# Patient Record
Sex: Female | Born: 1945 | Race: White | Hispanic: No | Smoking: Never smoker
Health system: Southern US, Community
[De-identification: ages and names within clinical notes are randomized; demographics above are authoritative.]

## PROBLEM LIST (undated history)

## (undated) DIAGNOSIS — D693 Immune thrombocytopenic purpura: Secondary | ICD-10-CM

## (undated) DIAGNOSIS — M810 Age-related osteoporosis without current pathological fracture: Secondary | ICD-10-CM

## (undated) DIAGNOSIS — H532 Diplopia: Secondary | ICD-10-CM

## (undated) DIAGNOSIS — E7800 Pure hypercholesterolemia, unspecified: Secondary | ICD-10-CM

## (undated) DIAGNOSIS — G629 Polyneuropathy, unspecified: Secondary | ICD-10-CM

## (undated) DIAGNOSIS — F419 Anxiety disorder, unspecified: Secondary | ICD-10-CM

## (undated) DIAGNOSIS — Z170 Estrogen receptor positive status [ER+]: Secondary | ICD-10-CM

## (undated) DIAGNOSIS — R42 Dizziness and giddiness: Secondary | ICD-10-CM

## (undated) DIAGNOSIS — M1990 Unspecified osteoarthritis, unspecified site: Secondary | ICD-10-CM

## (undated) DIAGNOSIS — R7303 Prediabetes: Secondary | ICD-10-CM

## (undated) DIAGNOSIS — C50412 Malignant neoplasm of upper-outer quadrant of left female breast: Secondary | ICD-10-CM

## (undated) HISTORY — DX: Malignant neoplasm of upper-outer quadrant of left female breast: C50.412

## (undated) HISTORY — DX: Anxiety disorder, unspecified: F41.9

## (undated) HISTORY — PX: EYE SURGERY: SHX253

## (undated) HISTORY — PX: SPLENECTOMY, TOTAL: SHX788

## (undated) HISTORY — DX: Diplopia: H53.2

## (undated) HISTORY — DX: Age-related osteoporosis without current pathological fracture: M81.0

## (undated) HISTORY — DX: Estrogen receptor positive status (ER+): Z17.0

## (undated) HISTORY — PX: BREAST LUMPECTOMY: SHX2

---

## 2004-02-28 ENCOUNTER — Ambulatory Visit: Payer: Self-pay | Admitting: Internal Medicine

## 2004-06-04 ENCOUNTER — Ambulatory Visit: Payer: Self-pay | Admitting: Internal Medicine

## 2004-06-30 ENCOUNTER — Ambulatory Visit: Payer: Self-pay | Admitting: Internal Medicine

## 2004-10-13 ENCOUNTER — Ambulatory Visit: Payer: Self-pay | Admitting: Internal Medicine

## 2004-10-28 ENCOUNTER — Ambulatory Visit: Payer: Self-pay | Admitting: Internal Medicine

## 2004-11-25 ENCOUNTER — Ambulatory Visit: Payer: Self-pay | Admitting: Gastroenterology

## 2005-04-12 ENCOUNTER — Ambulatory Visit: Payer: Self-pay | Admitting: Internal Medicine

## 2005-12-02 ENCOUNTER — Ambulatory Visit: Payer: Self-pay | Admitting: Gastroenterology

## 2007-02-28 ENCOUNTER — Ambulatory Visit: Payer: Self-pay | Admitting: Internal Medicine

## 2007-03-26 ENCOUNTER — Ambulatory Visit: Payer: Self-pay | Admitting: Internal Medicine

## 2007-03-31 ENCOUNTER — Ambulatory Visit: Payer: Self-pay | Admitting: Internal Medicine

## 2007-07-09 ENCOUNTER — Ambulatory Visit: Payer: Self-pay | Admitting: Unknown Physician Specialty

## 2008-07-21 ENCOUNTER — Ambulatory Visit: Payer: Self-pay | Admitting: Unknown Physician Specialty

## 2009-07-24 ENCOUNTER — Ambulatory Visit: Payer: Self-pay | Admitting: Unknown Physician Specialty

## 2010-08-05 ENCOUNTER — Ambulatory Visit: Payer: Self-pay | Admitting: Unknown Physician Specialty

## 2012-10-10 ENCOUNTER — Ambulatory Visit: Payer: Self-pay | Admitting: Family Medicine

## 2012-10-17 ENCOUNTER — Ambulatory Visit: Payer: Self-pay | Admitting: Family Medicine

## 2013-10-09 LAB — HM PAP SMEAR: HM PAP: NORMAL

## 2013-10-22 ENCOUNTER — Ambulatory Visit: Payer: Self-pay | Admitting: Family Medicine

## 2013-10-22 LAB — HM MAMMOGRAPHY: HM Mammogram: NORMAL

## 2014-04-22 ENCOUNTER — Ambulatory Visit: Payer: Self-pay | Admitting: Gastroenterology

## 2014-04-22 LAB — HM COLONOSCOPY

## 2014-08-04 LAB — HEPATIC FUNCTION PANEL
ALT: 16 U/L (ref 7–35)
AST: 19 U/L (ref 13–35)

## 2014-08-04 LAB — BASIC METABOLIC PANEL
BUN: 12 mg/dL (ref 4–21)
Creatinine: 0.7 mg/dL (ref <=1.1)
GLUCOSE: 95 mg/dL

## 2014-08-04 LAB — LIPID PANEL
Cholesterol: 212 mg/dL — AB (ref 0–200)
HDL: 58 mg/dL (ref 35–70)
LDL Cholesterol: 127 mg/dL
TRIGLYCERIDES: 136 mg/dL (ref 40–160)

## 2014-08-04 LAB — CBC AND DIFFERENTIAL: PLATELETS: 256 10*3/uL (ref 150–399)

## 2014-10-10 DIAGNOSIS — E7849 Other hyperlipidemia: Secondary | ICD-10-CM | POA: Insufficient documentation

## 2014-10-10 DIAGNOSIS — Z0000 Encounter for general adult medical examination without abnormal findings: Secondary | ICD-10-CM | POA: Insufficient documentation

## 2014-10-10 DIAGNOSIS — N393 Stress incontinence (female) (male): Secondary | ICD-10-CM | POA: Insufficient documentation

## 2014-10-10 DIAGNOSIS — D6832 Hemorrhagic disorder due to extrinsic circulating anticoagulants: Secondary | ICD-10-CM | POA: Insufficient documentation

## 2014-10-10 DIAGNOSIS — F410 Panic disorder [episodic paroxysmal anxiety] without agoraphobia: Secondary | ICD-10-CM | POA: Insufficient documentation

## 2014-10-10 DIAGNOSIS — K635 Polyp of colon: Secondary | ICD-10-CM | POA: Insufficient documentation

## 2014-10-13 ENCOUNTER — Other Ambulatory Visit: Payer: Self-pay | Admitting: Family Medicine

## 2014-10-13 DIAGNOSIS — Z1231 Encounter for screening mammogram for malignant neoplasm of breast: Secondary | ICD-10-CM

## 2014-10-15 ENCOUNTER — Other Ambulatory Visit: Payer: Self-pay | Admitting: Family Medicine

## 2014-10-15 DIAGNOSIS — Z780 Asymptomatic menopausal state: Secondary | ICD-10-CM

## 2014-11-11 ENCOUNTER — Ambulatory Visit: Payer: Self-pay

## 2014-11-11 ENCOUNTER — Ambulatory Visit
Admission: RE | Admit: 2014-11-11 | Discharge: 2014-11-11 | Disposition: A | Discharge status: Home / Self Care | Payer: Medicare Other | Source: Ambulatory Visit | Attending: Family Medicine | Admitting: Family Medicine

## 2014-11-11 DIAGNOSIS — Z780 Asymptomatic menopausal state: Secondary | ICD-10-CM | POA: Insufficient documentation

## 2014-11-11 DIAGNOSIS — Z1231 Encounter for screening mammogram for malignant neoplasm of breast: Secondary | ICD-10-CM

## 2014-12-05 ENCOUNTER — Other Ambulatory Visit: Payer: Self-pay | Admitting: Family Medicine

## 2014-12-05 DIAGNOSIS — Z79890 Hormone replacement therapy (postmenopausal): Secondary | ICD-10-CM

## 2014-12-05 DIAGNOSIS — R32 Unspecified urinary incontinence: Secondary | ICD-10-CM

## 2014-12-05 DIAGNOSIS — E785 Hyperlipidemia, unspecified: Secondary | ICD-10-CM

## 2015-03-13 ENCOUNTER — Other Ambulatory Visit: Payer: Self-pay | Admitting: Family Medicine

## 2015-06-28 ENCOUNTER — Ambulatory Visit
Admission: EM | Admit: 2015-06-28 | Discharge: 2015-06-28 | Disposition: A | Discharge status: Home / Self Care | Payer: Medicare Other | Attending: Family Medicine | Admitting: Family Medicine

## 2015-06-28 ENCOUNTER — Encounter: Payer: Self-pay | Admitting: Gynecology

## 2015-06-28 DIAGNOSIS — J029 Acute pharyngitis, unspecified: Secondary | ICD-10-CM

## 2015-06-28 DIAGNOSIS — J301 Allergic rhinitis due to pollen: Secondary | ICD-10-CM

## 2015-06-28 DIAGNOSIS — H6593 Unspecified nonsuppurative otitis media, bilateral: Secondary | ICD-10-CM | POA: Diagnosis not present

## 2015-06-28 HISTORY — DX: Immune thrombocytopenic purpura: D69.3

## 2015-06-28 HISTORY — DX: Polyneuropathy, unspecified: G62.9

## 2015-06-28 HISTORY — DX: Pure hypercholesterolemia, unspecified: E78.00

## 2015-06-28 LAB — RAPID STREP SCREEN (MED CTR MEBANE ONLY): Streptococcus, Group A Screen (Direct): NEGATIVE

## 2015-06-28 MED ORDER — SALINE SPRAY 0.65 % NA SOLN: 2.0000 | NASAL | Status: DC

## 2015-06-28 MED ORDER — FLUTICASONE PROPIONATE 50 MCG/ACT NA SUSP: 1.0000 | Freq: Two times a day (BID) | NASAL | Status: DC

## 2015-06-28 MED ORDER — AMOXICILLIN-POT CLAVULANATE 875-125 MG PO TABS: 1.0000 | ORAL_TABLET | Freq: Two times a day (BID) | ORAL | Status: DC

## 2015-06-28 MED ORDER — CETIRIZINE HCL 1 MG/ML PO SYRP: 10.0000 mg | ORAL_SOLUTION | Freq: Every day | ORAL | Status: DC

## 2015-06-28 MED ORDER — ACETAMINOPHEN 500 MG PO TABS: 1000.0000 mg | ORAL_TABLET | Freq: Four times a day (QID) | ORAL | Status: AC | PRN

## 2015-06-28 NOTE — ED Notes (Signed)
Please c/o sore throat x 4 days.

## 2015-06-28 NOTE — ED Provider Notes (Addendum)
CSN: [id number]     Arrival date & time 06/28/15  1442 History   First MD Initiated Contact with Patient 06/28/15 1517     Chief Complaint  Patient presents with  . Sore Throat   (Consider location/radiation/quality/duration/timing/severity/associated sxs/prior Treatment) HPI Comments: Widowed caucasian female here for evaluation of sore throat (mild honey helped), cough green productive today minimal.  Traveling out of town in 2 days to care for elderly aunt driving to destination wanted to ensure she wasn't contagious.  Denied sick contacts works at [employer] as Psychologist, occupational.  Allergies sulfa and macrobid, PMHx ITP, hyperlipidemia, anxiety PSHx splenectomy FHx colon cancer, heart problems  Patient is a 70 y.o. female presenting with pharyngitis. The history is provided by the patient.  Sore Throat Associated symptoms include headaches. Pertinent negatives include no chest pain, no abdominal pain and no shortness of breath.    Past Medical History  Diagnosis Date  . ITP (idiopathic thrombocytopenic purpura)   . Hypercholesteremia   . Neuropathy (Circle)     bilateral feet   Past Surgical History  Procedure Laterality Date  . Splenectomy, total     History reviewed. No pertinent family history. Social History  Substance Use Topics  . Smoking status: Never Smoker   . Smokeless tobacco: None  . Alcohol Use: No   OB History    No data available     Review of Systems  Constitutional: Negative for fever, chills, diaphoresis, activity change, appetite change, fatigue and unexpected weight change.  HENT: Positive for postnasal drip and sore throat. Negative for congestion, dental problem, drooling, ear discharge, ear pain, facial swelling, hearing loss, mouth sores, nosebleeds, rhinorrhea, sinus pressure, sneezing, tinnitus, trouble swallowing and voice change.   Eyes: Negative for photophobia, pain, discharge, redness, itching and visual disturbance.  Respiratory:  Positive for cough. Negative for choking, chest tightness, shortness of breath, wheezing and stridor.   Cardiovascular: Negative for chest pain, palpitations and leg swelling.  Gastrointestinal: Negative for nausea, vomiting, abdominal pain, diarrhea, constipation, blood in stool and abdominal distention.  Endocrine: Negative for cold intolerance and heat intolerance.  Genitourinary: Negative for dysuria, hematuria and difficulty urinating.  Musculoskeletal: Negative for myalgias, back pain, joint swelling, arthralgias, gait problem, neck pain and neck stiffness.  Skin: Negative for color change, pallor, rash and wound.  Allergic/Immunologic: Positive for environmental allergies. Negative for food allergies.  Neurological: Positive for headaches. Negative for dizziness, tremors, seizures, syncope, facial asymmetry, speech difficulty, weakness, light-headedness and numbness.  Hematological: Negative for adenopathy. Does not bruise/bleed easily.  Psychiatric/Behavioral: Negative for behavioral problems, confusion, sleep disturbance and agitation.    Allergies  Macrobid and Sulfa antibiotics  Home Medications   Prior to Admission medications   Medication Sig Start Date End Date Taking? Authorizing Provider  estradiol-norethindrone (ACTIVELLA) 1-0.5 MG per tablet TAKE (1) TABLET BY MOUTH EVERY DAY 12/05/14  Yes Juline Patch, MD  gabapentin (NEURONTIN) 300 MG capsule Take 1 capsule by mouth 2 (two) times daily. Manuella Ghazi 08/21/14  Yes Historical Provider, MD  Magnesium 100 MG CAPS Take 1 capsule by mouth daily.   Yes Historical Provider, MD  oxybutynin (DITROPAN-XL) 5 MG 24 hr tablet TAKE (1) TABLET BY MOUTH TWICE DAILY 03/13/15  Yes Juline Patch, MD  simvastatin (ZOCOR) 20 MG tablet TAKE (1) TABLET BY MOUTH DAILY AT BEDTIME 03/13/15  Yes Juline Patch, MD  acetaminophen (TYLENOL) 500 MG tablet Take 2 tablets (1,000 mg total) by mouth every 6 (six) hours as needed for  mild pain, moderate pain or  fever. 06/28/15 07/03/15  Olen Cordial, NP  ALPRAZolam Duanne Moron) 0.25 MG tablet Take 1 tablet by mouth as needed. 08/06/14   Historical Provider, MD  amoxicillin-clavulanate (AUGMENTIN) 875-125 MG tablet Take 1 tablet by mouth every 12 (twelve) hours. 06/28/15   Olen Cordial, NP  cetirizine (ZYRTEC) 1 MG/ML syrup Take 10 mLs (10 mg total) by mouth daily. 06/28/15 07/04/15  Olen Cordial, NP  fluticasone (FLONASE) 50 MCG/ACT nasal spray Place 1 spray into both nostrils 2 (two) times daily. 06/28/15   Olen Cordial, NP  sodium chloride (OCEAN) 0.65 % SOLN nasal spray Place 2 sprays into both nostrils every 2 (two) hours while awake. 06/28/15   Olen Cordial, NP   Meds Ordered and Administered this Visit  Medications - No data to display  BP 125/97 mmHg  Pulse 80  Temp(Src) 98.3 F (36.8 C) (Oral)  Resp 15  Ht 5\' 6"  (1.676 m)  Wt 150 lb (68.04 kg)  BMI 24.22 kg/m2  SpO2 97% No data found.   Physical Exam  Constitutional: She is oriented to person, place, and time. She appears well-developed and well-nourished. She is active and cooperative.  Non-toxic appearance. She does not have a sickly appearance. She does not appear ill. No distress.  HENT:  Head: Normocephalic and atraumatic.  Right Ear: Hearing, external ear and ear canal normal. A middle ear effusion is present.  Left Ear: Hearing, external ear and ear canal normal. A middle ear effusion is present.  Nose: Mucosal edema and rhinorrhea present. No nose lacerations, sinus tenderness, nasal deformity, septal deviation or nasal septal hematoma. No epistaxis.  No foreign bodies. Right sinus exhibits no maxillary sinus tenderness and no frontal sinus tenderness. Left sinus exhibits no maxillary sinus tenderness and no frontal sinus tenderness.  Mouth/Throat: Uvula is midline and mucous membranes are normal. Mucous membranes are not pale, not dry and not cyanotic. She does not have dentures. No oral lesions. No trismus in the jaw.  Normal dentition. No dental abscesses, uvula swelling, lacerations or dental caries. Posterior oropharyngeal edema and posterior oropharyngeal erythema present. No oropharyngeal exudate or tonsillar abscesses.  Cobblestoning posterior pharynx; bilateral TMs with air fluid level clear; bilateral nasal turbinates with edema/erythema/yellow discharge  Eyes: Conjunctivae, EOM and lids are normal. Pupils are equal, round, and reactive to light. Right eye exhibits no chemosis, no discharge, no exudate and no hordeolum. No foreign body present in the right eye. Left eye exhibits no chemosis, no discharge, no exudate and no hordeolum. No foreign body present in the left eye. Right conjunctiva is not injected. Right conjunctiva has no hemorrhage. Left conjunctiva is not injected. Left conjunctiva has no hemorrhage. No scleral icterus. Right eye exhibits normal extraocular motion and no nystagmus. Left eye exhibits normal extraocular motion and no nystagmus. Right pupil is round and reactive. Left pupil is round and reactive. Pupils are equal.  Neck: Trachea normal and normal range of motion. Neck supple. No tracheal tenderness, no spinous process tenderness and no muscular tenderness present. No rigidity. No tracheal deviation, no edema, no erythema and normal range of motion present. No thyroid mass and no thyromegaly present.  Cardiovascular: Normal rate, regular rhythm, S1 normal, S2 normal, normal heart sounds and intact distal pulses.  PMI is not displaced.  Exam reveals no gallop and no friction rub.   No murmur heard. Pulmonary/Chest: Effort normal and breath sounds normal. No accessory muscle usage or stridor. No respiratory distress. She has  no decreased breath sounds. She has no wheezes. She has no rhonchi. She has no rales. She exhibits no tenderness.  Abdominal: Soft. She exhibits no distension.  Musculoskeletal: Normal range of motion. She exhibits no edema or tenderness.       Right shoulder: Normal.        Left shoulder: Normal.       Right hip: Normal.       Left hip: Normal.       Right knee: Normal.       Left knee: Normal.       Cervical back: Normal.       Right hand: Normal.       Left hand: Normal.  Lymphadenopathy:       Head (right side): No submental, no submandibular, no tonsillar, no preauricular, no posterior auricular and no occipital adenopathy present.       Head (left side): No submental, no submandibular, no tonsillar, no preauricular, no posterior auricular and no occipital adenopathy present.    She has no cervical adenopathy.       Right cervical: No superficial cervical, no deep cervical and no posterior cervical adenopathy present.      Left cervical: No superficial cervical, no deep cervical and no posterior cervical adenopathy present.  Neurological: She is alert and oriented to person, place, and time. She has normal strength. She is not disoriented. She displays no atrophy and no tremor. No cranial nerve deficit or sensory deficit. She exhibits normal muscle tone. She displays no seizure activity. Coordination and gait normal. GCS eye subscore is 4. GCS verbal subscore is 5. GCS motor subscore is 6.  Skin: Skin is warm, dry and intact. No abrasion, no bruising, no burn, no ecchymosis, no laceration, no lesion, no petechiae and no rash noted. She is not diaphoretic. No cyanosis or erythema. No pallor. Nails show no clubbing.  Psychiatric: She has a normal mood and affect. Her speech is normal and behavior is normal. Judgment and thought content normal. Cognition and memory are normal.  Nursing note and vitals reviewed.   ED Course  Procedures (including critical care time)  Labs Review Labs Reviewed  RAPID STREP SCREEN (NOT AT [facility])  CULTURE, GROUP A STREP [facility])    Imaging Review No results found.  1530 discussed rapid strep negative throat culture pending typically 48 hours will call with results once available.  Patient given paper rx for augmentin  875mg  po BID x 10 days in case throat culture positive as traveling Tuesday out of town.  Patient verbalized understanding of information/instructions, agreed with plan of care and had no further questions at this time.  MDM   1. Acute pharyngitis, unspecified pharyngitis type   2. Otitis media with effusion, bilateral   3. Allergic rhinitis due to pollen    Supportive treatment.   No evidence of invasive bacterial infection, non toxic and well hydrated.  This is most likely self limiting viral infection.  I do not see where any further testing or imaging is necessary at this time.   I will suggest supportive care, rest, good hygiene and encourage the patient to take adequate fluids.  The patient is to return to clinic or EMERGENCY ROOM if symptoms worsen or change significantly e.g. ear pain, fever, purulent discharge from ears or bleeding.  Exitcare handout on otitis media with effusion given to patient.  Patient verbalized agreement and understanding of treatment plan.    Patient notified rapid strep negative.  Suspect Viral illness:  no evidence of invasive bacterial infection, non toxic and well hydrated.  This is most likely self limiting viral infection.  I do not see where any further testing or imaging is necessary at this time.   I will suggest supportive care, rest, good hygiene and encourage the patient to take adequate fluids.  Does not require work excuse.  Notified patient staff will call with culture results once available next 48+ hours. flonase 1 spray each nostril BID prn, nasal saline 1-2 sprays each nostril prn q2h, tylenol 1000mg  po QID prn avoid motrin/naproxen as can raise blood pressure  Zyrtec 10mg  po daily discussed can buy children's liquid and gargle swallow to help with sore throat.  Discussed honey with lemon and salt water gargles for comfort also.  The patient is to return to clinic or EMERGENCY ROOM if symptoms worsen or change significantly e.g. fever, lethargy, SOB,  wheezing.  Exitcare handout on viral illness given to patient.  Patient verbalized agreement and understanding of treatment plan.     Usually no specific medical treatment is needed if a virus is causing the sore throat.  The throat most often gets better on its own within 5 to 7 days.  Antibiotic medicine does not cure viral pharyngitis.   For acute pharyngitis caused by bacteria, your healthcare provider will prescribe an antibiotic.  Marland Kitchen Do not smoke.  Marland Kitchen Avoid secondhand smoke and other air pollutants.  . Use a cool mist humidifier to add moisture to the air.  . Get plenty of rest.  . You may want to rest your throat by talking less and eating a diet that is mostly liquid or soft for a day or two.   Marland Kitchen Nonprescription throat lozenges and mouthwashes should help relieve the soreness.   . Gargling with warm saltwater and drinking warm liquids may help.  (You can make a saltwater solution by adding 1/4 teaspoon of salt to 8 ounces, or 240 mL, of warm water.)  . A nonprescription pain reliever such as aspirin, acetaminophen, or ibuprofen may ease general aches and pains.   FOLLOW UP with clinic provider if no improvements in the next 7-10 days.  Exitcare handout on pharyngitis given to patient.  Patient verbalized understanding of instructions and agreed with plan of care. P2:  Hand washing and diet.  Plants budding in local area due to mild winter.  Patient with seasonal allergies past couple of years.  Typically doesn't take antihistamine.  Patient may use normal saline nasal spray as needed.  Consider antihistamine or nasal steroid use.  Avoid triggers if possible.  Shower prior to bedtime if exposed to triggers.  If allergic dust/dust mites recommend mattress/pillow covers/encasements; washing linens, vacuuming, sweeping, dusting weekly.  Call or return to clinic as needed if these symptoms worsen or fail to improve as anticipated.   Exitcare handout on allergic rhinitis given to patient.  Patient  verbalized understanding of instructions, agreed with plan of care and had no further questions at this time.  P2:  Avoidance and hand washing.   Olen Cordial, NP 06/28/15 1714  02 Jul 2015 1800 patient returned call stated was feeling better but hoarse voice again, nasal congestion and sore throat resolved but now nonproductive cough worse with lying down.  [place] and just returned home tonight.  Discussed if no improvement with flonase 1 spray each nostril BID, honey with lemon. claritin 10mg  po daily x 48 hours fill augmentin 875mg   po BID and start for sinusitis as postnasal drip continues despite antihistamine, nasal steroid, saline.  Patient to use humidifier also, OTC cough suppressant.  Patient verbalized understanding of information/instructions, agreed with plan of care and had no further questions at this time.  Olen Cordial, NP 07/02/15 [phone]

## 2015-06-28 NOTE — Discharge Instructions (Signed)
Allergic Rhinitis Allergic rhinitis is when the mucous membranes in the nose respond to allergens. Allergens are particles in the air that cause your body to have an allergic reaction. This causes you to release allergic antibodies. Through a chain of events, these eventually cause you to release histamine into the blood stream. Although meant to protect the body, it is this release of histamine that causes your discomfort, such as frequent sneezing, congestion, and an itchy, runny nose.  CAUSES Seasonal allergic rhinitis (hay fever) is caused by pollen allergens that may come from grasses, trees, and weeds. Year-round allergic rhinitis (perennial allergic rhinitis) is caused by allergens such as house dust mites, pet dander, and mold spores. SYMPTOMS  Nasal stuffiness (congestion).  Itchy, runny nose with sneezing and tearing of the eyes. DIAGNOSIS Your health care provider can help you determine the allergen or allergens that trigger your symptoms. If you and your health care provider are unable to determine the allergen, skin or blood testing may be used. Your health care provider will diagnose your condition after taking your health history and performing a physical exam. Your health care provider may assess you for other related conditions, such as asthma, pink eye, or an ear infection. TREATMENT Allergic rhinitis does not have a cure, but it can be controlled by:  Medicines that block allergy symptoms. These may include allergy shots, nasal sprays, and oral antihistamines.  Avoiding the allergen. Hay fever may often be treated with antihistamines in pill or nasal spray forms. Antihistamines block the effects of histamine. There are over-the-counter medicines that may help with nasal congestion and swelling around the eyes. Check with your health care provider before taking or giving this medicine. If avoiding the allergen or the medicine prescribed do not work, there are many new medicines  your health care provider can prescribe. Stronger medicine may be used if initial measures are ineffective. Desensitizing injections can be used if medicine and avoidance does not work. Desensitization is when a patient is given ongoing shots until the body becomes less sensitive to the allergen. Make sure you follow up with your health care provider if problems continue. HOME CARE INSTRUCTIONS It is not possible to completely avoid allergens, but you can reduce your symptoms by taking steps to limit your exposure to them. It helps to know exactly what you are allergic to so that you can avoid your specific triggers. SEEK MEDICAL CARE IF:  You have a fever.  You develop a cough that does not stop easily (persistent).  You have shortness of breath.  You start wheezing.  Symptoms interfere with normal daily activities.   This information is not intended to replace advice given to you by your health care provider. Make sure you discuss any questions you have with your health care provider.   Document Released: 02/08/2001 Document Revised: 06/06/2014 Document Reviewed: 01/21/2013 Elsevier Interactive Patient Education 2016 [place]. Otitis Media With Effusion Otitis media with effusion is the presence of fluid in the middle ear. This is a common problem in children, which often follows ear infections. It may be present for weeks or longer after the infection. Unlike an acute ear infection, otitis media with effusion refers only to fluid behind the ear drum and not infection. Children with repeated ear and sinus infections and allergy problems are the most likely to get otitis media with effusion. CAUSES  The most frequent cause of the fluid buildup is dysfunction of the eustachian tubes. These are the tubes that drain fluid  in the ears to the back of the nose (nasopharynx). SYMPTOMS   The main symptom of this condition is hearing loss. As a result, you or your child may:  Listen to the TV  at a loud volume.  Not respond to questions.  Ask "what" often when spoken to.  Mistake or confuse one sound or word for another.  There may be a sensation of fullness or pressure but usually not pain. DIAGNOSIS   Your health care provider will diagnose this condition by examining you or your child's ears.  Your health care provider may test the pressure in you or your child's ear with a tympanometer.  A hearing test may be conducted if the problem persists. TREATMENT   Treatment depends on the duration and the effects of the effusion.  Antibiotics, decongestants, nose drops, and cortisone-type drugs (tablets or nasal spray) may not be helpful.  Children with persistent ear effusions may have delayed language or behavioral problems. Children at risk for developmental delays in hearing, learning, and speech may require referral to a specialist earlier than children not at risk.  You or your child's health care provider may suggest a referral to an ear, nose, and throat surgeon for treatment. The following may help restore normal hearing:  Drainage of fluid.  Placement of ear tubes (tympanostomy tubes).  Removal of adenoids (adenoidectomy). HOME CARE INSTRUCTIONS   Avoid secondhand smoke.  Infants who are breastfed are less likely to have this condition.  Avoid feeding infants while they are lying flat.  Avoid known environmental allergens.  Avoid people who are sick. SEEK MEDICAL CARE IF:   Hearing is not better in 3 months.  Hearing is worse.  Ear pain.  Drainage from the ear.  Dizziness. MAKE SURE YOU:   Understand these instructions.  Will watch your condition.  Will get help right away if you are not doing well or get worse.   This information is not intended to replace advice given to you by your health care provider. Make sure you discuss any questions you have with your health care provider.   Document Released: 06/23/2004 Document Revised:  06/06/2014 Document Reviewed: 12/11/2012 Elsevier Interactive Patient Education 2016 [employer] Pharyngitis Pharyngitis is redness, pain, and swelling (inflammation) of your pharynx.  CAUSES  Pharyngitis is usually caused by infection. Most of the time, these infections are from viruses (viral) and are part of a cold. However, sometimes pharyngitis is caused by bacteria (bacterial). Pharyngitis can also be caused by allergies. Viral pharyngitis may be spread from person to person by coughing, sneezing, and personal items or utensils (cups, forks, spoons, toothbrushes). Bacterial pharyngitis may be spread from person to person by more intimate contact, such as kissing.  SIGNS AND SYMPTOMS  Symptoms of pharyngitis include:   Sore throat.   Tiredness (fatigue).   Low-grade fever.   Headache.  Joint pain and muscle aches.  Skin rashes.  Swollen lymph nodes.  Plaque-like film on throat or tonsils (often seen with bacterial pharyngitis). DIAGNOSIS  Your health care provider will ask you questions about your illness and your symptoms. Your medical history, along with a physical exam, is often all that is needed to diagnose pharyngitis. Sometimes, a rapid strep test is done. Other lab tests may also be done, depending on the suspected cause.  TREATMENT  Viral pharyngitis will usually get better in 3-4 days without the use of medicine. Bacterial pharyngitis is treated with medicines that kill germs (antibiotics).  HOME CARE INSTRUCTIONS  Drink enough water and fluids to keep your urine clear or pale yellow.   Only take over-the-counter or prescription medicines as directed by your health care provider:   If you are prescribed antibiotics, make sure you finish them even if you start to feel better.   Do not take aspirin.   Get lots of rest.   Gargle with 8 oz of salt water ( tsp of salt per 1 qt of water) as often as every 1-2 hours to soothe your throat.   Throat  lozenges (if you are not at risk for choking) or sprays may be used to soothe your throat. SEEK MEDICAL CARE IF:   You have large, tender lumps in your neck.  You have a rash.  You cough up green, yellow-brown, or bloody spit. SEEK IMMEDIATE MEDICAL CARE IF:   Your neck becomes stiff.  You drool or are unable to swallow liquids.  You vomit or are unable to keep medicines or liquids down.  You have severe pain that does not go away with the use of recommended medicines.  You have trouble breathing (not caused by a stuffy nose). MAKE SURE YOU:   Understand these instructions.  Will watch your condition.  Will get help right away if you are not doing well or get worse.   This information is not intended to replace advice given to you by your health care provider. Make sure you discuss any questions you have with your health care provider.   Document Released: 05/16/2005 Document Revised: 03/06/2013 Document Reviewed: 01/21/2013 Elsevier Interactive Patient Education [employer].

## 2015-06-30 LAB — CULTURE, GROUP A STREP (THRC)

## 2015-07-02 ENCOUNTER — Telehealth: Payer: Self-pay | Admitting: Family Medicine

## 2015-07-02 NOTE — Telephone Encounter (Signed)
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  Left message today throat culture normal/negative for strep throat.  To contact clinic if further questions or concerns

## 2015-07-03 DIAGNOSIS — H2513 Age-related nuclear cataract, bilateral: Secondary | ICD-10-CM | POA: Diagnosis not present

## 2015-07-09 ENCOUNTER — Encounter: Payer: Self-pay | Admitting: Family Medicine

## 2015-07-09 ENCOUNTER — Ambulatory Visit (INDEPENDENT_AMBULATORY_CARE_PROVIDER_SITE_OTHER): Payer: Medicare Other | Admitting: Family Medicine

## 2015-07-09 VITALS — BP 110/62 | HR 80 | Ht 66.0 in | Wt 151.0 lb

## 2015-07-09 DIAGNOSIS — S00532A Contusion of oral cavity, initial encounter: Secondary | ICD-10-CM | POA: Diagnosis not present

## 2015-07-09 DIAGNOSIS — D693 Immune thrombocytopenic purpura: Secondary | ICD-10-CM

## 2015-07-09 DIAGNOSIS — J0100 Acute maxillary sinusitis, unspecified: Secondary | ICD-10-CM

## 2015-07-09 DIAGNOSIS — B009 Herpesviral infection, unspecified: Secondary | ICD-10-CM

## 2015-07-09 MED ORDER — AMOXICILLIN 500 MG PO CAPS: 500.0000 mg | ORAL_CAPSULE | Freq: Three times a day (TID) | ORAL | Status: DC

## 2015-07-09 MED ORDER — ACYCLOVIR 200 MG PO CAPS: 200.0000 mg | ORAL_CAPSULE | Freq: Two times a day (BID) | ORAL | Status: DC

## 2015-07-09 NOTE — Progress Notes (Signed)
Name: Melissa Daniels   MRN: [id number]    DOB: 1945/08/01   Date:07/09/2015       Progress Note  Subjective  Chief Complaint  Chief Complaint  Patient presents with  . Oral Swelling    has a swollen place on L) side of lower lip- a little sore to touch    Oral Pain  This is a new problem. The current episode started today. The problem has been waxing and waning. The pain is mild. Associated symptoms include sinus pressure. Pertinent negatives include no difficulty swallowing, facial pain, fever or oral bleeding. She has tried acetaminophen for the symptoms. The treatment provided mild relief.    No problem-specific assessment & plan notes found for this encounter.   Past Medical History  Diagnosis Date  . ITP (idiopathic thrombocytopenic purpura)   . Hypercholesteremia   . 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 Osteoporosis     Past Surgical History  Procedure Laterality Date  . Splenectomy, total      History reviewed. No pertinent family history.  Social History   Social History  . Marital Status: Widowed    Spouse Name: N/A  . Number of Children: N/A  . Years of Education: N/A   Occupational History  . Not on file.   Social History Main Topics  . Smoking status: Never Smoker   . Smokeless tobacco: Not on file  . Alcohol Use: No  . Drug Use: No  . Sexual Activity: Not Currently   Other Topics Concern  . Not on file   Social History Narrative    Allergies  Allergen Reactions  . Macrobid [Nitrofurantoin]   . Sulfa Antibiotics      Review of Systems  Constitutional: Negative for fever, chills, weight loss and malaise/fatigue.  HENT: Positive for sinus pressure. Negative for ear discharge, ear pain and sore throat.   Eyes: Negative for blurred vision.  Respiratory: Negative for cough, sputum production, shortness of breath and wheezing.   Cardiovascular: Negative for chest pain, palpitations and leg swelling.  Gastrointestinal: Negative for heartburn,  nausea, abdominal pain, diarrhea, constipation, blood in stool and melena.  Genitourinary: Negative for dysuria, urgency, frequency and hematuria.  Musculoskeletal: Negative for myalgias, back pain, joint pain and neck pain.  Skin: Negative for rash.  Neurological: Negative for dizziness, tingling, sensory change, focal weakness and headaches.  Endo/Heme/Allergies: Negative for environmental allergies and polydipsia. Does not bruise/bleed easily.  Psychiatric/Behavioral: Negative for depression and suicidal ideas. The patient is not nervous/anxious and does not have insomnia.      Objective  Filed Vitals:   07/09/15 1446  BP: 110/62  Pulse: 80  Height: 5\' 6"  (1.676 m)  Weight: 151 lb (68.493 kg)    Physical Exam  Constitutional: She is well-developed, well-nourished, and in no distress. No distress.  HENT:  Head: Normocephalic and atraumatic.  Right Ear: External ear normal.  Left Ear: External ear normal.  Nose: Mucosal edema present.  Mouth/Throat: Oropharynx is clear and moist. Oral lesions present.  Swelling lingual left lower / nontender  Eyes: Conjunctivae and EOM are normal. Pupils are equal, round, and reactive to light. Right eye exhibits no discharge. Left eye exhibits no discharge.  Neck: Normal range of motion. Neck supple. No JVD present. No thyromegaly present.  Cardiovascular: Normal rate, regular rhythm, normal heart sounds and intact distal pulses.  Exam reveals no gallop and no friction rub.   No murmur heard. Pulmonary/Chest: Effort normal and breath sounds normal.  Abdominal: Soft. Bowel sounds are normal. She exhibits no mass. There is no tenderness. There is no guarding.  Musculoskeletal: Normal range of motion. She exhibits no edema.  Lymphadenopathy:    She has no cervical adenopathy.  Neurological: She is alert. She has normal reflexes.  Skin: Skin is warm and dry. She is not diaphoretic.  Psychiatric: Mood and affect normal.  Nursing note and vitals  reviewed.     Assessment & Plan  Problem List Items Addressed This Visit    None    Visit Diagnoses    Contusion of oral cavity, initial encounter    -  Primary    ice to mucosal aspect    Herpes simplex        Relevant Medications    acyclovir (ZOVIRAX) 200 MG capsule    Subacute maxillary sinusitis        Relevant Medications    amoxicillin (AMOXIL) 500 MG capsule    acyclovir (ZOVIRAX) 200 MG capsule    ITP (idiopathic thrombocytopenic purpura)             Dr. Eudora Guevarra [facility]  07/09/2015

## 2015-08-10 DIAGNOSIS — L821 Other seborrheic keratosis: Secondary | ICD-10-CM | POA: Diagnosis not present

## 2015-08-10 DIAGNOSIS — L570 Actinic keratosis: Secondary | ICD-10-CM | POA: Diagnosis not present

## 2015-08-10 DIAGNOSIS — L718 Other rosacea: Secondary | ICD-10-CM | POA: Diagnosis not present

## 2015-08-10 DIAGNOSIS — Z1283 Encounter for screening for malignant neoplasm of skin: Secondary | ICD-10-CM | POA: Diagnosis not present

## 2015-10-19 ENCOUNTER — Ambulatory Visit
Admission: RE | Admit: 2015-10-19 | Discharge: 2015-10-19 | Disposition: A | Discharge status: Home / Self Care | Payer: Medicare Other | Source: Ambulatory Visit | Attending: Family Medicine | Admitting: Family Medicine

## 2015-10-19 ENCOUNTER — Ambulatory Visit (INDEPENDENT_AMBULATORY_CARE_PROVIDER_SITE_OTHER): Payer: Medicare Other | Admitting: Family Medicine

## 2015-10-19 ENCOUNTER — Encounter: Payer: Self-pay | Admitting: Family Medicine

## 2015-10-19 VITALS — BP 130/70 | HR 76 | Ht 66.0 in | Wt 150.0 lb

## 2015-10-19 DIAGNOSIS — N393 Stress incontinence (female) (male): Secondary | ICD-10-CM

## 2015-10-19 DIAGNOSIS — I10 Essential (primary) hypertension: Secondary | ICD-10-CM | POA: Diagnosis not present

## 2015-10-19 DIAGNOSIS — R69 Illness, unspecified: Secondary | ICD-10-CM | POA: Diagnosis not present

## 2015-10-19 DIAGNOSIS — F410 Panic disorder [episodic paroxysmal anxiety] without agoraphobia: Secondary | ICD-10-CM

## 2015-10-19 DIAGNOSIS — Z1239 Encounter for other screening for malignant neoplasm of breast: Secondary | ICD-10-CM | POA: Diagnosis not present

## 2015-10-19 DIAGNOSIS — M79604 Pain in right leg: Secondary | ICD-10-CM

## 2015-10-19 DIAGNOSIS — E784 Other hyperlipidemia: Secondary | ICD-10-CM | POA: Diagnosis not present

## 2015-10-19 DIAGNOSIS — D693 Immune thrombocytopenic purpura: Secondary | ICD-10-CM

## 2015-10-19 DIAGNOSIS — E7849 Other hyperlipidemia: Secondary | ICD-10-CM

## 2015-10-19 DIAGNOSIS — Z23 Encounter for immunization: Secondary | ICD-10-CM | POA: Diagnosis not present

## 2015-10-19 DIAGNOSIS — Z780 Asymptomatic menopausal state: Secondary | ICD-10-CM

## 2015-10-19 DIAGNOSIS — Z0000 Encounter for general adult medical examination without abnormal findings: Secondary | ICD-10-CM | POA: Diagnosis not present

## 2015-10-19 DIAGNOSIS — M1611 Unilateral primary osteoarthritis, right hip: Secondary | ICD-10-CM | POA: Diagnosis not present

## 2015-10-19 DIAGNOSIS — Z79890 Hormone replacement therapy (postmenopausal): Secondary | ICD-10-CM | POA: Insufficient documentation

## 2015-10-19 DIAGNOSIS — W57XXXA Bitten or stung by nonvenomous insect and other nonvenomous arthropods, initial encounter: Secondary | ICD-10-CM

## 2015-10-19 MED ORDER — ESTRADIOL-NORETHINDRONE ACET 1-0.5 MG PO TABS: ORAL_TABLET | ORAL | Status: DC

## 2015-10-19 MED ORDER — ALPRAZOLAM 0.25 MG PO TABS: 0.2500 mg | ORAL_TABLET | ORAL | Status: DC | PRN

## 2015-10-19 MED ORDER — OXYBUTYNIN CHLORIDE ER 5 MG PO TB24: ORAL_TABLET | ORAL | Status: DC

## 2015-10-19 MED ORDER — SIMVASTATIN 20 MG PO TABS: ORAL_TABLET | ORAL | Status: DC

## 2015-10-19 MED ORDER — DOXYCYCLINE HYCLATE 100 MG PO TABS: 100.0000 mg | ORAL_TABLET | Freq: Two times a day (BID) | ORAL | Status: DC

## 2015-10-19 NOTE — Progress Notes (Signed)
Patient: Melissa Daniels, Female    DOB: 11/16/1945, 70 y.o.   MRN: [id number] Visit Date: 10/19/2015  Today's Provider: Otilio Miu, MD   Chief Complaint  Patient presents with  . Annual Exam    no pap  . Hyperlipidemia  . Anxiety  . Urinary Incontinence    takes oxybutinin   Subjective:   Initial preventative physical exam Melissa Daniels is a 70 y.o. female who presents today for her Initial Preventative Physical Exam. She feels well. She reports exercising . She reports she is sleeping well.  HPI Comments: Patient needs recheck for thrombocytopenia. Patient also removed a tick fromright groin area.   Hyperlipidemia This is a chronic problem. The current episode started more than 1 year ago. The problem is controlled. Recent lipid tests were reviewed and are normal. She has no history of chronic renal disease, diabetes, hypothyroidism, liver disease, obesity or nephrotic syndrome. There are no known factors aggravating her hyperlipidemia. Associated symptoms include myalgias. Pertinent negatives include no chest pain, focal sensory loss, focal weakness, leg pain or shortness of breath. Current antihyperlipidemic treatment includes statins. The current treatment provides moderate improvement of lipids. There are no compliance problems.  Risk factors for coronary artery disease include dyslipidemia and post-menopausal.  Anxiety Presents for follow-up visit. Symptoms include nervous/anxious behavior. Patient reports no chest pain, compulsions, confusion, decreased concentration, depressed mood, dizziness, dry mouth, excessive worry, feeling of choking, hyperventilation, impotence, insomnia, irritability, malaise, muscle tension, nausea, obsessions, palpitations, panic, restlessness, shortness of breath or suicidal ideas. Primary symptoms comment: for episodic circumstances ie funerals. Symptoms occur occasionally. The severity of symptoms is mild.   There are no known risk factors. Past treatments  include benzodiazephines.  Leg Pain  The incident occurred more than 1 week ago. There was no injury mechanism. The pain is present in the right thigh. The quality of the pain is described as aching. The pain is at a severity of 6/10. The pain is moderate. The pain has been intermittent since onset. Pertinent negatives include no inability to bear weight, loss of motion, loss of sensation, muscle weakness, numbness or tingling. Exacerbated by: crossing leg. She has tried acetaminophen for the symptoms. The treatment provided mild relief.    Review of Systems  Constitutional: Negative.  Negative for fever, chills, irritability, fatigue and unexpected weight change.  HENT: Negative for congestion, ear discharge, ear pain, rhinorrhea, sinus pressure, sneezing and sore throat.   Eyes: Negative for photophobia, pain, discharge, redness and itching.  Respiratory: Negative for cough, shortness of breath, wheezing and stridor.   Cardiovascular: Negative for chest pain and palpitations.  Gastrointestinal: Negative for nausea, vomiting, abdominal pain, diarrhea, constipation and blood in stool.  Endocrine: Negative for cold intolerance, heat intolerance, polydipsia, polyphagia and polyuria.  Genitourinary: Negative for dysuria, urgency, frequency, hematuria, flank pain, impotence, vaginal bleeding, vaginal discharge, menstrual problem and pelvic pain.  Musculoskeletal: Positive for myalgias. Negative for back pain and arthralgias.  Skin: Positive for wound. Negative for rash.       Tick bite  Allergic/Immunologic: Negative for environmental allergies and food allergies.  Neurological: Negative for dizziness, tingling, focal weakness, weakness, light-headedness, numbness and headaches.  Hematological: Negative for adenopathy. Bruises/bleeds easily.  Psychiatric/Behavioral: Negative for suicidal ideas, confusion, dysphoric mood and decreased concentration. The patient is nervous/anxious. The patient does not  have insomnia.     Social History   Social History  . Marital Status: Widowed    Spouse Name: N/A  . Number of Children: N/A  . Years  of Education: N/A   Occupational History  . Not on file.   Social History Main Topics  . Smoking status: Never Smoker   . Smokeless tobacco: Not on file  . Alcohol Use: No  . Drug Use: No  . Sexual Activity: Not Currently   Other Topics Concern  . Not on file   Social History Narrative    Patient Active Problem List   Diagnosis Date Noted  . 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ITP) 10/19/2015  . Menopause 10/19/2015  . Hormone replacement therapy (HRT) 10/19/2015  . 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 Routine general medical examination at a health care facility 10/10/2014  . 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 Female stress incontinence 10/10/2014  . Colon polyp 10/10/2014  . Episodic paroxysmal anxiety disorder 10/10/2014    Past Surgical History  Procedure Laterality Date  . Splenectomy, total      Her family history is not on file.    Previous Medications   CALCIUM CITRATE-VITAMIN D (CALCIUM + D PO)    Take 1 tablet by mouth daily.   CETIRIZINE (ZYRTEC) 1 MG/ML SYRUP    Take 10 mLs (10 mg total) by mouth daily.   GABAPENTIN (NEURONTIN) 300 MG CAPSULE    Take 1 capsule by mouth daily. Manuella Ghazi   MAGNESIUM 100 MG CAPS    Take 2 capsules by mouth daily.     Patient Care Team: Juline Patch, MD as PCP - General (Family Medicine)     Objective:   Vitals: BP 130/70 mmHg  Pulse 76  Ht 5\' 6"  (1.676 m)  Wt 150 lb (68.04 kg)  BMI 24.22 kg/m2  Physical Exam  Constitutional: She is oriented to person, place, and time. She appears well-developed and well-nourished.  HENT:  Head: Normocephalic.  Right Ear: External ear normal.  Left Ear: External ear normal.  Mouth/Throat: Oropharynx is clear and moist.  Eyes: Conjunctivae and EOM are normal. Pupils are equal, round, and reactive to light. Lids  are everted and swept, no foreign bodies found. Left eye exhibits no hordeolum. No foreign body present in the left eye. Right conjunctiva is not injected. Left conjunctiva is not injected. No scleral icterus.  Neck: Normal range of motion. Neck supple. No JVD present. No tracheal deviation present. No thyromegaly present.  Cardiovascular: Normal rate, regular rhythm, normal heart sounds and intact distal pulses.  Exam reveals no gallop and no friction rub.   No murmur heard. Pulmonary/Chest: Effort normal and breath sounds normal. No respiratory distress. She has no wheezes. She has no rales.  Abdominal: Soft. Bowel sounds are normal. She exhibits no mass. There is no hepatosplenomegaly. There is no tenderness. There is no rebound and no guarding.  Musculoskeletal: Normal range of motion. She exhibits no edema or tenderness.  Lymphadenopathy:    She has no cervical adenopathy.  Neurological: She is alert and oriented to person, place, and time. She has normal strength. She displays normal reflexes. No cranial nerve deficit.  Skin: Skin is warm. No rash noted. There is erythema.  Psychiatric: She has a normal mood and affect. Her mood appears not anxious. She does not exhibit a depressed mood.  Nursing note and vitals reviewed.    No exam data present  Activities of Daily Living In your present state of health, do you have any difficulty performing the following activities: 10/19/2015 07/09/2015  Hearing? N N  Vision? N N  Difficulty concentrating or making decisions? Y N  Walking or climbing stairs? N N  Dressing or bathing? N N  Doing errands, shopping? N N    Fall Risk Assessment Fall Risk  10/19/2015 07/09/2015  Falls in the past year? No No     Patient reports there are not safety devices in place in shower at home.   Depression Screen PHQ 2/9 Scores 10/19/2015 07/09/2015  PHQ - 2 Score 0 0    Cognitive Testing - 6-CIT   Correct? Score   What year is it? yes 0 Yes = 0    No = 4   What month is it? yes 0 Yes = 0    No = 3  Remember:     [address]ia [address]au, [place], [place]     What time is it? yes 0 Yes = 0    No = 3  Count backwards from 20 to 1 yes 0 Correct = 0    1 error = 2   More than 1 error = 4  Say the months of the year in reverse. yes 0 Correct = 0    1 error = 2   More than 1 error = 4  What address did I ask you to remember? yes 0 Correct = 0  1 error = 2    2 error = 4    3 error = 6    4 error = 8    All wrong = 10       TOTAL SCORE  0/28   Interpretation:  Normal  Normal (0-7) Abnormal (8-28)     Assessment & Plan:     Initial Preventative Physical Exam  Reviewed patient's Family Medical History Reviewed and updated list of patient's medical providers Assessment of cognitive impairment was done Assessed patient's functional ability Established a written schedule for health screening [place] Completed and Reviewed  Exercise Activities and Dietary recommendations Goals    None      Immunization History  Administered Date(s) Administered  . Influenza-Unspecified 01/29/2015  . Meningococcal Conjugate 08/17/2014  . Pneumococcal Conjugate-13 05/30/2013  . Pneumococcal Polysaccharide-23 04/02/2012  . Tdap 10/19/2015    Health Maintenance  Topic Date Due  . Hepatitis C Screening  Oct 13, 1945  . TETANUS/TDAP  11/14/1964  . INFLUENZA VACCINE  12/29/2015  . MAMMOGRAM  11/10/2016  . COLONOSCOPY  04/22/2024  . DEXA SCAN  Completed  . ZOSTAVAX  Addressed  . PNA vac Low Risk Adult  Completed      Discussed health benefits of physical activity, and encouraged her to engage in regular exercise appropriate for her age and condition.    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 incontinence   Relevant Medications   oxybutynin (DITROPAN-XL) 5 MG 24 hr tablet   Episodic paroxysmal anxiety disorder   Relevant Medications   ALPRAZolam (XANAX) 0.25 MG tablet   Menopause   Hormone replacement therapy (HRT)   Relevant Medications   estradiol-norethindrone (ACTIVELLA) 1-0.5 MG tablet    Other Visit Diagnoses    Medicare annual wellness visit, subsequent    -  Primary    Pain of right lower extremity        Relevant Orders    DG FEMUR MIN 2 VIEWS LEFT  Taking medication for chronic disease        Relevant Orders    Hepatic function panel    Renal Function Panel    Breast cancer screening        Relevant Orders    MM Digital Screening    Need for Tdap vaccination        Relevant Orders    Tdap vaccine greater than or equal to 7yo IM (Completed)    Tick bite        Relevant Medications    doxycycline (VIBRA-TABS) 100 MG tablet     More than 45 minutes was spent with patient discussing leg pain, need for TDAP and medication use. Patient also had a tick bite that was unroofed and dressed- antibiotic sent in   Otilio Miu, MD [facility]  10/19/2015

## 2015-10-19 NOTE — Addendum Note (Signed)
Addended by: Fredderick Severance on: 10/19/2015 10:22 AM   Modules accepted: Orders

## 2015-10-20 ENCOUNTER — Other Ambulatory Visit: Payer: Self-pay

## 2015-10-20 LAB — RENAL FUNCTION PANEL
Albumin: 4.3 g/dL (ref 3.6–4.8)
BUN / CREAT RATIO: 18 (ref 12–28)
BUN: 12 mg/dL (ref 8–27)
CALCIUM: 9.1 mg/dL (ref 8.7–10.3)
CHLORIDE: 100 mmol/L (ref 96–106)
CO2: 24 mmol/L (ref 18–29)
Creatinine, Ser: 0.65 mg/dL (ref 0.57–1.00)
GFR calc non Af Amer: 91 mL/min/{1.73_m2} (ref >59)
GFR, EST AFRICAN AMERICAN: 105 mL/min/{1.73_m2} (ref >59)
GLUCOSE: 76 mg/dL (ref 65–99)
POTASSIUM: 4.7 mmol/L (ref 3.5–5.2)
Phosphorus: 2.7 mg/dL (ref 2.5–4.5)
SODIUM: 144 mmol/L (ref 134–144)

## 2015-10-20 LAB — HEPATIC FUNCTION PANEL
ALK PHOS: 56 IU/L (ref 39–117)
ALT: 16 IU/L (ref 0–32)
AST: 20 IU/L (ref 0–40)
Bilirubin Total: 0.4 mg/dL (ref 0.0–1.2)
Bilirubin, Direct: 0.12 mg/dL (ref 0.00–0.40)
TOTAL PROTEIN: 6.9 g/dL (ref 6.0–8.5)

## 2015-10-20 LAB — CBC
HEMOGLOBIN: 13.6 g/dL (ref 11.1–15.9)
Hematocrit: 41.7 % (ref 34.0–46.6)
MCH: 31.1 pg (ref 26.6–33.0)
MCHC: 32.6 g/dL (ref 31.5–35.7)
MCV: 95 fL (ref 79–97)
Platelets: 232 10*3/uL (ref 150–379)
RBC: 4.38 x10E6/uL (ref 3.77–5.28)
RDW: 14.2 % (ref 12.3–15.4)
WBC: 6.1 10*3/uL (ref 3.4–10.8)

## 2015-10-22 ENCOUNTER — Other Ambulatory Visit: Payer: Self-pay

## 2015-10-22 LAB — LIPID PANEL WITH LDL/HDL RATIO
CHOLESTEROL TOTAL: 175 mg/dL (ref 100–199)
HDL: 52 mg/dL (ref >39)
LDL CALC: 98 mg/dL (ref 0–99)
LDL/HDL RATIO: 1.9 ratio (ref 0.0–3.2)
Triglycerides: 124 mg/dL (ref 0–149)
VLDL Cholesterol Cal: 25 mg/dL (ref 5–40)

## 2015-10-22 LAB — SPECIMEN STATUS REPORT

## 2015-11-16 ENCOUNTER — Ambulatory Visit: Payer: Medicare Other

## 2015-11-23 ENCOUNTER — Ambulatory Visit: Payer: Medicare Other

## 2015-12-07 ENCOUNTER — Ambulatory Visit
Admission: RE | Admit: 2015-12-07 | Discharge: 2015-12-07 | Disposition: A | Discharge status: Home / Self Care | Payer: Medicare Other | Source: Ambulatory Visit | Attending: Family Medicine | Admitting: Family Medicine

## 2015-12-07 DIAGNOSIS — Z1239 Encounter for other screening for malignant neoplasm of breast: Secondary | ICD-10-CM | POA: Insufficient documentation

## 2015-12-07 DIAGNOSIS — Z1231 Encounter for screening mammogram for malignant neoplasm of breast: Secondary | ICD-10-CM | POA: Insufficient documentation

## 2016-01-06 ENCOUNTER — Other Ambulatory Visit: Payer: Self-pay

## 2016-01-06 DIAGNOSIS — N393 Stress incontinence (female) (male): Secondary | ICD-10-CM

## 2016-01-06 MED ORDER — OXYBUTYNIN CHLORIDE ER 10 MG PO TB24: ORAL_TABLET | ORAL | 0 refills | Status: DC

## 2016-01-07 MED ORDER — OXYBUTYNIN CHLORIDE ER 10 MG PO TB24: 10.0000 mg | ORAL_TABLET | Freq: Every day | ORAL | 0 refills | Status: DC

## 2016-02-26 DIAGNOSIS — H2513 Age-related nuclear cataract, bilateral: Secondary | ICD-10-CM | POA: Diagnosis not present

## 2016-07-04 ENCOUNTER — Other Ambulatory Visit: Payer: Self-pay

## 2016-08-01 ENCOUNTER — Ambulatory Visit (INDEPENDENT_AMBULATORY_CARE_PROVIDER_SITE_OTHER): Payer: Medicare Other | Admitting: Family Medicine

## 2016-08-01 ENCOUNTER — Encounter: Payer: Self-pay | Admitting: Family Medicine

## 2016-08-01 VITALS — BP 100/80 | HR 72 | Ht 66.0 in | Wt 148.0 lb

## 2016-08-01 DIAGNOSIS — E784 Other hyperlipidemia: Secondary | ICD-10-CM

## 2016-08-01 DIAGNOSIS — Z79890 Hormone replacement therapy (postmenopausal): Secondary | ICD-10-CM | POA: Diagnosis not present

## 2016-08-01 DIAGNOSIS — M79651 Pain in right thigh: Secondary | ICD-10-CM | POA: Diagnosis not present

## 2016-08-01 DIAGNOSIS — G629 Polyneuropathy, unspecified: Secondary | ICD-10-CM

## 2016-08-01 DIAGNOSIS — F410 Panic disorder [episodic paroxysmal anxiety] without agoraphobia: Secondary | ICD-10-CM

## 2016-08-01 DIAGNOSIS — N393 Stress incontinence (female) (male): Secondary | ICD-10-CM

## 2016-08-01 DIAGNOSIS — E7849 Other hyperlipidemia: Secondary | ICD-10-CM

## 2016-08-01 MED ORDER — ESTRADIOL-NORETHINDRONE ACET 1-0.5 MG PO TABS: ORAL_TABLET | ORAL | 3 refills | Status: DC

## 2016-08-01 MED ORDER — OXYBUTYNIN CHLORIDE ER 10 MG PO TB24
10.0000 mg | ORAL_TABLET | Freq: Every day | ORAL | 3 refills | Status: DC
Start: 2016-08-01 — End: 2017-07-14

## 2016-08-01 MED ORDER — ALPRAZOLAM 0.25 MG PO TABS: 0.2500 mg | ORAL_TABLET | ORAL | 0 refills | Status: DC | PRN

## 2016-08-01 MED ORDER — SIMVASTATIN 20 MG PO TABS: ORAL_TABLET | ORAL | 3 refills | Status: DC

## 2016-08-01 MED ORDER — GABAPENTIN 300 MG PO CAPS: 300.0000 mg | ORAL_CAPSULE | Freq: Two times a day (BID) | ORAL | 1 refills | Status: DC

## 2016-08-01 MED ORDER — GABAPENTIN 300 MG PO CAPS: 300.0000 mg | ORAL_CAPSULE | Freq: Every day | ORAL | 3 refills | Status: DC

## 2016-08-01 NOTE — Progress Notes (Signed)
Name: Melissa Daniels   MRN: [id number]    DOB: [birth year]   Date:08/01/2016       Progress Note  Subjective  Chief Complaint  Chief Complaint  Patient presents with  . Hyperlipidemia  . Urinary Incontinence    takes oxybutinin  . hormone replacement therapy  . Peripheral Neuropathy    takes gabapentin- normally gets from Dr Manuella Ghazi- wants to know if you can take this over  . Anxiety    Hyperlipidemia  This is a new problem. The current episode started more than 1 year ago. The problem is controlled. Recent lipid tests were reviewed and are normal. She has no history of chronic renal disease, diabetes, hypothyroidism, liver disease, obesity or nephrotic syndrome. Factors aggravating her hyperlipidemia include estrogen. Pertinent negatives include no chest pain, focal sensory loss, focal weakness, leg pain, myalgias or shortness of breath. She is currently on no antihyperlipidemic treatment. The current treatment provides no improvement of lipids. There are no compliance problems.  Risk factors for coronary artery disease include dyslipidemia.  Anxiety  Presents for follow-up visit. Symptoms include excessive worry and nervous/anxious behavior. Patient reports no chest pain, compulsions, confusion, decreased concentration, depressed mood, dizziness, dry mouth, feeling of choking, hyperventilation, impotence, insomnia, irritability, malaise, muscle tension, nausea, obsessions, palpitations, panic, restlessness, shortness of breath or suicidal ideas. Primary symptoms comment: neuropathy/ chemtherapy. Symptoms occur occasionally. The severity of symptoms is moderate. The quality of sleep is good. Nighttime awakenings: none.   (Chemotherapy) Compliance with medications is 76-100%.  Neurologic Problem  The patient's pertinent negatives include no altered mental status, clumsiness, focal sensory loss, focal weakness, loss of balance, memory loss, near-syncope, slurred speech, syncope, visual change or  weakness. Primary symptoms comment: neuropathy/ chemtherapy. This is a new problem. The current episode started more than 1 year ago. The problem has been waxing and waning since onset. Associated symptoms include bladder incontinence. Pertinent negatives include no abdominal pain, back pain, chest pain, confusion, dizziness, fever, headaches, nausea, neck pain, palpitations or shortness of breath. (Feet burning) Treatments tried: gabapentin. The treatment provided moderate relief. There is no history of liver disease. (Chemotherapy)  Female GU Problem  The patient's primary symptoms include genital itching. The patient's pertinent negatives include no genital lesions, genital odor, genital rash, missed menses, pelvic pain, vaginal bleeding or vaginal discharge. Primary symptoms comment: neuropathy/ chemtherapy. This is a chronic problem. The current episode started more than 1 year ago. The problem occurs intermittently. The problem has been waxing and waning. The patient is experiencing no pain. Pertinent negatives include no abdominal pain, back pain, chills, constipation, diarrhea, dysuria, fever, frequency, headaches, hematuria, joint pain, nausea, rash, sore throat or urgency. Treatments tried: HRT/estrogen cream. There is no history of an abdominal surgery, endometriosis or a gynecological surgery. (Chemotherapy)  Leg Pain   The incident occurred more than 1 week ago. There was no injury mechanism. The pain is present in the right thigh. The quality of the pain is described as aching. The pain is at a severity of 3/10. The pain has been intermittent since onset. Pertinent negatives include no tingling. Associated symptoms comments: Unable to cross leg. Exacerbated by: crossing right leg. She has tried NSAIDs for the symptoms. The treatment provided moderate relief.    No problem-specific Assessment & Plan notes found for this encounter.   Past Medical History:  Diagnosis Date  . Anxiety   .  Hypercholesteremia   . ITP (idiopathic thrombocytopenic purpura)   . Neuropathy ([place])    bilateral feet  .  Osteoporosis     Past Surgical History:  Procedure Laterality Date  . SPLENECTOMY, TOTAL      Family History  Problem Relation Age of Onset  . Breast cancer Neg Hx     Social History   Social History  . Marital status: Widowed    Spouse name: N/A  . Number of children: N/A  . Years of education: N/A   Occupational History  . Not on file.   Social History Main Topics  . Smoking status: Never Smoker  . Smokeless tobacco: Never Used  . Alcohol use No  . Drug use: No  . Sexual activity: Not Currently   Other Topics Concern  . Not on file   Social History Narrative  . No narrative on file    Allergies  Allergen Reactions  . Macrobid [Nitrofurantoin]   . Sulfa Antibiotics     Outpatient Medications Prior to Visit  Medication Sig Dispense Refill  . Calcium Citrate-Vitamin D (CALCIUM + D PO) Take 1 tablet by mouth 2 (two) times daily.    . Magnesium 100 MG CAPS Take 2 capsules by mouth daily.     Marland Kitchen ALPRAZolam (XANAX) 0.25 MG tablet Take 1 tablet (0.25 mg total) by mouth as needed. 30 tablet 0  . estradiol-norethindrone (ACTIVELLA) 1-0.5 MG tablet TAKE (1) TABLET BY MOUTH EVERY DAY 90 tablet 1  . gabapentin (NEURONTIN) 300 MG capsule Take 1 capsule by mouth daily. Manuella Ghazi    . oxybutynin (DITROPAN-XL) 10 MG 24 hr tablet Take 1 tablet (10 mg total) by mouth at bedtime. 1 daily 90 tablet 0  . simvastatin (ZOCOR) 20 MG tablet TAKE (1) TABLET BY MOUTH DAILY AT BEDTIME 90 tablet 1  . cetirizine (ZYRTEC) 1 MG/ML syrup Take 10 mLs (10 mg total) by mouth daily. 118 mL 12  . doxycycline (VIBRA-TABS) 100 MG tablet Take 1 tablet (100 mg total) by mouth 2 (two) times daily. 20 tablet 0   No facility-administered medications prior to visit.     Review of Systems  Constitutional: Negative for chills, fever, irritability, malaise/fatigue and weight loss.  HENT: Negative for  ear discharge, ear pain and sore throat.   Eyes: Negative for blurred vision.  Respiratory: Negative for cough, sputum production, shortness of breath and wheezing.   Cardiovascular: Negative for chest pain, palpitations, leg swelling and near-syncope.  Gastrointestinal: Negative for abdominal pain, blood in stool, constipation, diarrhea, heartburn, melena and nausea.  Genitourinary: Positive for bladder incontinence. Negative for dysuria, frequency, hematuria, impotence, missed menses, pelvic pain, urgency and vaginal discharge.  Musculoskeletal: Negative for back pain, joint pain, myalgias and neck pain.  Skin: Negative for rash.  Neurological: Negative for dizziness, tingling, sensory change, focal weakness, syncope, weakness, headaches and loss of balance.  Endo/Heme/Allergies: Negative for environmental allergies and polydipsia. Does not bruise/bleed easily.  Psychiatric/Behavioral: Negative for confusion, decreased concentration, depression, memory loss and suicidal ideas. The patient is nervous/anxious. The patient does not have insomnia.      Objective  Vitals:   08/01/16 0758  BP: 100/80  Pulse: 72  Weight: 148 lb (67.1 kg)  Height: 5\' 6"  (1.676 m)    Physical Exam  Constitutional: She is well-developed, well-nourished, and in no distress. No distress.  HENT:  Head: Normocephalic and atraumatic.  Right Ear: External ear normal.  Left Ear: External ear normal.  Nose: Nose normal.  Mouth/Throat: Oropharynx is clear and moist.  Eyes: Conjunctivae and EOM are normal. Pupils are equal, round, and reactive to light. Right eye  exhibits no discharge. Left eye exhibits no discharge.  Neck: Normal range of motion. Neck supple. No JVD present. No thyromegaly present.  Cardiovascular: Normal rate, regular rhythm, normal heart sounds and intact distal pulses.  Exam reveals no gallop and no friction rub.   No murmur heard. Pulmonary/Chest: Effort normal and breath sounds normal. No  respiratory distress. She has no wheezes. She has no rales. She exhibits no tenderness.  Abdominal: Soft. Bowel sounds are normal. She exhibits no mass. There is no tenderness. There is no guarding.  Musculoskeletal: Normal range of motion. She exhibits no edema.  Lymphadenopathy:    She has no cervical adenopathy.  Neurological: She is alert. She has normal reflexes.  Skin: Skin is warm and dry. She is not diaphoretic.  Psychiatric: Mood and affect normal.  Nursing note and vitals reviewed.     Assessment & Plan  Problem List Items Addressed This Visit      Other   Familial multiple lipoprotein-type hyperlipidemia   Relevant Medications   simvastatin (ZOCOR) 20 MG tablet   Female stress incontinence   Relevant Medications   oxybutynin (DITROPAN-XL) 10 MG 24 hr tablet   Episodic paroxysmal anxiety disorder   Relevant Medications   ALPRAZolam (XANAX) 0.25 MG tablet   Hormone replacement therapy (HRT)   Relevant Medications   estradiol-norethindrone (ACTIVELLA) 1-0.5 MG tablet    Other Visit Diagnoses    Neuropathy (HCC)    -  Primary   Relevant Medications   gabapentin (NEURONTIN) 300 MG capsule   Pain of right thigh       ibuprofen prn      Meds ordered this encounter  Medications  . ALPRAZolam (XANAX) 0.25 MG tablet    Sig: Take 1 tablet (0.25 mg total) by mouth as needed.    Dispense:  30 tablet    Refill:  0  . simvastatin (ZOCOR) 20 MG tablet    Sig: TAKE (1) TABLET BY MOUTH DAILY AT BEDTIME    Dispense:  90 tablet    Refill:  3  . estradiol-norethindrone (ACTIVELLA) 1-0.5 MG tablet    Sig: TAKE (1) TABLET BY MOUTH EVERY DAY    Dispense:  90 tablet    Refill:  3  . oxybutynin (DITROPAN-XL) 10 MG 24 hr tablet    Sig: Take 1 tablet (10 mg total) by mouth at bedtime. 1 daily    Dispense:  90 tablet    Refill:  3  . DISCONTD: gabapentin (NEURONTIN) 300 MG capsule    Sig: Take 1 capsule (300 mg total) by mouth daily. Manuella Ghazi    Dispense:  90 capsule    Refill:   3  . gabapentin (NEURONTIN) 300 MG capsule    Sig: Take 1 capsule (300 mg total) by mouth 2 (two) times daily. Manuella Ghazi    Dispense:  180 capsule    Refill:  1    Pt states shes been on bid      Dr. Otilio Miu [facility]  08/01/16

## 2016-08-16 DIAGNOSIS — L570 Actinic keratosis: Secondary | ICD-10-CM | POA: Diagnosis not present

## 2016-08-16 DIAGNOSIS — C4490 Unspecified malignant neoplasm of skin, unspecified: Secondary | ICD-10-CM | POA: Diagnosis not present

## 2016-08-16 DIAGNOSIS — Z872 Personal history of diseases of the skin and subcutaneous tissue: Secondary | ICD-10-CM | POA: Diagnosis not present

## 2016-10-31 ENCOUNTER — Encounter: Payer: Self-pay | Admitting: Family Medicine

## 2016-10-31 ENCOUNTER — Ambulatory Visit: Payer: Medicare Other | Admitting: Family Medicine

## 2016-10-31 ENCOUNTER — Ambulatory Visit (INDEPENDENT_AMBULATORY_CARE_PROVIDER_SITE_OTHER): Payer: Medicare Other | Admitting: Family Medicine

## 2016-10-31 VITALS — BP 130/80 | HR 60 | Ht 66.0 in | Wt 149.0 lb

## 2016-10-31 DIAGNOSIS — D693 Immune thrombocytopenic purpura: Secondary | ICD-10-CM | POA: Diagnosis not present

## 2016-10-31 DIAGNOSIS — E7849 Other hyperlipidemia: Secondary | ICD-10-CM

## 2016-10-31 DIAGNOSIS — G629 Polyneuropathy, unspecified: Secondary | ICD-10-CM

## 2016-10-31 DIAGNOSIS — Z1231 Encounter for screening mammogram for malignant neoplasm of breast: Secondary | ICD-10-CM | POA: Diagnosis not present

## 2016-10-31 DIAGNOSIS — R69 Illness, unspecified: Secondary | ICD-10-CM | POA: Diagnosis not present

## 2016-10-31 DIAGNOSIS — Z79890 Hormone replacement therapy (postmenopausal): Secondary | ICD-10-CM | POA: Diagnosis not present

## 2016-10-31 DIAGNOSIS — E784 Other hyperlipidemia: Secondary | ICD-10-CM

## 2016-10-31 DIAGNOSIS — Z1239 Encounter for other screening for malignant neoplasm of breast: Secondary | ICD-10-CM

## 2016-10-31 DIAGNOSIS — M1611 Unilateral primary osteoarthritis, right hip: Secondary | ICD-10-CM | POA: Diagnosis not present

## 2016-10-31 DIAGNOSIS — Z1159 Encounter for screening for other viral diseases: Secondary | ICD-10-CM

## 2016-10-31 NOTE — Progress Notes (Signed)
Name: Melissa Daniels   MRN: [id number]    DOB: [birth year]   Date:10/31/2016       Progress Note  Subjective  Chief Complaint  Chief Complaint  Patient presents with  . Annual Exam    mammo due in July    Patient presents for annual physical exam.. Only complaint concerns pain in the right buttock/hip area  Patient needs lab for maintenance.    No problem-specific Assessment & Plan notes found for this encounter.   Past Medical History:  Diagnosis Date  . Anxiety   . Hypercholesteremia   . ITP (idiopathic thrombocytopenic purpura)   . Neuropathy    bilateral feet  . Osteoporosis     Past Surgical History:  Procedure Laterality Date  . SPLENECTOMY, TOTAL      Family History  Problem Relation Age of Onset  . Breast cancer Neg Hx     Social History   Social History  . Marital status: Widowed    Spouse name: N/A  . Number of children: N/A  . Years of education: N/A   Occupational History  . Not on file.   Social History Main Topics  . Smoking status: Never Smoker  . Smokeless tobacco: Never Used  . Alcohol use No  . Drug use: No  . Sexual activity: Not Currently   Other Topics Concern  . Not on file   Social History Narrative  . No narrative on file    Allergies  Allergen Reactions  . Macrobid [Nitrofurantoin]   . Sulfa Antibiotics     Outpatient Medications Prior to Visit  Medication Sig Dispense Refill  . Calcium Citrate-Vitamin D (CALCIUM + D PO) Take 1 tablet by mouth 2 (two) times daily.    Marland Kitchen estradiol-norethindrone (ACTIVELLA) 1-0.5 MG tablet TAKE (1) TABLET BY MOUTH EVERY DAY 90 tablet 3  . gabapentin (NEURONTIN) 300 MG capsule Take 1 capsule (300 mg total) by mouth 2 (two) times daily. Shah 180 capsule 1  . Magnesium 100 MG CAPS Take 2 capsules by mouth daily.     Marland Kitchen oxybutynin (DITROPAN-XL) 10 MG 24 hr tablet Take 1 tablet (10 mg total) by mouth at bedtime. 1 daily 90 tablet 3  . simvastatin (ZOCOR) 20 MG tablet TAKE (1) TABLET BY MOUTH DAILY  AT BEDTIME 90 tablet 3  . ALPRAZolam (XANAX) 0.25 MG tablet Take 1 tablet (0.25 mg total) by mouth as needed. (Patient not taking: Reported on 10/31/2016) 30 tablet 0   No facility-administered medications prior to visit.     Review of Systems  Constitutional: Negative for chills, fever, malaise/fatigue and weight loss.  HENT: Negative for ear discharge, ear pain and sore throat.   Eyes: Negative for blurred vision.  Respiratory: Negative for cough, sputum production, shortness of breath and wheezing.   Cardiovascular: Negative for chest pain, palpitations and leg swelling.  Gastrointestinal: Negative for abdominal pain, blood in stool, constipation, diarrhea, heartburn, melena and nausea.  Genitourinary: Negative for dysuria, frequency, hematuria and urgency.  Musculoskeletal: Positive for joint pain. Negative for back pain, myalgias and neck pain.  Skin: Negative for rash.  Neurological: Negative for dizziness, tingling, sensory change, focal weakness and headaches.  Endo/Heme/Allergies: Negative for environmental allergies and polydipsia. Does not bruise/bleed easily.  Psychiatric/Behavioral: Negative for depression and suicidal ideas. The patient is not nervous/anxious and does not have insomnia.      Objective  Vitals:   10/31/16 0832  BP: 130/80  Pulse: 60  Weight: 149 lb (67.6 kg)  Height: 5'  6" (1.676 m)    Physical Exam  Constitutional: She is well-developed, well-nourished, and in no distress. No distress.  HENT:  Head: Normocephalic and atraumatic.  Right Ear: External ear normal.  Left Ear: External ear normal.  Nose: Nose normal.  Mouth/Throat: Oropharynx is clear and moist.  Eyes: Conjunctivae and EOM are normal. Pupils are equal, round, and reactive to light. Right eye exhibits no discharge. Left eye exhibits no discharge.  Neck: Normal range of motion. Neck supple. No JVD present. No thyromegaly present.  Cardiovascular: Normal rate, regular rhythm, normal  heart sounds and intact distal pulses.  Exam reveals no gallop and no friction rub.   No murmur heard. Pulmonary/Chest: Effort normal and breath sounds normal. She has no wheezes. She has no rales. Right breast exhibits no inverted nipple, no mass, no nipple discharge, no skin change and no tenderness. Left breast exhibits no inverted nipple, no mass, no nipple discharge, no skin change and no tenderness. Breasts are symmetrical.  Abdominal: Soft. Bowel sounds are normal. She exhibits no mass. There is no tenderness. There is no guarding.  Musculoskeletal: Normal range of motion. She exhibits no edema.  Lymphadenopathy:    She has no cervical adenopathy.  Neurological: She is alert. She has normal reflexes.  Skin: Skin is warm and dry. She is not diaphoretic.  Psychiatric: Mood and affect normal.  Nursing note and vitals reviewed.     Assessment & Plan  Problem List Items Addressed This Visit      Nervous and Auditory   Neuropathy   Relevant Orders   Renal Function Panel   Hepatic Function Panel (6)     Musculoskeletal and Integument   Primary osteoarthritis of right hip - Primary   Relevant Orders   Ambulatory referral to Orthopedic Surgery     Hematopoietic and Hemostatic   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[place])   Relevant Orders   CBC w/Diff/Platelet     Other   Familial multiple lipoprotein-type hyperlipidemia   Relevant Orders   Renal Function Panel   Lipid Profile   Hormone replacement therapy (HRT)   Taking medication for chronic disease   Relevant Orders   Renal Function Panel   Hepatic Function Panel (6)    Other Visit Diagnoses    Need for hepatitis C screening test       Relevant Orders   Hepatitis C antibody   Breast cancer screening       Relevant Orders   MM Digital Screening      No orders of the defined types were placed in this encounter.     Dr. [facility]  10/31/16

## 2016-11-01 LAB — RENAL FUNCTION PANEL
Albumin: 4.3 g/dL (ref 3.5–4.8)
BUN/Creatinine Ratio: 19 (ref 12–28)
BUN: 15 mg/dL (ref 8–27)
CALCIUM: 9.2 mg/dL (ref 8.7–10.3)
CHLORIDE: 104 mmol/L (ref 96–106)
CO2: 26 mmol/L (ref 18–29)
Creatinine, Ser: 0.78 mg/dL (ref 0.57–1.00)
GFR calc Af Amer: 89 mL/min/{1.73_m2} (ref >59)
GFR, EST NON AFRICAN AMERICAN: 77 mL/min/{1.73_m2} (ref >59)
GLUCOSE: 90 mg/dL (ref 65–99)
PHOSPHORUS: 3 mg/dL (ref 2.5–4.5)
POTASSIUM: 4.5 mmol/L (ref 3.5–5.2)
SODIUM: 142 mmol/L (ref 134–144)

## 2016-11-01 LAB — LIPID PANEL
CHOL/HDL RATIO: 3.2 ratio (ref 0.0–4.4)
Cholesterol, Total: 179 mg/dL (ref 100–199)
HDL: 56 mg/dL (ref >39)
LDL Calculated: 103 mg/dL — ABNORMAL HIGH (ref 0–99)
TRIGLYCERIDES: 101 mg/dL (ref 0–149)
VLDL Cholesterol Cal: 20 mg/dL (ref 5–40)

## 2016-11-01 LAB — HEPATIC FUNCTION PANEL (6)
ALK PHOS: 53 IU/L (ref 39–117)
ALT: 14 IU/L (ref 0–32)
AST: 20 IU/L (ref 0–40)
Bilirubin Total: 0.5 mg/dL (ref 0.0–1.2)
Bilirubin, Direct: 0.13 mg/dL (ref 0.00–0.40)

## 2016-11-01 LAB — CBC WITH DIFFERENTIAL/PLATELET
BASOS ABS: 0.1 10*3/uL (ref 0.0–0.2)
Basos: 1 %
EOS (ABSOLUTE): 0.2 10*3/uL (ref 0.0–0.4)
Eos: 3 %
HEMOGLOBIN: 13.3 g/dL (ref 11.1–15.9)
Hematocrit: 41.1 % (ref 34.0–46.6)
IMMATURE GRANS (ABS): 0 10*3/uL (ref 0.0–0.1)
IMMATURE GRANULOCYTES: 0 %
LYMPHS: 32 %
Lymphocytes Absolute: 1.7 10*3/uL (ref 0.7–3.1)
MCH: 30.9 pg (ref 26.6–33.0)
MCHC: 32.4 g/dL (ref 31.5–35.7)
MCV: 95 fL (ref 79–97)
MONOCYTES: 16 %
Monocytes Absolute: 0.8 10*3/uL (ref 0.1–0.9)
NEUTROS PCT: 48 %
Neutrophils Absolute: 2.6 10*3/uL (ref 1.4–7.0)
PLATELETS: 248 10*3/uL (ref 150–379)
RBC: 4.31 x10E6/uL (ref 3.77–5.28)
RDW: 14 % (ref 12.3–15.4)
WBC: 5.4 10*3/uL (ref 3.4–10.8)

## 2016-11-01 LAB — HEPATITIS C ANTIBODY

## 2016-11-07 ENCOUNTER — Telehealth: Payer: Self-pay | Admitting: Family Medicine

## 2016-11-07 NOTE — Telephone Encounter (Signed)
Pt came in the office, stated that she was advised she has an x-ray appt on 11/14/2016 at 8:30am. Pt stated she checked down stairs and was advised there was nothing scheduled. Pt request a call back on cell # [phone]. Please advise. Thanks DS

## 2016-11-17 DIAGNOSIS — M25551 Pain in right hip: Secondary | ICD-10-CM | POA: Diagnosis not present

## 2016-11-17 DIAGNOSIS — M5431 Sciatica, right side: Secondary | ICD-10-CM | POA: Diagnosis not present

## 2016-11-17 DIAGNOSIS — M1611 Unilateral primary osteoarthritis, right hip: Secondary | ICD-10-CM | POA: Diagnosis not present

## 2016-12-07 ENCOUNTER — Ambulatory Visit
Admission: RE | Admit: 2016-12-07 | Discharge: 2016-12-07 | Disposition: A | Discharge status: Home / Self Care | Payer: Medicare Other | Source: Ambulatory Visit | Attending: Family Medicine | Admitting: Family Medicine

## 2016-12-07 DIAGNOSIS — Z1239 Encounter for other screening for malignant neoplasm of breast: Secondary | ICD-10-CM

## 2016-12-07 DIAGNOSIS — Z1231 Encounter for screening mammogram for malignant neoplasm of breast: Secondary | ICD-10-CM | POA: Diagnosis not present

## 2016-12-08 ENCOUNTER — Other Ambulatory Visit: Payer: Self-pay | Admitting: Family Medicine

## 2016-12-08 ENCOUNTER — Other Ambulatory Visit: Payer: Self-pay

## 2016-12-08 DIAGNOSIS — R928 Other abnormal and inconclusive findings on diagnostic imaging of breast: Secondary | ICD-10-CM

## 2016-12-19 ENCOUNTER — Ambulatory Visit
Admission: RE | Admit: 2016-12-19 | Discharge: 2016-12-19 | Disposition: A | Discharge status: Home / Self Care | Payer: Medicare Other | Source: Ambulatory Visit | Attending: Family Medicine | Admitting: Family Medicine

## 2016-12-19 ENCOUNTER — Other Ambulatory Visit: Payer: Self-pay | Admitting: Family Medicine

## 2016-12-19 DIAGNOSIS — R928 Other abnormal and inconclusive findings on diagnostic imaging of breast: Secondary | ICD-10-CM | POA: Insufficient documentation

## 2016-12-19 DIAGNOSIS — N6489 Other specified disorders of breast: Secondary | ICD-10-CM | POA: Diagnosis not present

## 2016-12-28 ENCOUNTER — Ambulatory Visit
Admission: RE | Admit: 2016-12-28 | Discharge: 2016-12-28 | Disposition: A | Discharge status: Home / Self Care | Payer: Medicare Other | Source: Ambulatory Visit | Attending: Family Medicine | Admitting: Family Medicine

## 2016-12-28 DIAGNOSIS — C50412 Malignant neoplasm of upper-outer quadrant of left female breast: Secondary | ICD-10-CM | POA: Insufficient documentation

## 2016-12-28 DIAGNOSIS — R928 Other abnormal and inconclusive findings on diagnostic imaging of breast: Secondary | ICD-10-CM | POA: Diagnosis present

## 2016-12-28 DIAGNOSIS — N6489 Other specified disorders of breast: Secondary | ICD-10-CM | POA: Diagnosis not present

## 2016-12-28 HISTORY — PX: BREAST BIOPSY: SHX20

## 2016-12-30 ENCOUNTER — Other Ambulatory Visit: Payer: Self-pay

## 2016-12-30 ENCOUNTER — Encounter: Payer: Self-pay | Admitting: *Deleted

## 2016-12-30 ENCOUNTER — Telehealth: Payer: Self-pay | Admitting: *Deleted

## 2016-12-30 DIAGNOSIS — C50919 Malignant neoplasm of unspecified site of unspecified female breast: Secondary | ICD-10-CM

## 2016-12-30 DIAGNOSIS — C50412 Malignant neoplasm of upper-outer quadrant of left female breast: Secondary | ICD-10-CM | POA: Diagnosis not present

## 2016-12-30 NOTE — Telephone Encounter (Signed)
  Oncology Nurse Navigator Documentation  Navigator Location: [facility] (12/30/16 1300)   )Navigator Encounter Type: Telephone (12/30/16 1300) Telephone: Incoming Call (12/30/16 1300)                                                  Time Spent with Patient: 30 (12/30/16 1300)   Patient's daughter Willette Cluster called with questions regarding her mom's new diagnosis.  There is a consent form from the [facility] giving permission to discuss medical care with [facility].  I did however, call the patient to get verbal permission to discuss and answer her daughters questions.  The patient gave permission.  I spent approximately 20-25 minutes reviewing her pathology and answering questions.  She was encouraged to call if she had any other questions.

## 2016-12-30 NOTE — Progress Notes (Signed)
  Oncology Nurse Navigator Documentation  Navigator Location: [facility] (12/30/16 1000)   )Navigator Encounter Type: Introductory phone call (12/30/16 1000)   Abnormal Finding Date: 12/19/16 (12/30/16 1000) Confirmed Diagnosis Date: 12/29/16 (12/30/16 1000)               Patient Visit Type: Initial (12/30/16 1000)   Barriers/Navigation Needs: Education;Coordination of Care (12/30/16 1000) Education: Coping with Diagnosis/ Prognosis;Newly Diagnosed Cancer Education (12/30/16 1000) Interventions: Coordination of Care (12/30/16 1000)                      Time Spent with Patient: 60 (12/30/16 1000)   Called patient to establish navigation services.  She is newly diagnosed with invasive breast cancer.  States she would like to go to [facility] to see Dr. Stasia Cavalier and Dr. Derrill Center.  Since I don't schedule appointments to [facility], I called her primary care provider, Dr. Ronnald Ramp and spoke to Baxter Flattery, his nurse.  Informed her of the patients desire to go to [facility] to see the above physicans.  Their office will make the referral.  Informed patient that Baxter Flattery said to call her back if she had not heard from [place] by Tuesday.  She is agreeable.  Since I have known this patient personally for a long time I will drop off educational literature to her.  She is to call if she has any questions or needs.

## 2017-01-03 LAB — SURGICAL PATHOLOGY

## 2017-01-05 DIAGNOSIS — C50912 Malignant neoplasm of unspecified site of left female breast: Secondary | ICD-10-CM | POA: Diagnosis not present

## 2017-01-06 ENCOUNTER — Other Ambulatory Visit: Payer: Self-pay

## 2017-01-06 ENCOUNTER — Other Ambulatory Visit: Payer: Self-pay | Admitting: Family Medicine

## 2017-01-06 DIAGNOSIS — F410 Panic disorder [episodic paroxysmal anxiety] without agoraphobia: Secondary | ICD-10-CM

## 2017-01-09 DIAGNOSIS — C50912 Malignant neoplasm of unspecified site of left female breast: Secondary | ICD-10-CM | POA: Diagnosis not present

## 2017-01-10 DIAGNOSIS — Z170 Estrogen receptor positive status [ER+]: Secondary | ICD-10-CM | POA: Insufficient documentation

## 2017-01-10 DIAGNOSIS — C50412 Malignant neoplasm of upper-outer quadrant of left female breast: Secondary | ICD-10-CM

## 2017-01-10 HISTORY — DX: Estrogen receptor positive status (ER+): C50.412

## 2017-01-11 DIAGNOSIS — Z6823 Body mass index (BMI) 23.0-23.9, adult: Secondary | ICD-10-CM | POA: Diagnosis not present

## 2017-01-11 DIAGNOSIS — Z170 Estrogen receptor positive status [ER+]: Secondary | ICD-10-CM | POA: Diagnosis not present

## 2017-01-11 DIAGNOSIS — C50912 Malignant neoplasm of unspecified site of left female breast: Secondary | ICD-10-CM | POA: Diagnosis not present

## 2017-01-11 DIAGNOSIS — C50412 Malignant neoplasm of upper-outer quadrant of left female breast: Secondary | ICD-10-CM | POA: Diagnosis not present

## 2017-01-25 DIAGNOSIS — C50412 Malignant neoplasm of upper-outer quadrant of left female breast: Secondary | ICD-10-CM | POA: Diagnosis not present

## 2017-01-25 DIAGNOSIS — Z170 Estrogen receptor positive status [ER+]: Secondary | ICD-10-CM | POA: Diagnosis not present

## 2017-01-26 DIAGNOSIS — Z170 Estrogen receptor positive status [ER+]: Secondary | ICD-10-CM | POA: Diagnosis not present

## 2017-01-26 DIAGNOSIS — Z9221 Personal history of antineoplastic chemotherapy: Secondary | ICD-10-CM | POA: Diagnosis not present

## 2017-01-26 DIAGNOSIS — C50412 Malignant neoplasm of upper-outer quadrant of left female breast: Secondary | ICD-10-CM | POA: Diagnosis not present

## 2017-01-26 DIAGNOSIS — N6012 Diffuse cystic mastopathy of left breast: Secondary | ICD-10-CM | POA: Diagnosis not present

## 2017-01-26 DIAGNOSIS — C50912 Malignant neoplasm of unspecified site of left female breast: Secondary | ICD-10-CM | POA: Diagnosis not present

## 2017-01-26 DIAGNOSIS — Z800 Family history of malignant neoplasm of digestive organs: Secondary | ICD-10-CM | POA: Diagnosis not present

## 2017-02-06 ENCOUNTER — Other Ambulatory Visit: Payer: Self-pay

## 2017-02-06 ENCOUNTER — Other Ambulatory Visit: Payer: Self-pay | Admitting: Oncology

## 2017-02-06 DIAGNOSIS — N951 Menopausal and female climacteric states: Secondary | ICD-10-CM

## 2017-02-06 DIAGNOSIS — Z170 Estrogen receptor positive status [ER+]: Secondary | ICD-10-CM | POA: Diagnosis not present

## 2017-02-06 DIAGNOSIS — C50412 Malignant neoplasm of upper-outer quadrant of left female breast: Secondary | ICD-10-CM | POA: Diagnosis not present

## 2017-03-08 ENCOUNTER — Ambulatory Visit
Admission: RE | Admit: 2017-03-08 | Discharge: 2017-03-08 | Disposition: A | Discharge status: Home / Self Care | Payer: Medicare Other | Source: Ambulatory Visit | Attending: Oncology | Admitting: Oncology

## 2017-03-08 DIAGNOSIS — M85852 Other specified disorders of bone density and structure, left thigh: Secondary | ICD-10-CM | POA: Insufficient documentation

## 2017-03-08 DIAGNOSIS — N951 Menopausal and female climacteric states: Secondary | ICD-10-CM | POA: Insufficient documentation

## 2017-03-08 DIAGNOSIS — Z780 Asymptomatic menopausal state: Secondary | ICD-10-CM | POA: Diagnosis not present

## 2017-03-22 ENCOUNTER — Other Ambulatory Visit: Payer: Self-pay

## 2017-03-30 ENCOUNTER — Other Ambulatory Visit: Payer: Self-pay | Admitting: Family Medicine

## 2017-03-30 DIAGNOSIS — G629 Polyneuropathy, unspecified: Secondary | ICD-10-CM

## 2017-04-11 ENCOUNTER — Other Ambulatory Visit: Payer: Self-pay | Admitting: Family Medicine

## 2017-04-11 DIAGNOSIS — G629 Polyneuropathy, unspecified: Secondary | ICD-10-CM

## 2017-07-14 ENCOUNTER — Other Ambulatory Visit: Payer: Self-pay | Admitting: Family Medicine

## 2017-07-14 DIAGNOSIS — E7849 Other hyperlipidemia: Secondary | ICD-10-CM

## 2017-07-14 DIAGNOSIS — N393 Stress incontinence (female) (male): Secondary | ICD-10-CM

## 2017-07-28 DIAGNOSIS — H2513 Age-related nuclear cataract, bilateral: Secondary | ICD-10-CM | POA: Diagnosis not present

## 2017-08-14 DIAGNOSIS — M79606 Pain in leg, unspecified: Secondary | ICD-10-CM | POA: Diagnosis not present

## 2017-08-14 DIAGNOSIS — Z853 Personal history of malignant neoplasm of breast: Secondary | ICD-10-CM | POA: Diagnosis not present

## 2017-08-14 DIAGNOSIS — R928 Other abnormal and inconclusive findings on diagnostic imaging of breast: Secondary | ICD-10-CM | POA: Diagnosis not present

## 2017-08-14 DIAGNOSIS — Z170 Estrogen receptor positive status [ER+]: Secondary | ICD-10-CM | POA: Diagnosis not present

## 2017-08-14 DIAGNOSIS — Z9221 Personal history of antineoplastic chemotherapy: Secondary | ICD-10-CM | POA: Diagnosis not present

## 2017-08-14 DIAGNOSIS — C50412 Malignant neoplasm of upper-outer quadrant of left female breast: Secondary | ICD-10-CM | POA: Diagnosis not present

## 2017-08-14 DIAGNOSIS — Z882 Allergy status to sulfonamides status: Secondary | ICD-10-CM | POA: Diagnosis not present

## 2017-08-14 DIAGNOSIS — Z6824 Body mass index (BMI) 24.0-24.9, adult: Secondary | ICD-10-CM | POA: Diagnosis not present

## 2017-08-14 DIAGNOSIS — Z9010 Acquired absence of unspecified breast and nipple: Secondary | ICD-10-CM | POA: Diagnosis not present

## 2017-08-14 DIAGNOSIS — Z800 Family history of malignant neoplasm of digestive organs: Secondary | ICD-10-CM | POA: Diagnosis not present

## 2017-08-14 DIAGNOSIS — Z888 Allergy status to other drugs, medicaments and biological substances status: Secondary | ICD-10-CM | POA: Diagnosis not present

## 2017-08-21 ENCOUNTER — Ambulatory Visit (INDEPENDENT_AMBULATORY_CARE_PROVIDER_SITE_OTHER): Payer: Medicare Other

## 2017-08-21 VITALS — BP 102/60 | HR 76 | Temp 98.0°F | Resp 12 | Ht 66.0 in | Wt 150.8 lb

## 2017-08-21 DIAGNOSIS — Z1211 Encounter for screening for malignant neoplasm of colon: Secondary | ICD-10-CM | POA: Diagnosis not present

## 2017-08-21 DIAGNOSIS — Z0000 Encounter for general adult medical examination without abnormal findings: Secondary | ICD-10-CM

## 2017-08-21 NOTE — Patient Instructions (Signed)
Ms. Melissa Daniels , Thank you for taking time to come for your Medicare Wellness Visit. I appreciate your ongoing commitment to your health goals. Please review the following plan we discussed and let me know if I can assist you in the future.   Screening recommendations/referrals: Colorectal Screening: Completed colonoscopy 04/22/14. Repeat every 10 years. You will receive a call from our office regarding your appointment with GI Mammogram: Completed 12/07/16. Repeat every year. Bone Density: Completed 03/08/17. Osteoporotic screening no longer required.  Vision and Dental Exams: Recommended annual ophthalmology exams for early detection of glaucoma and other disorders of the eye Recommended annual dental exams for proper oral hygiene  Vaccinations: Influenza vaccine: Up to date Pneumococcal vaccine: Completed series Tdap vaccine: Up to date Shingles vaccine: Please call your insurance company to determine your out of pocket expense for the Shingrix vaccine. You may also receive this vaccine at your local pharmacy or Health Dept.  Advanced directives: Please bring a copy of your POA (Power of Attorney) and/or Living Will to your next appointment.  Conditions/risks identified: Recommend to drink at least 6-8 8oz glasses of water per day.  Next appointment: Please schedule your Annual Wellness Visit with your Nurse Health Advisor in one year.  Preventive Care 60 Years and Older, Female Preventive care refers to lifestyle choices and visits with your health care provider that can promote health and wellness. What does preventive care include?  A yearly physical exam. This is also called an annual well check.  Dental exams once or twice a year.  Routine eye exams. Ask your health care provider how often you should have your eyes checked.  Personal lifestyle choices, including:  Daily care of your teeth and gums.  Regular physical activity.  Eating a healthy diet.  Avoiding tobacco and  drug use.  Limiting alcohol use.  Practicing safe sex.  Taking low-dose aspirin every day.  Taking vitamin and mineral supplements as recommended by your health care provider. What happens during an annual well check? The services and screenings done by your health care provider during your annual well check will depend on your age, overall health, lifestyle risk factors, and family history of disease. Counseling  Your health care provider may ask you questions about your:  Alcohol use.  Tobacco use.  Drug use.  Emotional well-being.  Home and relationship well-being.  Sexual activity.  Eating habits.  History of falls.  Memory and ability to understand (cognition).  Work and work Statistician.  Reproductive health. Screening  You may have the following tests or measurements:  Height, weight, and BMI.  Blood pressure.  Lipid and cholesterol levels. These may be checked every 5 years, or more frequently if you are over 57 years old.  Skin check.  Lung cancer screening. You may have this screening every year starting at age 66 if you have a 30-pack-year history of smoking and currently smoke or have quit within the past 15 years.  Fecal occult blood test (FOBT) of the stool. You may have this test every year starting at age 75.  Flexible sigmoidoscopy or colonoscopy. You may have a sigmoidoscopy every 5 years or a colonoscopy every 10 years starting at age 84.  Hepatitis C blood test.  Hepatitis B blood test.  Sexually transmitted disease (STD) testing.  Diabetes screening. This is done by checking your blood sugar (glucose) after you have not eaten for a while (fasting). You may have this done every 1-3 years.  Bone density scan. This is done  to screen for osteoporosis. You may have this done starting at age 52.  Mammogram. This may be done every 1-2 years. Talk to your health care provider about how often you should have regular mammograms. Talk with your  health care provider about your test results, treatment options, and if necessary, the need for more tests. Vaccines  Your health care provider may recommend certain vaccines, such as:  Influenza vaccine. This is recommended every year.  Tetanus, diphtheria, and acellular pertussis (Tdap, Td) vaccine. You may need a Td booster every 10 years.  Zoster vaccine. You may need this after age 92.  Pneumococcal 13-valent conjugate (PCV13) vaccine. One dose is recommended after age 58.  Pneumococcal polysaccharide (PPSV23) vaccine. One dose is recommended after age 54. Talk to your health care provider about which screenings and vaccines you need and how often you need them. This information is not intended to replace advice given to you by your health care provider. Make sure you discuss any questions you have with your health care provider. Document Released: 06/12/2015 Document Revised: 02/03/2016 Document Reviewed: 03/17/2015 Elsevier Interactive Patient Education  2017 [employer] Prevention in the Home Falls can cause injuries. They can happen to people of all ages. There are many things you can do to make your home safe and to help prevent falls. What can I do on the outside of my home?  Regularly fix the edges of walkways and driveways and fix any cracks.  Remove anything that might make you trip as you walk through a door, such as a raised step or threshold.  Trim any bushes or trees on the path to your home.  Use bright outdoor lighting.  Clear any walking paths of anything that might make someone trip, such as rocks or tools.  Regularly check to see if handrails are loose or broken. Make sure that both sides of any steps have handrails.  Any raised decks and porches should have guardrails on the edges.  Have any leaves, snow, or ice cleared regularly.  Use sand or salt on walking paths during winter.  Clean up any spills in your garage right away. This includes oil  or grease spills. What can I do in the bathroom?  Use night lights.  Install grab bars by the toilet and in the tub and shower. Do not use towel bars as grab bars.  Use non-skid mats or decals in the tub or shower.  If you need to sit down in the shower, use a plastic, non-slip stool.  Keep the floor dry. Clean up any water that spills on the floor as soon as it happens.  Remove soap buildup in the tub or shower regularly.  Attach bath mats securely with double-sided non-slip rug tape.  Do not have throw rugs and other things on the floor that can make you trip. What can I do in the bedroom?  Use night lights.  Make sure that you have a light by your bed that is easy to reach.  Do not use any sheets or blankets that are too big for your bed. They should not hang down onto the floor.  Have a firm chair that has side arms. You can use this for support while you get dressed.  Do not have throw rugs and other things on the floor that can make you trip. What can I do in the kitchen?  Clean up any spills right away.  Avoid walking on wet floors.  Keep items  that you use a lot in easy-to-reach places.  If you need to reach something above you, use a strong step stool that has a grab bar.  Keep electrical cords out of the way.  Do not use floor polish or wax that makes floors slippery. If you must use wax, use non-skid floor wax.  Do not have throw rugs and other things on the floor that can make you trip. What can I do with my stairs?  Do not leave any items on the stairs.  Make sure that there are handrails on both sides of the stairs and use them. Fix handrails that are broken or loose. Make sure that handrails are as long as the stairways.  Check any carpeting to make sure that it is firmly attached to the stairs. Fix any carpet that is loose or worn.  Avoid having throw rugs at the top or bottom of the stairs. If you do have throw rugs, attach them to the floor with  carpet tape.  Make sure that you have a light switch at the top of the stairs and the bottom of the stairs. If you do not have them, ask someone to add them for you. What else can I do to help prevent falls?  Wear shoes that:  Do not have high heels.  Have rubber bottoms.  Are comfortable and fit you well.  Are closed at the toe. Do not wear sandals.  If you use a stepladder:  Make sure that it is fully opened. Do not climb a closed stepladder.  Make sure that both sides of the stepladder are locked into place.  Ask someone to hold it for you, if possible.  Clearly mark and make sure that you can see:  Any grab bars or handrails.  First and last steps.  Where the edge of each step is.  Use tools that help you move around (mobility aids) if they are needed. These include:  Canes.  Walkers.  Scooters.  Crutches.  Turn on the lights when you go into a dark area. Replace any light bulbs as soon as they burn out.  Set up your furniture so you have a clear path. Avoid moving your furniture around.  If any of your floors are uneven, fix them.  If there are any pets around you, be aware of where they are.  Review your medicines with your doctor. Some medicines can make you feel dizzy. This can increase your chance of falling. Ask your doctor what other things that you can do to help prevent falls. This information is not intended to replace advice given to you by your health care provider. Make sure you discuss any questions you have with your health care provider. Document Released: 03/12/2009 Document Revised: 10/22/2015 Document Reviewed: 06/20/2014 Elsevier Interactive Patient Education  2017 [employer].

## 2017-08-21 NOTE — Progress Notes (Addendum)
Subjective:   Melissa Daniels is a 72 y.o. female who presents for Medicare Annual (Subsequent) preventive examination.  Review of Systems:  N/A Cardiac Risk Factors include: advanced age (>52men, >3 women);dyslipidemia     Objective:     Vitals: BP 102/60 (BP Location: Right Arm, Patient Position: Sitting, Cuff Size: Normal)   Pulse 76   Temp 98 F (36.7 C) (Oral)   Resp 12   Ht 5\' 6"  (1.676 m)   Wt 150 lb 12.8 oz (68.4 kg)   SpO2 95%   BMI 24.34 kg/m   Body mass index is 24.34 kg/m.  Advanced Directives 08/21/2017  Does Patient Have a Medical Advance Directive? Yes  Type of Paramedic of [place];Living will  Copy of [place] in Chart? No - copy requested    Tobacco Social History   Tobacco Use  Smoking Status Never Smoker  Smokeless Tobacco Never Used  Tobacco Comment   smoking cessation materials not required     Counseling given: No Comment: smoking cessation materials not required   Clinical Intake:  Pre-visit preparation completed: Yes  Pain : No/denies pain   BMI - recorded: 24.34 Nutritional Status: BMI of 19-24  Normal Nutritional Risks: None Diabetes: No  How often do you need to have someone help you when you read instructions, pamphlets, or other written materials from your doctor or pharmacy?: 1 - Never  Interpreter Needed?: No  Information entered by :: AEversole, LPN  Past Medical History:  Diagnosis Date  . Anxiety   . Hypercholesteremia   . ITP (idiopathic thrombocytopenic purpura)   . Neuropathy    bilateral feet  . Osteoporosis    Past Surgical History:  Procedure Laterality Date  . BREAST BIOPSY Left 12/28/2016   stereo path pend  . BREAST LUMPECTOMY    . SPLENECTOMY, TOTAL     Family History  Problem Relation Age of Onset  . Breast cancer Neg Hx    Social History   Socioeconomic History  . Marital status: Widowed    Spouse name: Not on file  . Number of children: 3  .  Years of education: Not on file  . Highest education level: 12th grade  Occupational History  . Occupation: Retired  Scientific laboratory technician  . Financial resource strain: Not hard at all  . Food insecurity:    Worry: Never true    Inability: Never true  . Transportation needs:    Medical: No    Non-medical: No  Tobacco Use  . Smoking status: Never Smoker  . Smokeless tobacco: Never Used  . Tobacco comment: smoking cessation materials not required  Substance and Sexual Activity  . Alcohol use: No    Alcohol/week: 0.0 oz  . Drug use: No  . Sexual activity: Not Currently  Lifestyle  . Physical activity:    Days per week: 2 days    Minutes per session: 60 min  . Stress: Not at all  Relationships  . Social connections:    Talks on phone: Patient refused    Gets together: Patient refused    Attends religious service: Patient refused    Active member of club or organization: Patient refused    Attends meetings of clubs or organizations: Patient refused    Relationship status: Widowed  Other Topics Concern  . Not on file  Social History Narrative  . Not on file    Outpatient Encounter Medications as of 08/21/2017  Medication Sig  . ALPRAZolam Duanne Moron)  0.25 MG tablet TAKE (1) TABLET BY MOUTH EVERY DAY AS NEEDED  . Calcium Citrate-Vitamin D (CALCIUM + D PO) Take 1 tablet by mouth 2 (two) times daily.  Marland Kitchen exemestane (AROMASIN) 25 MG tablet Take 25 mg by mouth daily.  Marland Kitchen gabapentin (NEURONTIN) 300 MG capsule TAKE (1) CAPSULE BY MOUTH TWICE DAILY  . Inulin (FIBER CHOICE) 1.5 g CHEW Chew 1 tablet by mouth daily.  . Magnesium 100 MG CAPS Take 2 capsules by mouth daily.   Marland Kitchen oxybutynin (DITROPAN-XL) 10 MG 24 hr tablet TAKE ONE TABLET AT BEDTIME.  . simvastatin (ZOCOR) 20 MG tablet TAKE ONE TABLET AT BEDTIME.  . [DISCONTINUED] estradiol-norethindrone (ACTIVELLA) 1-0.5 MG tablet TAKE (1) TABLET BY MOUTH EVERY DAY   No facility-administered encounter medications on file as of 08/21/2017.      Activities of Daily Living In your present state of health, do you have any difficulty performing the following activities: 08/21/2017 10/31/2016  Hearing? N N  Comment denies hearing aids -  Vision? N N  Comment wears eyeglasses -  Difficulty concentrating or making decisions? N N  Walking or climbing stairs? N N  Dressing or bathing? N N  Doing errands, shopping? N N  Preparing Food and eating ? N N  Comment denies dentures -  Using the Toilet? N N  In the past six months, have you accidently leaked urine? Y N  Comment takes Ditropan -  Do you have problems with loss of bowel control? N N  Managing your Medications? N N  Managing your Finances? N N  Housekeeping or managing your Housekeeping? N N  Some recent data might be hidden    Patient Care Team: Juline Patch, MD as PCP - General (Family Medicine) Cornelia Copa, DO as Consulting Physician (Surgical Oncology) Reeder-Hayes, Aundra Millet, MD as Consulting Physician (Oncology) [place], [place] as Consulting Physician [facility])    Assessment:   This is a routine wellness examination for Melissa Daniels.  Exercise Activities and Dietary recommendations Current Exercise Habits: Structured exercise class, Type of exercise: strength training/weights, Time (Minutes): 60, Frequency (Times/Week): 2, Weekly Exercise (Minutes/Week): 120, Intensity: Mild, Exercise limited by: None identified  Goals    . DIET - INCREASE WATER INTAKE     Recommend to drink at least 6-8 8oz glasses of water per day.       Fall Risk Fall Risk  08/21/2017 10/31/2016 10/19/2015 07/09/2015  Falls in the past year? No No No No  Risk for fall due to : Impaired vision - - -  Risk for fall due to: Comment wears eyeglasses - - -   Is the home free of loose throw rugs in walkways, pet beds, electrical cords, etc? Yes Adequate lighting to reduce risk of falls?  Yes In addition, does the patient have any of the following: Stairs in or around the  home WITH handrails? Yes Grab bars in the bathroom? No  Shower chair or a place to sit while bathing? No Use of a cane, walker or w/c? No Use of an elevated toilet seat or a handicapped toilet? No  Timed Get Up and Go Performed: Yes. Pt ambulated 10 feet within 6 sec. Gait stead-fast and without the use of an assistive device. No intervention required at this time. Fall risk prevention has been discussed.  Pt declined my offer to send Community Resource Referral to Care Guide for installation of grab bars in the shower, shower chair or an elevated toilet seat.  Depression Screen  PHQ 2/9 Scores 08/21/2017 10/31/2016 10/19/2015 07/09/2015  PHQ - 2 Score 0 0 0 0  PHQ- 9 Score 0 - - -     Cognitive Function     6CIT Screen 08/21/2017 10/31/2016  What Year? 0 points 0 points  What month? 0 points 0 points  What time? 0 points 0 points  Count back from 20 0 points 0 points  Months in reverse 0 points 0 points  Repeat phrase 4 points 0 points  Total Score 4 0    Immunization History  Administered Date(s) Administered  . Influenza-Unspecified 01/29/2015, 02/27/2017  . Meningococcal Conjugate 08/17/2014  . Pneumococcal Conjugate-13 05/30/2013  . Pneumococcal Polysaccharide-23 04/02/2012  . Tdap 10/19/2015    Qualifies for Shingles Vaccine? Yes. Due for Zostavax or Shingrix vaccine. Education has been provided regarding the importance of this vaccine. Pt has been advised to call her insurance company to determine her out of pocket expense. Advised she may also receive this vaccine at her local pharmacy or Health Dept. Verbalized acceptance and understanding.  Screening Tests Health Maintenance  Topic Date Due  . MAMMOGRAM  12/07/2017  . COLONOSCOPY  04/23/2019  . TETANUS/TDAP  10/18/2025  . INFLUENZA VACCINE  Completed  . DEXA SCAN  Completed  . Hepatitis C Screening  Completed  . PNA vac Low Risk Adult  Completed    Cancer Screenings: Lung: Low Dose CT Chest recommended if Age  23-80 years, 30 pack-year currently smoking OR have quit w/in 15years. Patient does not qualify. Breast:  Up to date on Mammogram? Yes. Completed 12/07/16. Repeat every year. Up to date of Bone Density/Dexa? Yes. Completed 03/08/17. Osteoporotic screening no longer required. Colorectal: Completed colonoscopy 04/22/14. Repeat every 5 years. Given recent h/o malignant neoplasm of upper-outer quadrant of left breast, pt is requesting to be referred to GI for screening colonoscopy. Referral to GI placed today. Pt aware she will receive a call from our office re: her appt.  Additional Screenings: Hepatitis B/HIV/Syphillis: Does not qualify Hepatitis C Screening: Completed 10/31/16     Plan:  I have personally reviewed and addressed the Medicare Annual Wellness questionnaire and have noted the following in the patient's chart:  A. Medical and social history B. Use of alcohol, tobacco or illicit drugs  C. Current medications and supplements D. Functional ability and status E.  Nutritional status F.  Physical activity G. Advance directives H. List of other physicians I.  Hospitalizations, surgeries, and ER visits in previous 12 months J.  [place] such as hearing and vision if needed, cognitive and depression L. Referrals and appointments - none  In addition, I have reviewed and discussed with patient certain preventive protocols, quality metrics, and best practice recommendations. A written personalized care plan for preventive services as well as general preventive health recommendations were provided to patient.  Signed,  Aleatha Borer, LPN Nurse Health Advisor  MD Recommendations: Due for Zostavax or Shingrix vaccine. Education has been provided regarding the importance of this vaccine. Pt has been advised to call her insurance company to determine her out of pocket expense. Advised she may also receive this vaccine at her local pharmacy or Health Dept. Verbalized acceptance and  understanding.  Colorectal screening: Completed colonoscopy 04/22/14. Repeat every 5 years. Given recent h/o malignant neoplasm of upper-outer quadrant of left breast, pt is requesting to be referred to GI for screening colonoscopy. Referral to GI placed today. Pt aware she will receive a call from our office re: her appt.

## 2017-08-28 ENCOUNTER — Other Ambulatory Visit: Payer: Self-pay

## 2017-08-28 DIAGNOSIS — N393 Stress incontinence (female) (male): Secondary | ICD-10-CM

## 2017-08-28 MED ORDER — OXYBUTYNIN CHLORIDE ER 10 MG PO TB24: 10.0000 mg | ORAL_TABLET | Freq: Every day | ORAL | 0 refills | Status: DC

## 2017-08-29 ENCOUNTER — Telehealth: Payer: Self-pay | Admitting: Gastroenterology

## 2017-08-29 ENCOUNTER — Other Ambulatory Visit: Payer: Self-pay

## 2017-08-29 DIAGNOSIS — Z860100 Personal history of colon polyps, unspecified: Secondary | ICD-10-CM

## 2017-08-29 NOTE — Telephone Encounter (Signed)
Gastroenterology Pre-Procedure Review  Request Date: 09/08/17 Requesting Physician: Dr. Allen Norris  PATIENT REVIEW QUESTIONS: The patient responded to the following health history questions as indicated:    1. Are you having any GI issues? no 2. Do you have a personal history of Polyps? yes (3 years ago) 3. Do you have a family history of Colon Cancer or Polyps? yes (Mother Colon Cancer) 4. Diabetes Mellitus? no 5. Joint replacements in the past 12 months?no 6. Major health problems in the past 3 months?ITP Patient, Breast Cancer 2018, Lobectomy 12/2016 7. Any artificial heart valves, MVP, or defibrillator?no    MEDICATIONS & ALLERGIES:    Patient reports the following regarding taking any anticoagulation/antiplatelet therapy:   Plavix, Coumadin, Eliquis, Xarelto, Lovenox, Pradaxa, Brilinta, or Effient? no Aspirin? no  Patient confirms/reports the following medications:  Current Outpatient Medications  Medication Sig Dispense Refill  . ALPRAZolam (XANAX) 0.25 MG tablet TAKE (1) TABLET BY MOUTH EVERY DAY AS NEEDED 30 tablet 0  . Calcium Citrate-Vitamin D (CALCIUM + D PO) Take 1 tablet by mouth 2 (two) times daily.    Marland Kitchen exemestane (AROMASIN) 25 MG tablet Take 25 mg by mouth daily.    Marland Kitchen gabapentin (NEURONTIN) 300 MG capsule TAKE (1) CAPSULE BY MOUTH TWICE DAILY 60 capsule 1  . Inulin (FIBER CHOICE) 1.5 g CHEW Chew 1 tablet by mouth daily.    . Magnesium 100 MG CAPS Take 2 capsules by mouth daily.     Marland Kitchen oxybutynin (DITROPAN-XL) 10 MG 24 hr tablet Take 1 tablet (10 mg total) by mouth at bedtime. 90 tablet 0  . simvastatin (ZOCOR) 20 MG tablet TAKE ONE TABLET AT BEDTIME. 30 tablet 0   No current facility-administered medications for this visit.     Patient confirms/reports the following allergies:  Allergies  Allergen Reactions  . Macrobid [Nitrofurantoin]   . Sulfa Antibiotics     No orders of the defined types were placed in this encounter.   AUTHORIZATION INFORMATION Primary  Insurance: 1D#: Group #:  Secondary Insurance: 1D#: Group #:  SCHEDULE INFORMATION: Date: 09/08/17 Time: Location:[facility]

## 2017-08-29 NOTE — Telephone Encounter (Signed)
Patient left a voice message that she needs to schedule a colonoscopy. Please call °

## 2017-08-30 ENCOUNTER — Other Ambulatory Visit: Payer: Self-pay

## 2017-08-30 MED ORDER — NA SULFATE-K SULFATE-MG SULF 17.5-3.13-1.6 GM/177ML PO SOLN: 1.0000 | ORAL | 0 refills | Status: DC

## 2017-08-31 ENCOUNTER — Other Ambulatory Visit: Payer: Self-pay

## 2017-08-31 ENCOUNTER — Encounter: Payer: Self-pay | Admitting: *Deleted

## 2017-09-07 NOTE — Discharge Instructions (Signed)
General Anesthesia, Adult, Care After °These instructions provide you with information about caring for yourself after your procedure. Your health care provider may also give you more specific instructions. Your treatment has been planned according to current medical practices, but problems sometimes occur. Call your health care provider if you have any problems or questions after your procedure. °What can I expect after the procedure? °After the procedure, it is common to have: °· Vomiting. °· A sore throat. °· Mental slowness. ° °It is common to feel: °· Nauseous. °· Cold or shivery. °· Sleepy. °· Tired. °· Sore or achy, even in parts of your body where you did not have surgery. ° °Follow these instructions at home: °For at least 24 hours after the procedure: °· Do not: °? Participate in activities where you could fall or become injured. °? Drive. °? Use heavy machinery. °? Drink alcohol. °? Take sleeping pills or medicines that cause drowsiness. °? Make important decisions or sign legal documents. °? Take care of children on your own. °· Rest. °Eating and drinking °· If you vomit, drink water, juice, or soup when you can drink without vomiting. °· Drink enough fluid to keep your urine clear or pale yellow. °· Make sure you have little or no nausea before eating solid foods. °· Follow the diet recommended by your health care provider. °General instructions °· Have a responsible adult stay with you until you are awake and alert. °· Return to your normal activities as told by your health care provider. Ask your health care provider what activities are safe for you. °· Take over-the-counter and prescription medicines only as told by your health care provider. °· If you smoke, do not smoke without supervision. °· Keep all follow-up visits as told by your health care provider. This is important. °Contact a health care provider if: °· You continue to have nausea or vomiting at home, and medicines are not helpful. °· You  cannot drink fluids or start eating again. °· You cannot urinate after 8-12 hours. °· You develop a skin rash. °· You have fever. °· You have increasing redness at the site of your procedure. °Get help right away if: °· You have difficulty breathing. °· You have chest pain. °· You have unexpected bleeding. °· You feel that you are having a life-threatening or urgent problem. °This information is not intended to replace advice given to you by your health care provider. Make sure you discuss any questions you have with your health care provider. °Document Released: 08/22/2000 Document Revised: 10/19/2015 Document Reviewed: 04/30/2015 °Elsevier Interactive Patient Education © 2018 [employer] ° °

## 2017-09-08 ENCOUNTER — Ambulatory Visit: Payer: Medicare Other | Admitting: Anesthesiology

## 2017-09-08 ENCOUNTER — Ambulatory Visit
Admission: RE | Admit: 2017-09-08 | Discharge: 2017-09-08 | Disposition: A | Discharge status: Home / Self Care | Payer: Medicare Other | Source: Ambulatory Visit | Attending: Gastroenterology | Admitting: Gastroenterology

## 2017-09-08 ENCOUNTER — Encounter
Admission: RE | Disposition: A | Discharge status: Home / Self Care | Payer: Self-pay | Source: Ambulatory Visit | Attending: Gastroenterology

## 2017-09-08 DIAGNOSIS — Z860100 Personal history of colon polyps, unspecified: Secondary | ICD-10-CM

## 2017-09-08 DIAGNOSIS — G629 Polyneuropathy, unspecified: Secondary | ICD-10-CM | POA: Diagnosis not present

## 2017-09-08 DIAGNOSIS — R0683 Snoring: Secondary | ICD-10-CM | POA: Diagnosis not present

## 2017-09-08 DIAGNOSIS — D123 Benign neoplasm of transverse colon: Secondary | ICD-10-CM

## 2017-09-08 DIAGNOSIS — Z881 Allergy status to other antibiotic agents status: Secondary | ICD-10-CM | POA: Diagnosis not present

## 2017-09-08 DIAGNOSIS — M810 Age-related osteoporosis without current pathological fracture: Secondary | ICD-10-CM | POA: Diagnosis not present

## 2017-09-08 DIAGNOSIS — D122 Benign neoplasm of ascending colon: Secondary | ICD-10-CM | POA: Insufficient documentation

## 2017-09-08 DIAGNOSIS — K5730 Diverticulosis of large intestine without perforation or abscess without bleeding: Secondary | ICD-10-CM | POA: Insufficient documentation

## 2017-09-08 DIAGNOSIS — Z79899 Other long term (current) drug therapy: Secondary | ICD-10-CM | POA: Insufficient documentation

## 2017-09-08 DIAGNOSIS — K641 Second degree hemorrhoids: Secondary | ICD-10-CM | POA: Diagnosis not present

## 2017-09-08 DIAGNOSIS — M19041 Primary osteoarthritis, right hand: Secondary | ICD-10-CM | POA: Insufficient documentation

## 2017-09-08 DIAGNOSIS — K635 Polyp of colon: Secondary | ICD-10-CM | POA: Diagnosis not present

## 2017-09-08 DIAGNOSIS — D693 Immune thrombocytopenic purpura: Secondary | ICD-10-CM | POA: Diagnosis not present

## 2017-09-08 DIAGNOSIS — D120 Benign neoplasm of cecum: Secondary | ICD-10-CM

## 2017-09-08 DIAGNOSIS — M19042 Primary osteoarthritis, left hand: Secondary | ICD-10-CM | POA: Diagnosis not present

## 2017-09-08 DIAGNOSIS — Z882 Allergy status to sulfonamides status: Secondary | ICD-10-CM | POA: Insufficient documentation

## 2017-09-08 DIAGNOSIS — Z9221 Personal history of antineoplastic chemotherapy: Secondary | ICD-10-CM | POA: Diagnosis not present

## 2017-09-08 DIAGNOSIS — F419 Anxiety disorder, unspecified: Secondary | ICD-10-CM | POA: Insufficient documentation

## 2017-09-08 DIAGNOSIS — Z1211 Encounter for screening for malignant neoplasm of colon: Secondary | ICD-10-CM | POA: Diagnosis not present

## 2017-09-08 DIAGNOSIS — E7800 Pure hypercholesterolemia, unspecified: Secondary | ICD-10-CM | POA: Diagnosis not present

## 2017-09-08 HISTORY — PX: POLYPECTOMY: SHX5525

## 2017-09-08 HISTORY — DX: Unspecified osteoarthritis, unspecified site: M19.90

## 2017-09-08 HISTORY — PX: COLONOSCOPY WITH PROPOFOL: SHX5780

## 2017-09-08 HISTORY — DX: Dizziness and giddiness: R42

## 2017-09-08 SURGERY — COLONOSCOPY WITH PROPOFOL
Anesthesia: General | Wound class: Contaminated

## 2017-09-08 MED ORDER — PROPOFOL 10 MG/ML IV BOLUS
INTRAVENOUS | Status: DC | PRN
  Administered 2017-09-08 (×5): 10 mg via INTRAVENOUS
  Administered 2017-09-08: 60 mg via INTRAVENOUS
  Administered 2017-09-08 (×4): 10 mg via INTRAVENOUS

## 2017-09-08 MED ORDER — LIDOCAINE HCL (CARDIAC) 20 MG/ML IV SOLN
INTRAVENOUS | Status: DC | PRN
  Administered 2017-09-08: 20 mg via INTRAVENOUS

## 2017-09-08 MED ORDER — STERILE WATER FOR IRRIGATION IR SOLN
Status: DC | PRN
  Administered 2017-09-08: 09:00:00

## 2017-09-08 MED ORDER — ACETAMINOPHEN 325 MG PO TABS: 650.0000 mg | ORAL_TABLET | Freq: Once | ORAL | Status: DC | PRN

## 2017-09-08 MED ORDER — ACETAMINOPHEN 160 MG/5ML PO SOLN: 325.0000 mg | ORAL | Status: DC | PRN

## 2017-09-08 MED ORDER — LACTATED RINGERS IV SOLN
INTRAVENOUS | Status: DC
  Administered 2017-09-08 (×2): via INTRAVENOUS

## 2017-09-08 MED ORDER — ONDANSETRON HCL 4 MG/2ML IJ SOLN: 4.0000 mg | Freq: Once | INTRAMUSCULAR | Status: DC | PRN

## 2017-09-08 SURGICAL SUPPLY — 24 items
CANISTER SUCT 1200ML W/VALVE (MISCELLANEOUS) ×4 IMPLANT
CLIP HMST 235XBRD CATH ROT (MISCELLANEOUS) IMPLANT
CLIP RESOLUTION 360 11X235 (MISCELLANEOUS)
ELECT REM PT RETURN 9FT ADLT (ELECTROSURGICAL)
ELECTRODE REM PT RTRN 9FT ADLT (ELECTROSURGICAL) IMPLANT
FCP ESCP3.2XJMB 240X2.8X (MISCELLANEOUS)
FORCEPS BIOP RAD 4 LRG CAP 4 (CUTTING FORCEPS) ×4 IMPLANT
FORCEPS BIOP RJ4 240 W/NDL (MISCELLANEOUS)
FORCEPS ESCP3.2XJMB 240X2.8X (MISCELLANEOUS) IMPLANT
GOWN CVR UNV OPN BCK APRN NK (MISCELLANEOUS) ×4 IMPLANT
GOWN ISOL THUMB LOOP REG UNIV (MISCELLANEOUS) ×4
INJECTOR VARIJECT VIN23 (MISCELLANEOUS) IMPLANT
KIT DEFENDO VALVE AND CONN (KITS) IMPLANT
KIT ENDO PROCEDURE OLY (KITS) ×4 IMPLANT
MARKER SPOT ENDO TATTOO 5ML (MISCELLANEOUS) IMPLANT
PROBE APC STR FIRE (PROBE) IMPLANT
RETRIEVER NET ROTH 2.5X230 LF (MISCELLANEOUS) IMPLANT
SNARE SHORT THROW 13M SML OVAL (MISCELLANEOUS) ×4 IMPLANT
SNARE SHORT THROW 30M LRG OVAL (MISCELLANEOUS) IMPLANT
SNARE SNG USE RND 15MM (INSTRUMENTS) IMPLANT
SPOT EX ENDOSCOPIC TATTOO (MISCELLANEOUS)
TRAP ETRAP POLY (MISCELLANEOUS) ×4 IMPLANT
VARIJECT INJECTOR VIN23 (MISCELLANEOUS)
WATER STERILE IRR 250ML POUR (IV SOLUTION) ×4 IMPLANT

## 2017-09-08 NOTE — Anesthesia Procedure Notes (Signed)
Procedure Name: MAC Performed by: Lind Guest, CRNA Pre-anesthesia Checklist: Patient identified, Emergency Drugs available, Suction available, Patient being monitored and Timeout performed Patient Re-evaluated:Patient Re-evaluated prior to induction Oxygen Delivery Method: Nasal cannula

## 2017-09-08 NOTE — Op Note (Signed)
[facility]astroenterology Patient Name: Melissa Daniels Procedure Date: 09/08/2017 8:19 AM MRN: [id number] Account #: [id number] Date of Birth: 11/02/1945 Admit Type: Outpatient Age: 72 Room: [facility] OR ROOM 01 Gender: Female Note Status: Finalized Procedure:            Colonoscopy Indications:          High risk colon cancer surveillance: Personal history                        of colonic polyps Providers:            Lucilla Lame MD, MD Referring MD:         Juline Patch, MD (Referring MD) Complications:        No immediate complications. Procedure:            Pre-Anesthesia Assessment:                       - Prior to the procedure, a History and Physical was                        performed, and patient medications and allergies were                        reviewed. The patient's tolerance of previous                        anesthesia was also reviewed. The risks and benefits of                        the procedure and the sedation options and risks were                        discussed with the patient. All questions were                        answered, and informed consent was obtained. Prior                        Anticoagulants: The patient has taken no previous                        anticoagulant or antiplatelet agents. ASA Grade                        Assessment: II - A patient with mild systemic disease.                        After reviewing the risks and benefits, the patient was                        deemed in satisfactory condition to undergo the                        procedure.                       After obtaining informed consent, the colonoscope was                        passed under direct vision. Throughout  the procedure,                        the patient's blood pressure, pulse, and oxygen                        saturations were monitored continuously. The Olympus                        Colonoscope [id number]) was introduced through the                         anus and advanced to the the cecum, identified by                        appendiceal orifice and ileocecal valve. The                        colonoscopy was performed without difficulty. The                        patient tolerated the procedure well. The quality of                        the bowel preparation was excellent. Findings:      The perianal and digital rectal examinations were normal.      A 4 mm polyp was found in the transverse colon. The polyp was sessile.       The polyp was removed with a cold biopsy forceps. Resection and       retrieval were complete.      A 8 mm polyp was found in the ascending colon. The polyp was sessile.       The polyp was removed with a cold snare. Resection and retrieval were       complete.      A 3 mm polyp was found in the cecum. The polyp was sessile. The polyp       was removed with a cold biopsy forceps. Resection and retrieval were       complete.      Non-bleeding internal hemorrhoids were found during retroflexion. The       hemorrhoids were Grade II (internal hemorrhoids that prolapse but reduce       spontaneously).      Multiple small-mouthed diverticula were found in the sigmoid colon and       descending colon. Impression:           - One 4 mm polyp in the transverse colon, removed with                        a cold biopsy forceps. Resected and retrieved.                       - One 8 mm polyp in the ascending colon, removed with a                        cold snare. Resected and retrieved.                       - One 3 mm polyp in the cecum, removed with a cold  biopsy forceps. Resected and retrieved.                       - Non-bleeding internal hemorrhoids.                       - Diverticulosis in the sigmoid colon and in the                        descending colon. Recommendation:       - Discharge patient to home.                       - Resume previous diet.                        - Continue present medications.                       - Await pathology results.                       - Repeat colonoscopy in 3 years for surveillance. Procedure Code(s):    --- Professional ---                       [phone], Colonoscopy, flexible; with removal of tumor(s),                        polyp(s), or other lesion(s) by snare technique                       [id number], 61, Colonoscopy, flexible; with biopsy, single                        or multiple Diagnosis Code(s):    --- Professional ---                       Z86.010, Personal history of colonic polyps                       D12.3, Benign neoplasm of transverse colon (hepatic                        flexure or splenic flexure)                       D12.2, Benign neoplasm of ascending colon                       D12.0, Benign neoplasm of cecum CPT copyright 2017 [employer]. All rights reserved. The codes documented in this report are preliminary and upon coder review may  be revised to meet current compliance requirements. Lucilla Lame MD, MD 09/08/2017 8:54:23 AM This report has been signed electronically. Number of Addenda: 0 Note Initiated On: 09/08/2017 8:19 AM Scope Withdrawal Time: 0 hours 7 minutes 33 seconds  Total Procedure Duration: 0 hours 15 minutes 14 seconds       [facility]

## 2017-09-08 NOTE — Transfer of Care (Signed)
Immediate Anesthesia Transfer of Care Note  Patient: Melissa Daniels  Procedure(s) Performed: COLONOSCOPY WITH PROPOFOL (N/A ) POLYPECTOMY  Patient Location: PACU  Anesthesia Type: General  Level of Consciousness: awake, alert  and patient cooperative  Airway and Oxygen Therapy: Patient Spontanous Breathing and Patient connected to supplemental oxygen  Post-op Assessment: Post-op Vital signs reviewed, Patient's Cardiovascular Status Stable, Respiratory Function Stable, Patent Airway and No signs of Nausea or vomiting  Post-op Vital Signs: Reviewed and stable  Complications: No apparent anesthesia complications

## 2017-09-08 NOTE — Anesthesia Preprocedure Evaluation (Signed)
Anesthesia Evaluation  Patient identified by MRN, date of birth, ID band Patient awake    Reviewed: Allergy & Precautions, NPO status , Patient's Chart, lab work & pertinent test results  History of Anesthesia Complications Negative for: history of anesthetic complications  Airway Mallampati: I  TM Distance: >3 FB Neck ROM: Full    Dental no notable dental hx.    Pulmonary  Snoring    Pulmonary exam normal breath sounds clear to auscultation       Cardiovascular Exercise Tolerance: Good negative cardio ROS Normal cardiovascular exam Rhythm:Regular Rate:Normal     Neuro/Psych PSYCHIATRIC DISORDERS Anxiety Neuropathy (feet); vertigo    GI/Hepatic negative GI ROS,   Endo/Other  negative endocrine ROS  Renal/GU negative Renal ROS     Musculoskeletal  (+) Arthritis , Osteoarthritis,    Abdominal   Peds  Hematology  (+) Blood dyscrasia (ITP s/p chemo (only sx is bruising, stable)), ,   Anesthesia Other Findings   Reproductive/Obstetrics                             Anesthesia Physical Anesthesia Plan  ASA: II  Anesthesia Plan: General   Post-op Pain Management:    Induction: Intravenous  PONV Risk Score and Plan: 2 and Propofol infusion and TIVA  Airway Management Planned: Natural Airway  Additional Equipment:   Intra-op Plan:   Post-operative Plan:   Informed Consent: I have reviewed the patients History and Physical, chart, labs and discussed the procedure including the risks, benefits and alternatives for the proposed anesthesia with the patient or authorized representative who has indicated his/her understanding and acceptance.     Plan Discussed with: CRNA  Anesthesia Plan Comments:         Anesthesia Quick Evaluation

## 2017-09-08 NOTE — Anesthesia Postprocedure Evaluation (Signed)
Anesthesia Post Note  Patient: Melissa Daniels  Procedure(s) Performed: COLONOSCOPY WITH PROPOFOL (N/A ) POLYPECTOMY  Patient location during evaluation: PACU Anesthesia Type: General Level of consciousness: awake and alert, oriented and patient cooperative Pain management: pain level controlled Vital Signs Assessment: post-procedure vital signs reviewed and stable Respiratory status: spontaneous breathing, nonlabored ventilation and respiratory function stable Cardiovascular status: blood pressure returned to baseline and stable Postop Assessment: adequate PO intake Anesthetic complications: no    Darrin Nipper

## 2017-09-08 NOTE — H&P (Signed)
Lucilla Lame, MD [address] [place], [place] [address] Phone:[phone] Fax : [phone]  Primary Care Physician:  Juline Patch, MD Primary Gastroenterologist:  Dr. Allen Norris  Pre-Procedure History & Physical: HPI:  Melissa Daniels is a 72 y.o. female is here for an colonoscopy.   Past Medical History:  Diagnosis Date  . Anxiety   . Arthritis    hands  . Hypercholesteremia   . ITP (idiopathic thrombocytopenic purpura)   . Neuropathy    bilateral feet  . Osteoporosis   . Vertigo    several episodes per year    Past Surgical History:  Procedure Laterality Date  . BREAST BIOPSY Left 12/28/2016   stereo path pend  . BREAST LUMPECTOMY    . SPLENECTOMY, TOTAL      Prior to Admission medications   Medication Sig Start Date End Date Taking? Authorizing Provider  ALPRAZolam (XANAX) 0.25 MG tablet TAKE (1) TABLET BY MOUTH EVERY DAY AS NEEDED 01/06/17  Yes Juline Patch, MD  Calcium Citrate-Vitamin D (CALCIUM + D PO) Take 1 tablet by mouth 2 (two) times daily.   Yes [provider]  exemestane (AROMASIN) 25 MG tablet Take 25 mg by mouth daily. 08/14/17 08/14/18 Yes [provider]  gabapentin (NEURONTIN) 300 MG capsule TAKE (1) CAPSULE BY MOUTH TWICE DAILY 04/11/17  Yes Juline Patch, MD  Inulin (FIBER CHOICE) 1.5 g CHEW Chew 1 tablet by mouth daily.   Yes [provider]  Magnesium 100 MG CAPS Take 2 capsules by mouth daily.    Yes [provider]  multivitamin-lutein (OCUVITE-LUTEIN) CAPS capsule Take 1 capsule by mouth daily.   Yes [provider]  oxybutynin (DITROPAN-XL) 10 MG 24 hr tablet Take 1 tablet (10 mg total) by mouth at bedtime. 08/28/17  Yes Juline Patch, MD  simvastatin (ZOCOR) 20 MG tablet TAKE ONE TABLET AT BEDTIME. 07/14/17  Yes Juline Patch, MD  Na Sulfate-K Sulfate-Mg Sulf (SUPREP BOWEL PREP KIT) 17.5-3.13-1.6 GM/177ML SOLN Take 1 kit by mouth as directed. 08/30/17   Lucilla Lame, MD    Allergies  as of 08/29/2017 - Review Complete 08/21/2017  Allergen Reaction Noted  . Macrobid [nitrofurantoin]  06/28/2015  . Sulfa antibiotics  06/28/2015    Family History  Problem Relation Age of Onset  . Breast cancer Neg Hx     Social History   Socioeconomic History  . Marital status: Widowed    Spouse name: Not on file  . Number of children: 3  . Years of education: Not on file  . Highest education level: 12th grade  Occupational History  . Occupation: Retired  Scientific laboratory technician  . Financial resource strain: Not hard at all  . Food insecurity:    Worry: Never true    Inability: Never true  . Transportation needs:    Medical: No    Non-medical: No  Tobacco Use  . Smoking status: Never Smoker  . Smokeless tobacco: Never Used  . Tobacco comment: smoking cessation materials not required  Substance and Sexual Activity  . Alcohol use: No    Alcohol/week: 0.0 oz  . Drug use: No  . Sexual activity: Not Currently  Lifestyle  . Physical activity:    Days per week: 2 days    Minutes per session: 60 min  . Stress: Not at all  Relationships  . Social connections:    Talks on phone: Patient refused    Gets together: Patient refused    Attends  religious service: Patient refused    Active member of club or organization: Patient refused    Attends meetings of clubs or organizations: Patient refused    Relationship status: Widowed  . Intimate partner violence:    Fear of current or ex partner: No    Emotionally abused: No    Physically abused: No    Forced sexual activity: No  Other Topics Concern  . Not on file  Social History Narrative  . Not on file    Review of Systems: See HPI, otherwise negative ROS  Physical Exam: Ht _0  (1.676 m)   Wt 151 lb (68.5 kg)   BMI 24.37 kg/m  General:   Alert,  pleasant and cooperative in NAD Head:  Normocephalic and atraumatic. Neck:  Supple; no masses or thyromegaly. Lungs:  Clear throughout to auscultation.    Heart:  Regular rate  and rhythm. Abdomen:  Soft, nontender and nondistended. Normal bowel sounds, without guarding, and without rebound.   Neurologic:  Alert and  oriented x4;  grossly normal neurologically.  Impression/Plan: Melissa Daniels is here for an colonoscopy to be performed for history of colon polyps  Risks, benefits, limitations, and alternatives regarding  colonoscopy have been reviewed with the patient.  Questions have been answered.  All parties agreeable.   Lucilla Lame, MD  09/08/2017, 7:48 AM

## 2017-09-11 ENCOUNTER — Encounter: Payer: Self-pay | Admitting: Gastroenterology

## 2017-09-12 ENCOUNTER — Encounter: Payer: Self-pay | Admitting: Gastroenterology

## 2017-10-17 ENCOUNTER — Ambulatory Visit (INDEPENDENT_AMBULATORY_CARE_PROVIDER_SITE_OTHER): Payer: Medicare Other | Admitting: Family Medicine

## 2017-10-17 ENCOUNTER — Encounter: Payer: Self-pay | Admitting: Family Medicine

## 2017-10-17 VITALS — BP 110/70 | HR 80 | Ht 66.0 in | Wt 152.0 lb

## 2017-10-17 DIAGNOSIS — L02511 Cutaneous abscess of right hand: Secondary | ICD-10-CM | POA: Diagnosis not present

## 2017-10-17 DIAGNOSIS — M7521 Bicipital tendinitis, right shoulder: Secondary | ICD-10-CM | POA: Diagnosis not present

## 2017-10-17 DIAGNOSIS — M7591 Shoulder lesion, unspecified, right shoulder: Secondary | ICD-10-CM

## 2017-10-17 DIAGNOSIS — L03011 Cellulitis of right finger: Secondary | ICD-10-CM | POA: Diagnosis not present

## 2017-10-17 MED ORDER — DOXYCYCLINE HYCLATE 100 MG PO TABS: 100.0000 mg | ORAL_TABLET | Freq: Two times a day (BID) | ORAL | 0 refills | Status: DC

## 2017-10-17 MED ORDER — MELOXICAM 15 MG PO TABS: 15.0000 mg | ORAL_TABLET | Freq: Every day | ORAL | 0 refills | Status: DC

## 2017-10-17 MED ORDER — MUPIROCIN 2 % EX OINT: 1.0000 "application " | TOPICAL_OINTMENT | Freq: Two times a day (BID) | CUTANEOUS | 0 refills | Status: DC

## 2017-10-17 NOTE — Progress Notes (Signed)
Name: Melissa Daniels   MRN: [id number]    DOB: January 21, 1946   Date:10/17/2017       Progress Note  Subjective  Chief Complaint  Chief Complaint  Patient presents with  . Insect Bite    was bit on ring finger of R) hand- happened on Sat May 18- washing windows during the day/ doesn't really know exactly when it occurred  . Shoulder Pain    R) shoulder pain- limited ROM    Shoulder Pain   The pain is present in the right shoulder. This is a new problem. The current episode started more than 1 month ago. There has been no history of extremity trauma. The problem occurs intermittently. The problem has been waxing and waning. The quality of the pain is described as aching. The pain is at a severity of 5/10. The pain is moderate. Pertinent negatives include no fever or tingling.  Hand Pain   The incident occurred 3 to 5 days ago. The incident occurred at home. Injury mechanism: washinh windows/insect bite. The pain is present in the right hand (right ring). The quality of the pain is described as aching. The pain is at a severity of 3/10. Pertinent negatives include no chest pain or tingling.    No problem-specific Assessment & Plan notes found for this encounter.   Past Medical History:  Diagnosis Date  . Anxiety   . Arthritis    hands  . Hypercholesteremia   . ITP (idiopathic thrombocytopenic purpura)   . Neuropathy    bilateral feet  . Osteoporosis   . Vertigo    several episodes per year    Past Surgical History:  Procedure Laterality Date  . BREAST BIOPSY Left 12/28/2016   stereo path pend  . BREAST LUMPECTOMY    . COLONOSCOPY WITH PROPOFOL N/A 09/08/2017   Procedure: COLONOSCOPY WITH PROPOFOL;  Surgeon: Lucilla Lame, MD;  Location: [place];  Service: Endoscopy;  Laterality: N/A;  . POLYPECTOMY  09/08/2017   Procedure: POLYPECTOMY;  Surgeon: Lucilla Lame, MD;  Location: [place];  Service: Endoscopy;;  . SPLENECTOMY, TOTAL      Family History  Problem  Relation Age of Onset  . Breast cancer Neg Hx     Social History   Socioeconomic History  . Marital status: Widowed    Spouse name: Not on file  . Number of children: 3  . Years of education: Not on file  . Highest education level: 12th grade  Occupational History  . Occupation: Retired  Scientific laboratory technician  . Financial resource strain: Not hard at all  . Food insecurity:    Worry: Never true    Inability: Never true  . Transportation needs:    Medical: No    Non-medical: No  Tobacco Use  . Smoking status: Never Smoker  . Smokeless tobacco: Never Used  . Tobacco comment: smoking cessation materials not required  Substance and Sexual Activity  . Alcohol use: No    Alcohol/week: 0.0 oz  . Drug use: No  . Sexual activity: Not Currently  Lifestyle  . Physical activity:    Days per week: 2 days    Minutes per session: 60 min  . Stress: Not at all  Relationships  . Social connections:    Talks on phone: Patient refused    Gets together: Patient refused    Attends religious service: Patient refused    Active member of club or organization: Patient refused    Attends meetings of clubs or  organizations: Patient refused    Relationship status: Widowed  . Intimate partner violence:    Fear of current or ex partner: No    Emotionally abused: No    Physically abused: No    Forced sexual activity: No  Other Topics Concern  . Not on file  Social History Narrative  . Not on file    Allergies  Allergen Reactions  . Macrobid [Nitrofurantoin]   . Sulfa Antibiotics     Outpatient Medications Prior to Visit  Medication Sig Dispense Refill  . ALPRAZolam (XANAX) 0.25 MG tablet TAKE (1) TABLET BY MOUTH EVERY DAY AS NEEDED 30 tablet 0  . Calcium Citrate-Vitamin D (CALCIUM + D PO) Take 1 tablet by mouth 2 (two) times daily.    Marland Kitchen gabapentin (NEURONTIN) 300 MG capsule TAKE (1) CAPSULE BY MOUTH TWICE DAILY 60 capsule 1  . Inulin (FIBER CHOICE) 1.5 g CHEW Chew 1 tablet by mouth daily.     . Magnesium 100 MG CAPS Take 2 capsules by mouth daily.     Marland Kitchen oxybutynin (DITROPAN-XL) 10 MG 24 hr tablet Take 1 tablet (10 mg total) by mouth at bedtime. 90 tablet 0  . simvastatin (ZOCOR) 20 MG tablet TAKE ONE TABLET AT BEDTIME. 30 tablet 0  . exemestane (AROMASIN) 25 MG tablet Take 25 mg by mouth daily.    . multivitamin-lutein (OCUVITE-LUTEIN) CAPS capsule Take 1 capsule by mouth daily.    . Na Sulfate-K Sulfate-Mg Sulf (SUPREP BOWEL PREP KIT) 17.5-3.13-1.6 GM/177ML SOLN Take 1 kit by mouth as directed. 1 Bottle 0   No facility-administered medications prior to visit.     Review of Systems  Constitutional: Negative for chills, fever, malaise/fatigue and weight loss.  HENT: Negative for ear discharge, ear pain and sore throat.   Eyes: Negative for blurred vision.  Respiratory: Negative for cough, sputum production, shortness of breath and wheezing.   Cardiovascular: Negative for chest pain, palpitations and leg swelling.  Gastrointestinal: Negative for abdominal pain, blood in stool, constipation, diarrhea, heartburn, melena and nausea.  Genitourinary: Negative for dysuria, frequency, hematuria and urgency.  Musculoskeletal: Negative for back pain, joint pain, myalgias and neck pain.  Skin: Negative for rash.       Erythema with abscess  Neurological: Negative for dizziness, tingling, sensory change, focal weakness and headaches.  Endo/Heme/Allergies: Negative for environmental allergies and polydipsia. Does not bruise/bleed easily.  Psychiatric/Behavioral: Negative for depression and suicidal ideas. The patient is not nervous/anxious and does not have insomnia.      Objective  Vitals:   10/17/17 0916  BP: 110/70  Pulse: 80  Weight: 152 lb (68.9 kg)  Height: '5\' 6"'  (1.676 m)    Physical Exam  Constitutional: She is oriented to person, place, and time. She appears well-developed and well-nourished.  HENT:  Head: Normocephalic.  Right Ear: External ear normal.  Left Ear:  External ear normal.  Mouth/Throat: Oropharynx is clear and moist.  Eyes: Pupils are equal, round, and reactive to light. Conjunctivae and EOM are normal. Lids are everted and swept, no foreign bodies found. Left eye exhibits no hordeolum. No foreign body present in the left eye. Right conjunctiva is not injected. Left conjunctiva is not injected. No scleral icterus.  Neck: Normal range of motion. Neck supple. No JVD present. No tracheal deviation present. No thyromegaly present.  Cardiovascular: Normal rate, regular rhythm, normal heart sounds and intact distal pulses. Exam reveals no gallop and no friction rub.  No murmur heard. Pulmonary/Chest: Effort normal and breath sounds  normal. No respiratory distress. She has no wheezes. She has no rales.  Abdominal: Soft. Bowel sounds are normal. She exhibits no mass. There is no hepatosplenomegaly. There is no tenderness. There is no rebound and no guarding.  Musculoskeletal: She exhibits no edema.       Right shoulder: She exhibits decreased range of motion and tenderness.  Tender bicep/supraspinatis tendon  Lymphadenopathy:    She has no cervical adenopathy.  Neurological: She is alert and oriented to person, place, and time. She has normal strength. She displays normal reflexes. No cranial nerve deficit.  Skin: Skin is warm. No rash noted. There is erythema.  Erythema surrounding grayish vesicle/ tender   Psychiatric: She has a normal mood and affect. Her mood appears not anxious. She does not exhibit a depressed mood.  Nursing note and vitals reviewed.     Assessment & Plan  Problem List Items Addressed This Visit    None    Visit Diagnoses    Abscess of finger of right hand    -  Primary   1 cmm vesicle/abscess with surrounding erythema/ green purulence removed with incision/doxycycline post coverage   Relevant Medications   doxycycline (VIBRA-TABS) 100 MG tablet   mupirocin ointment (BACTROBAN) 2 %   Cellulitis of finger of right  hand       see above/doxycline 100 mg bid   Relevant Medications   doxycycline (VIBRA-TABS) 100 MG tablet   mupirocin ointment (BACTROBAN) 2 %   Biceps tendinitis of right upper extremity       worsen over 2 month/meloxicam trial   Relevant Medications   meloxicam (MOBIC) 15 MG tablet   Right supraspinatus tendinitis       occur during chemotherapy/ trial meloxicam   Relevant Medications   meloxicam (MOBIC) 15 MG tablet      Meds ordered this encounter  Medications  . meloxicam (MOBIC) 15 MG tablet    Sig: Take 1 tablet (15 mg total) by mouth daily.    Dispense:  30 tablet    Refill:  0  . doxycycline (VIBRA-TABS) 100 MG tablet    Sig: Take 1 tablet (100 mg total) by mouth 2 (two) times daily.    Dispense:  20 tablet    Refill:  0  . mupirocin ointment (BACTROBAN) 2 %    Sig: Apply 1 application topically 2 (two) times daily.    Dispense:  22 g    Refill:  0  Verbal consent for abscess incision and drainage was obtained. Appropriate cleansing performed and # 11 blade incision to abscess/vesicle and purulence drained located on right ring finger. Abscess are 1 cm in size and appear vesicle with surrounding erythema. Procedure tolerated well.  Aftercare Instructions:  Risks of bleeding and recurrence were discussed. Education provided about the possibility of blister formation. Patient has been advised to keep areas clean and dry. Educated about signs and symptoms of infection. All of the patient's questions were answered and the patient's concerns were addressed. Advised to return for re-treatment as needed. Dressing applied to finger    Dr. Otilio Miu [facility] [place] [facility]  10/17/17

## 2017-10-19 ENCOUNTER — Ambulatory Visit: Payer: Medicare Other | Admitting: Family Medicine

## 2017-10-19 ENCOUNTER — Other Ambulatory Visit: Payer: Self-pay | Admitting: Surgery

## 2017-10-19 ENCOUNTER — Other Ambulatory Visit: Payer: Self-pay

## 2017-10-19 ENCOUNTER — Ambulatory Visit (INDEPENDENT_AMBULATORY_CARE_PROVIDER_SITE_OTHER): Payer: Medicare Other | Admitting: Surgery

## 2017-10-19 ENCOUNTER — Encounter: Payer: Self-pay | Admitting: Surgery

## 2017-10-19 VITALS — BP 119/62 | HR 80 | Temp 97.7°F | Ht 66.0 in | Wt 152.0 lb

## 2017-10-19 DIAGNOSIS — L02511 Cutaneous abscess of right hand: Secondary | ICD-10-CM

## 2017-10-19 DIAGNOSIS — L02434 Carbuncle of left upper limb: Secondary | ICD-10-CM

## 2017-10-19 NOTE — Patient Instructions (Signed)
Please give us a call in case you have any questions or concerns.  

## 2017-10-19 NOTE — Progress Notes (Signed)
Surgical Consultation  10/19/2017  Melissa Daniels is an 72 y.o. female.   Chief Complaint  Patient presents with  . New Patient (Initial Visit)    abcess on finger      HPI: At the request of Dr. Ronnald Ramp.  She reports that the last 4 days or so she is been getting infection on the right fourth finger.  She is describes as the pain on the posterior aspect of the finger mild to moderate intensity.  Does not remember any previous injuries.  No fevers no chills.  Dr. Ronnald Ramp saw her 2 days ago and has prescribed doxycycline. She also reports that a similar boil has appeared on her left wrist similar characteristics with some erythema and drainage. She is not taking any immunomodulators or steroids at this time  Past Medical History:  Diagnosis Date  . Anxiety   . Arthritis    hands  . Hypercholesteremia   . ITP (idiopathic thrombocytopenic purpura)   . Malignant neoplasm of upper-outer quadrant of left breast in female, estrogen receptor positive ([place]) 01/10/2017  . Neuropathy    bilateral feet  . Osteoporosis   . Vertigo    several episodes per year    Past Surgical History:  Procedure Laterality Date  . BREAST BIOPSY Left 12/28/2016   stereo path pend  . BREAST LUMPECTOMY    . COLONOSCOPY WITH PROPOFOL N/A 09/08/2017   Procedure: COLONOSCOPY WITH PROPOFOL;  Surgeon: Lucilla Lame, MD;  Location: [place];  Service: Endoscopy;  Laterality: N/A;  . POLYPECTOMY  09/08/2017   Procedure: POLYPECTOMY;  Surgeon: Lucilla Lame, MD;  Location: [place];  Service: Endoscopy;;  . SPLENECTOMY, TOTAL      Family History  Problem Relation Age of Onset  . Breast cancer Neg Hx     Social History:  reports that she has never smoked. She has never used smokeless tobacco. She reports that she does not drink alcohol or use drugs.  Allergies:  Allergies  Allergen Reactions  . Macrobid [Nitrofurantoin]   . Sulfa Antibiotics     Medications reviewed.     ROS Full ROS  performed and is otherwise negative other than what is stated in the HPI    BP 119/62   Pulse 80   Temp 97.7 F (36.5 C) (Oral)   Ht 5\' 6"  (1.676 m)   Wt 68.9 kg (152 lb)   BMI 24.53 kg/m   Physical Exam  Constitutional: She is oriented to person, place, and time. She appears well-developed and well-nourished. No distress.  Pulmonary/Chest: Effort normal. No respiratory distress.  Neurological: She is alert and oriented to person, place, and time. She displays normal reflexes. No cranial nerve deficit or sensory deficit. She exhibits normal muscle tone. Coordination normal.  Skin: Skin is warm and dry. Capillary refill takes less than 2 seconds. She is not diaphoretic.  On the right 4th finger on post die there is a abscess w erythema and fluctuance. On the left wrist there is a 2 cm abscess as well. No evidence of necrotizing infection  Psychiatric: She has a normal mood and affect. Her behavior is normal. Judgment and thought content normal.     Assessment/Plan: 1. Abscess of finger of right hand The patient detail about the need for I&D. She understands.   Procedure Note  1. I/D right 4th finger deep abscess  2. I/D left wrist abscess 3. Right 4th interdigit block  Anesthesia: lidocaine 1% w epi  EBL: minimal  After informed  consent is obtained she was prepped and draped in the sterile fashion.  A fourth digit block was performed using lidocaine plain in the standard fashion. A 15 blade knife the abscess was drained.  Irrigation was obtained.  Attention then was turned to the left wrist with a similar procedure was performed and possible obtained.  Because of the small cavity we were unable to place any packing and dressed it with large Band-Aids.  Patient tolerated procedure well. We sent the specimen for culture.       Caroleen Hamman, MD [facility] General Surgeon

## 2017-10-21 ENCOUNTER — Ambulatory Visit (INDEPENDENT_AMBULATORY_CARE_PROVIDER_SITE_OTHER)
Admission: EM | Admit: 2017-10-21 | Discharge: 2017-10-21 | Disposition: A | Discharge status: Home / Self Care | Payer: Medicare Other | Source: Home / Self Care | Attending: Family Medicine | Admitting: Family Medicine

## 2017-10-21 ENCOUNTER — Other Ambulatory Visit: Payer: Self-pay

## 2017-10-21 ENCOUNTER — Inpatient Hospital Stay
Admission: EM | Admit: 2017-10-21 | Discharge: 2017-10-22 | DRG: 603 | Disposition: A | Discharge status: Home / Self Care | Payer: Medicare Other | Attending: Internal Medicine | Admitting: Internal Medicine

## 2017-10-21 ENCOUNTER — Encounter: Payer: Self-pay | Admitting: Emergency Medicine

## 2017-10-21 DIAGNOSIS — L02414 Cutaneous abscess of left upper limb: Secondary | ICD-10-CM | POA: Diagnosis not present

## 2017-10-21 DIAGNOSIS — G629 Polyneuropathy, unspecified: Secondary | ICD-10-CM | POA: Diagnosis present

## 2017-10-21 DIAGNOSIS — D693 Immune thrombocytopenic purpura: Secondary | ICD-10-CM | POA: Diagnosis present

## 2017-10-21 DIAGNOSIS — E785 Hyperlipidemia, unspecified: Secondary | ICD-10-CM | POA: Diagnosis not present

## 2017-10-21 DIAGNOSIS — Z791 Long term (current) use of non-steroidal anti-inflammatories (NSAID): Secondary | ICD-10-CM | POA: Diagnosis not present

## 2017-10-21 DIAGNOSIS — Z853 Personal history of malignant neoplasm of breast: Secondary | ICD-10-CM | POA: Diagnosis not present

## 2017-10-21 DIAGNOSIS — Z882 Allergy status to sulfonamides status: Secondary | ICD-10-CM | POA: Diagnosis not present

## 2017-10-21 DIAGNOSIS — M19042 Primary osteoarthritis, left hand: Secondary | ICD-10-CM | POA: Diagnosis not present

## 2017-10-21 DIAGNOSIS — M810 Age-related osteoporosis without current pathological fracture: Secondary | ICD-10-CM | POA: Diagnosis not present

## 2017-10-21 DIAGNOSIS — Z881 Allergy status to other antibiotic agents status: Secondary | ICD-10-CM

## 2017-10-21 DIAGNOSIS — L0390 Cellulitis, unspecified: Secondary | ICD-10-CM | POA: Diagnosis not present

## 2017-10-21 DIAGNOSIS — L02419 Cutaneous abscess of limb, unspecified: Secondary | ICD-10-CM

## 2017-10-21 DIAGNOSIS — L02511 Cutaneous abscess of right hand: Secondary | ICD-10-CM

## 2017-10-21 DIAGNOSIS — M19041 Primary osteoarthritis, right hand: Secondary | ICD-10-CM | POA: Diagnosis present

## 2017-10-21 DIAGNOSIS — E7849 Other hyperlipidemia: Secondary | ICD-10-CM | POA: Diagnosis present

## 2017-10-21 DIAGNOSIS — Z79899 Other long term (current) drug therapy: Secondary | ICD-10-CM

## 2017-10-21 DIAGNOSIS — Q8901 Asplenia (congenital): Secondary | ICD-10-CM | POA: Diagnosis not present

## 2017-10-21 DIAGNOSIS — L03114 Cellulitis of left upper limb: Secondary | ICD-10-CM | POA: Diagnosis not present

## 2017-10-21 DIAGNOSIS — Z9081 Acquired absence of spleen: Secondary | ICD-10-CM

## 2017-10-21 LAB — BASIC METABOLIC PANEL
ANION GAP: 6 (ref 5–15)
BUN: 18 mg/dL (ref 6–20)
CO2: 30 mmol/L (ref 22–32)
Calcium: 9.1 mg/dL (ref 8.9–10.3)
Chloride: 102 mmol/L (ref 101–111)
Creatinine, Ser: 0.8 mg/dL (ref 0.44–1.00)
Glucose, Bld: 138 mg/dL — ABNORMAL HIGH (ref 65–99)
POTASSIUM: 4.4 mmol/L (ref 3.5–5.1)
SODIUM: 138 mmol/L (ref 135–145)

## 2017-10-21 LAB — CBC WITH DIFFERENTIAL/PLATELET
BASOS ABS: 0.1 10*3/uL (ref 0–0.1)
Basophils Relative: 1 %
EOS ABS: 0.4 10*3/uL (ref 0–0.7)
EOS PCT: 6 %
HCT: 37.8 % (ref 35.0–47.0)
Hemoglobin: 13.2 g/dL (ref 12.0–16.0)
LYMPHS PCT: 34 %
Lymphs Abs: 2.2 10*3/uL (ref 1.0–3.6)
MCH: 32.4 pg (ref 26.0–34.0)
MCHC: 34.8 g/dL (ref 32.0–36.0)
MCV: 93.1 fL (ref 80.0–100.0)
Monocytes Absolute: 1.1 10*3/uL — ABNORMAL HIGH (ref 0.2–0.9)
Monocytes Relative: 17 %
Neutro Abs: 2.8 10*3/uL (ref 1.4–6.5)
Neutrophils Relative %: 42 %
PLATELETS: 252 10*3/uL (ref 150–440)
RBC: 4.06 MIL/uL (ref 3.80–5.20)
RDW: 13.8 % (ref 11.5–14.5)
WBC: 6.7 10*3/uL (ref 3.6–11.0)

## 2017-10-21 MED ORDER — FAMOTIDINE IN NACL 20-0.9 MG/50ML-% IV SOLN
20.0000 mg | Freq: Two times a day (BID) | INTRAVENOUS | Status: DC
Start: 2017-10-21 — End: 2017-10-22
  Administered 2017-10-21 – 2017-10-22 (×2): 20 mg via INTRAVENOUS
  Filled 2017-10-21: qty 50

## 2017-10-21 MED ORDER — VANCOMYCIN HCL 10 G IV SOLR
1500.0000 mg | INTRAVENOUS | Status: DC
  Administered 2017-10-22: 1500 mg via INTRAVENOUS
  Filled 2017-10-21 (×2): qty 1500

## 2017-10-21 MED ORDER — ACETAMINOPHEN 325 MG PO TABS
650.0000 mg | ORAL_TABLET | Freq: Four times a day (QID) | ORAL | Status: DC | PRN
  Administered 2017-10-22: 650 mg via ORAL
  Filled 2017-10-21: qty 2

## 2017-10-21 MED ORDER — BISACODYL 10 MG RE SUPP: 10.0000 mg | Freq: Every day | RECTAL | Status: DC | PRN

## 2017-10-21 MED ORDER — OXYBUTYNIN CHLORIDE ER 5 MG PO TB24: 10.0000 mg | ORAL_TABLET | Freq: Every day | ORAL | Status: DC

## 2017-10-21 MED ORDER — ACETAMINOPHEN 650 MG RE SUPP: 650.0000 mg | Freq: Four times a day (QID) | RECTAL | Status: DC | PRN

## 2017-10-21 MED ORDER — MUPIROCIN 2 % EX OINT
1.0000 "application " | TOPICAL_OINTMENT | Freq: Two times a day (BID) | CUTANEOUS | Status: DC
  Administered 2017-10-21 – 2017-10-22 (×2): 1 via TOPICAL
  Filled 2017-10-21 (×2): qty 22

## 2017-10-21 MED ORDER — ONDANSETRON HCL 4 MG PO TABS: 4.0000 mg | ORAL_TABLET | Freq: Four times a day (QID) | ORAL | Status: DC | PRN

## 2017-10-21 MED ORDER — HEPARIN SODIUM (PORCINE) 5000 UNIT/ML IJ SOLN
5000.0000 [IU] | Freq: Three times a day (TID) | INTRAMUSCULAR | Status: DC
  Administered 2017-10-21 – 2017-10-22 (×2): 5000 [IU] via SUBCUTANEOUS
  Filled 2017-10-21 (×2): qty 1

## 2017-10-21 MED ORDER — FAMOTIDINE IN NACL 20-0.9 MG/50ML-% IV SOLN
INTRAVENOUS | Status: AC
  Administered 2017-10-21: 20 mg via INTRAVENOUS
  Filled 2017-10-21: qty 50

## 2017-10-21 MED ORDER — ALPRAZOLAM 0.25 MG PO TABS: 0.2500 mg | ORAL_TABLET | Freq: Two times a day (BID) | ORAL | Status: DC | PRN

## 2017-10-21 MED ORDER — ONDANSETRON HCL 4 MG/2ML IJ SOLN: 4.0000 mg | Freq: Four times a day (QID) | INTRAMUSCULAR | Status: DC | PRN

## 2017-10-21 MED ORDER — SIMVASTATIN 10 MG PO TABS
20.0000 mg | ORAL_TABLET | Freq: Every day | ORAL | Status: DC
  Filled 2017-10-21: qty 2

## 2017-10-21 MED ORDER — VANCOMYCIN HCL IN DEXTROSE 1-5 GM/200ML-% IV SOLN
1000.0000 mg | Freq: Once | INTRAVENOUS | Status: AC
  Administered 2017-10-21: 1000 mg via INTRAVENOUS
  Filled 2017-10-21: qty 200

## 2017-10-21 MED ORDER — SODIUM CHLORIDE 0.9 % IV SOLN
INTRAVENOUS | Status: DC
  Administered 2017-10-21 – 2017-10-22 (×2): via INTRAVENOUS

## 2017-10-21 MED ORDER — MELOXICAM 7.5 MG PO TABS
15.0000 mg | ORAL_TABLET | Freq: Every day | ORAL | Status: DC
  Administered 2017-10-22: 15 mg via ORAL
  Filled 2017-10-21 (×2): qty 2

## 2017-10-21 MED ORDER — GABAPENTIN 300 MG PO CAPS
300.0000 mg | ORAL_CAPSULE | Freq: Two times a day (BID) | ORAL | Status: DC
  Administered 2017-10-21 – 2017-10-22 (×2): 300 mg via ORAL
  Filled 2017-10-21 (×2): qty 1

## 2017-10-21 MED ORDER — DOCUSATE SODIUM 100 MG PO CAPS
100.0000 mg | ORAL_CAPSULE | Freq: Two times a day (BID) | ORAL | Status: DC
  Administered 2017-10-21 – 2017-10-22 (×2): 100 mg via ORAL
  Filled 2017-10-21 (×2): qty 1

## 2017-10-21 NOTE — Progress Notes (Signed)
Pharmacy Antibiotic Note  Melissa Daniels is a 72 y.o. female admitted on 10/21/2017 with cellulitis.  Pharmacy has been consulted for vancomycin dosing.  Plan: Vancomycin 1500mg   IV every 24 hours.  Goal trough 10-15 mcg/mL.  Height: 5\' 6"  (167.6 cm) Weight: 153 lb (69.4 kg) IBW/kg (Calculated) : 59.3  Temp (24hrs), Avg:98 F (36.7 C), Min:97.7 F (36.5 C), Max:98.3 F (36.8 C)  Recent Labs  Lab 10/21/17 1515  WBC 6.7  CREATININE 0.80    Estimated Creatinine Clearance: 60.4 mL/min (by C-G formula based on SCr of 0.8 mg/dL).    Allergies  Allergen Reactions  . Macrobid [Nitrofurantoin]   . Sulfa Antibiotics Other (See Comments)    Platelets drop     Antimicrobials this admission: Anti-infectives (From admission, onward)   Start     Dose/Rate Route Frequency Ordered Stop   10/22/17 0100  vancomycin (VANCOCIN) 1,500 mg in sodium chloride 0.9 % 500 mL IVPB     1,500 mg 250 mL/hr over 120 Minutes Intravenous Every 24 hours 10/21/17 2133     10/21/17 1615  vancomycin (VANCOCIN) IVPB 1000 mg/200 mL premix     1,000 mg 200 mL/hr over 60 Minutes Intravenous  Once 10/21/17 1602 10/21/17 1725       Microbiology results: No results found for this or any previous visit (from the past 240 hour(s)).   Thank you for allowing pharmacy to be a part of this patient's care.  Donna Christen Saksham Akkerman 10/21/2017 9:33 PM

## 2017-10-21 NOTE — ED Provider Notes (Signed)
[facility] Emergency Department Provider Note   ____________________________________________    I have reviewed the triage vital signs and the nursing notes.   HISTORY  Chief Complaint Cellulitis     HPI Melissa Daniels is a 72 y.o. female with a history of asplenia who presents with complaints of redness to the left arm.  Patient had abscess lanced on the left wrist and right hand last week, has been on doxycycline.  However noticed redness tracking up her left arm today and went to urgent care, sent from urgent care to the emergency department.  Denies fevers or chills.  Reports compliance with medications.  No nausea or vomiting.  Does have some soreness in the left forearm and left upper arm   Past Medical History:  Diagnosis Date  . Anxiety   . Arthritis    hands  . Hypercholesteremia   . ITP (idiopathic thrombocytopenic purpura)   . Malignant neoplasm of upper-outer quadrant of left breast in female, estrogen receptor positive ([place]) 01/10/2017  . Neuropathy    bilateral feet  . Osteoporosis   . Vertigo    several episodes per year    Patient Active Problem List   Diagnosis Date Noted  . Cellulitis 10/21/2017  . 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 Personal history of colonic polyps   . Benign neoplasm of cecum   . Benign neoplasm of transverse colon   . Benign neoplasm of ascending colon   . Malignant neoplasm of upper-outer quadrant of left breast in female, estrogen receptor positive ([place]) 01/10/2017  . Neuropathy 10/31/2016  . Taking medication for chronic disease 10/31/2016  . Primary osteoarthritis of right hip 10/31/2016  . 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[place]) 10/19/2015  . Menopause 10/19/2015  . Hormone replacement therapy (HRT) 10/19/2015  . 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 Routine general medical examination at a health care facility 10/10/2014  . 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 Colon polyp 10/10/2014  . Episodic paroxysmal anxiety disorder 10/10/2014    Past Surgical History:  Procedure Laterality Date  . BREAST BIOPSY Left 12/28/2016   stereo path pend  . BREAST LUMPECTOMY    . COLONOSCOPY WITH PROPOFOL N/A 09/08/2017   Procedure: COLONOSCOPY WITH PROPOFOL;  Surgeon: Lucilla Lame, MD;  Location: [place];  Service: Endoscopy;  Laterality: N/A;  . POLYPECTOMY  09/08/2017   Procedure: POLYPECTOMY;  Surgeon: Lucilla Lame, MD;  Location: [place];  Service: Endoscopy;;  . SPLENECTOMY, TOTAL      Prior to Admission medications   Medication Sig Start Date End Date Taking? Authorizing Provider  ALPRAZolam (XANAX) 0.25 MG tablet TAKE (1) TABLET BY MOUTH EVERY DAY AS NEEDED 01/06/17  Yes Juline Patch, MD  Calcium Citrate-Vitamin D (CALCIUM + D PO) Take 1 tablet by mouth 2 (two) times daily.   Yes [provider]  doxycycline (VIBRA-TABS) 100 MG tablet Take 1 tablet (100 mg total) by mouth 2 (two) times daily. 10/17/17  Yes Juline Patch, MD  gabapentin (NEURONTIN) 300 MG capsule TAKE (1) CAPSULE BY MOUTH TWICE DAILY 04/11/17  Yes Juline Patch, MD  Inulin (FIBER CHOICE) 1.5 g CHEW Chew 1 tablet by mouth daily.   Yes [provider]  Magnesium 100 MG CAPS Take 2 capsules by mouth daily.    Yes [provider]  meloxicam (MOBIC) 15 MG tablet Take 1 tablet (15 mg total) by mouth daily. 10/17/17  Yes Juline Patch, MD  mupirocin ointment (BACTROBAN) 2 % Apply 1 application topically 2 (two) times daily. 10/17/17  Yes Juline Patch, MD  oxybutynin (DITROPAN-XL) 10 MG 24 hr tablet Take 1 tablet (10 mg total) by mouth at bedtime. 08/28/17  Yes Juline Patch, MD  simvastatin (ZOCOR) 20 MG tablet TAKE ONE TABLET AT BEDTIME. 07/14/17  Yes Juline Patch, MD     Allergies Macrobid [nitrofurantoin] and Sulfa antibiotics  Family History  Problem Relation Age of Onset  . Breast cancer Neg Hx      Social History Social History   Tobacco Use  . Smoking status: Never Smoker  . Smokeless tobacco: Never Used  . Tobacco comment: smoking cessation materials not required  Substance Use Topics  . Alcohol use: No    Alcohol/week: 0.0 oz  . Drug use: No    Review of Systems  Constitutional: No fever/chills Eyes: No visual changes.  ENT: No neck pain Cardiovascular: Denies chest pain. Respiratory: No cough Gastrointestinal: No nausea, no vomiting.   Genitourinary: Negative for dysuria. Musculoskeletal: As above Skin: As above Neurological: Negative for headache   ____________________________________________   PHYSICAL EXAM:  VITAL SIGNS: ED Triage Vitals [10/21/17 1512]  Enc Vitals Group     BP 126/64     Pulse Rate 80     Resp 16     Temp 98.3 F (36.8 C)     Temp Source Oral     SpO2 98 %     Weight      Height      Head Circumference      Peak Flow      Pain Score 0     Pain Loc      Pain Edu?      Excl. in [place]?     Constitutional: Alert and oriented. No acute distress. Pleasant and interactive Eyes: Conjunctivae are normal.   Nose: No congestion/rhinnorhea. Mouth/Throat: Mucous membranes are moist.    Cardiovascular: Normal rate, regular rhythm. Grossly normal heart sounds.  Good peripheral circulation. Respiratory: Normal respiratory effort.  No retractions. Lungs CTAB. Gastrointestinal: Soft and nontender. No distention.  No CVA tenderness. Genitourinary: deferred Musculoskeletal: Left arm: Erythema streaking up the left forearm and to the medial aspect of the left upper arm, extends from abscess consistent with cellulitis, mildly tender along the area of redness Neurologic:  Normal speech and language. No gross focal neurologic deficits are appreciated.  Skin:  Skin is warm, dry and intact.  As above Psychiatric: Mood and affect are normal. Speech and behavior are normal.  ____________________________________________   LABS (all labs  ordered are listed, but only abnormal results are displayed)  Labs Reviewed  CBC WITH DIFFERENTIAL/PLATELET - Abnormal; Notable for the following components:      Result Value   Monocytes Absolute 1.1 (*)    All other components within normal limits  BASIC METABOLIC PANEL - Abnormal; Notable for the following components:   Glucose, Bld 138 (*)    All other components within normal limits  CULTURE, BLOOD (ROUTINE X 2)  CULTURE, BLOOD (ROUTINE X 2)   ____________________________________________  EKG  None ____________________________________________  RADIOLOGY  None ____________________________________________   PROCEDURES  Procedure(s) performed: No  Procedures   Critical Care performed: No ____________________________________________   INITIAL IMPRESSION / ASSESSMENT AND PLAN / ED COURSE  Pertinent labs & imaging results that were available during my care of the patient were reviewed by me and considered in my medical decision making (see chart for details).  Patient  with exam most consistent with cellulitis.  Labs are reassuring.  Patient has been on appropriate antibiotic, has failed outpatient treatment, will require IV medications.  IV vancomycin ordered.  Admitted to the hospital service    ____________________________________________   FINAL CLINICAL IMPRESSION(S) / ED DIAGNOSES  Final diagnoses:  Cellulitis of left upper extremity        Note:  This document was prepared using Dragon voice recognition software and may include unintentional dictation errors.    Lavonia Drafts, MD 10/21/17 [id number]

## 2017-10-21 NOTE — H&P (Signed)
History and Physical    Melissa Daniels HYW:[id number] DOB: 1946/01/17 DOA: 10/21/2017  Referring physician: Dr. Corky Downs PCP: Juline Patch, MD  Specialists: none  Chief Complaint: left hand/arm redness  HPI: Melissa Daniels is a 72 y.o. female has a past medical history significant for breast cancer, ITP, and asplenia now with progressive redness and warmth of left hand and arm despite po Doxycycline. Had an abscess drained earlier in the week and was started on Doxycycline 5 days ago. Now with progressive redness with warmth and pain. No fever. Denies N/V/D. Denies CP or SOB. She is now admitted  Review of Systems: The patient denies anorexia, fever, weight loss,, vision loss, decreased hearing, hoarseness, chest pain, syncope, dyspnea on exertion, peripheral edema, balance deficits, hemoptysis, abdominal pain, melena, hematochezia, severe indigestion/heartburn, hematuria, incontinence, genital sores, muscle weakness,  transient blindness, difficulty walking, depression, unusual weight change, abnormal bleeding, enlarged lymph nodes, angioedema, and breast masses.   Past Medical History:  Diagnosis Date  . Anxiety   . Arthritis    hands  . Hypercholesteremia   . ITP (idiopathic thrombocytopenic purpura)   . Malignant neoplasm of upper-outer quadrant of left breast in female, estrogen receptor positive ([place]) 01/10/2017  . Neuropathy    bilateral feet  . Osteoporosis   . Vertigo    several episodes per year   Past Surgical History:  Procedure Laterality Date  . BREAST BIOPSY Left 12/28/2016   stereo path pend  . BREAST LUMPECTOMY    . COLONOSCOPY WITH PROPOFOL N/A 09/08/2017   Procedure: COLONOSCOPY WITH PROPOFOL;  Surgeon: Lucilla Lame, MD;  Location: [place];  Service: Endoscopy;  Laterality: N/A;  . POLYPECTOMY  09/08/2017   Procedure: POLYPECTOMY;  Surgeon: Lucilla Lame, MD;  Location: [place];  Service: Endoscopy;;  . SPLENECTOMY, TOTAL     Social History:   reports that she has never smoked. She has never used smokeless tobacco. She reports that she does not drink alcohol or use drugs.  Allergies  Allergen Reactions  . Macrobid [Nitrofurantoin]   . Sulfa Antibiotics     Family History  Problem Relation Age of Onset  . Breast cancer Neg Hx     Prior to Admission medications   Medication Sig Start Date End Date Taking? Authorizing Provider  ALPRAZolam (XANAX) 0.25 MG tablet TAKE (1) TABLET BY MOUTH EVERY DAY AS NEEDED 01/06/17   Juline Patch, MD  Calcium Citrate-Vitamin D (CALCIUM + D PO) Take 1 tablet by mouth 2 (two) times daily.    [provider]  doxycycline (VIBRA-TABS) 100 MG tablet Take 1 tablet (100 mg total) by mouth 2 (two) times daily. 10/17/17   Juline Patch, MD  exemestane (AROMASIN) 25 MG tablet Take 1 tablet by mouth 1 day or 1 dose. 08/14/17 08/14/18  [provider]  gabapentin (NEURONTIN) 300 MG capsule TAKE (1) CAPSULE BY MOUTH TWICE DAILY 04/11/17   Juline Patch, MD  Inulin (FIBER CHOICE) 1.5 g CHEW Chew 1 tablet by mouth daily.    [provider]  Magnesium 100 MG CAPS Take 2 capsules by mouth daily.     [provider]  meloxicam (MOBIC) 15 MG tablet Take 1 tablet (15 mg total) by mouth daily. 10/17/17   Juline Patch, MD  mupirocin ointment (BACTROBAN) 2 % Apply 1 application topically 2 (two) times daily. 10/17/17   Juline Patch, MD  oxybutynin (DITROPAN-XL) 10 MG 24 hr tablet Take 1 tablet (10 mg total) by  mouth at bedtime. 08/28/17   Juline Patch, MD  simvastatin (ZOCOR) 20 MG tablet TAKE ONE TABLET AT BEDTIME. 07/14/17   Juline Patch, MD   Physical Exam: Vitals:   10/21/17 1512 10/21/17 1554 10/21/17 1600 10/21/17 1630  BP: 126/64 129/72 133/75 126/69  Pulse: 80 79 76 70  Resp: 16 16 16 16   Temp: 98.3 F (36.8 C)     TempSrc: Oral     SpO2: 98% 98% 98% 97%     General:  No apparent distress, WDWN, [place]/AT  Eyes: PERRL, EOMI, no scleral icterus, conjunctiva  clear  ENT: moist oropharynx without exudate, TM's benign, dentition good  Neck: supple, no lymphadenopathy. No bruits or thyromegaly  Cardiovascular: regular rate without MRG; 2+ peripheral pulses, no JVD, no peripheral edema  Respiratory: CTA biL, good air movement without wheezing, rhonchi or crackled. Respiratory effort normal  Abdomen: soft, non tender to palpation, positive bowel sounds, no guarding, no rebound  Skin: pustule noted to right ring finger and left hand and arm to biceps has erythema, warmth and tenderness. Wound below left thumb noted.  Musculoskeletal: normal bulk and tone, no joint swelling  Psychiatric: normal mood and affect, A&OX3  Neurologic: CN 2-12 grossly intact, Motor strength 5/5 in all 4 groups with symmetric DTR's and non-focal sensory exam  Labs on Admission:  Basic Metabolic Panel: Recent Labs  Lab 10/21/17 1515  NA 138  K 4.4  CL 102  CO2 30  GLUCOSE 138*  BUN 18  CREATININE 0.80  CALCIUM 9.1   Liver Function Tests: No results for input(s): AST, ALT, ALKPHOS, BILITOT, PROT, ALBUMIN in the last 168 hours. No results for input(s): LIPASE, AMYLASE in the last 168 hours. No results for input(s): AMMONIA in the last 168 hours. CBC: Recent Labs  Lab 10/21/17 1515  WBC 6.7  NEUTROABS 2.8  HGB 13.2  HCT 37.8  MCV 93.1  PLT 252   Cardiac Enzymes: No results for input(s): CKTOTAL, CKMB, CKMBINDEX, TROPONINI in the last 168 hours.  BNP (last 3 results) No results for input(s): BNP in the last 8760 hours.  ProBNP (last 3 results) No results for input(s): PROBNP in the last 8760 hours.  CBG: No results for input(s): GLUCAP in the last 168 hours.  Radiological Exams on Admission: No results found.  EKG: Independently reviewed.  Assessment/Plan Principal Problem:   Cellulitis Active Problems:   Familial multiple lipoprotein-type hyperlipidemia   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[place])   Asplenia   Will admit to floor  and begin IV fluids and IV ABX. Cultures sent. Will consult ID. Repeat labs in AM  Diet: heart healthy Fluids: NS@75  DVT Prophylaxis: SQ Heparin  Code Status: FULL  Family Communication: yes  Disposition Plan: home  Time spent: 59min

## 2017-10-21 NOTE — ED Provider Notes (Signed)
[facility]    CSN: [id number] Arrival date & time: 10/21/17  1335     History   Chief Complaint Chief Complaint  Patient presents with  . Wound Check    HPI Crystle Carelli is a 72 y.o. female.   72 yo female with a c/o hand infections which were drained and treated last week, now worsening symptoms of red skin streaks and tenderness to left forearm and upper arm (biceps) area that started today. Patient initially seen by PCP early last week and had a right finger abscess drained plus also started on oral antibiotic. Two days later (last Thursday) patient was seen by General Surgeon and had a left wrist skin abscess I&D as well. Has continued oral antibiotic, however today patient noticed new symptoms of pain, tenderness and red streaks going up her left forearm and up to the biceps area. Denies fevers or chills.   The history is provided by the patient.  Wound Check     Past Medical History:  Diagnosis Date  . Anxiety   . Arthritis    hands  . Hypercholesteremia   . ITP (idiopathic thrombocytopenic purpura)   . Malignant neoplasm of upper-outer quadrant of left breast in female, estrogen receptor positive ([place]) 01/10/2017  . Neuropathy    bilateral feet  . Osteoporosis   . Vertigo    several episodes per year    Patient Active Problem List   Diagnosis Date Noted  . Personal history of colonic polyps   . Benign neoplasm of cecum   . Benign neoplasm of transverse colon   . Benign neoplasm of ascending colon   . Malignant neoplasm of upper-outer quadrant of left breast in female, estrogen receptor positive ([place]) 01/10/2017  . Neuropathy 10/31/2016  . Taking medication for chronic disease 10/31/2016  . Primary osteoarthritis of right hip 10/31/2016  . 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[place]) 10/19/2015  . Menopause 10/19/2015  . Hormone replacement therapy (HRT) 10/19/2015  . 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 Routine general  medical examination at a health care facility 10/10/2014  . 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 Female stress incontinence 10/10/2014  . Colon polyp 10/10/2014  . Episodic paroxysmal anxiety disorder 10/10/2014    Past Surgical History:  Procedure Laterality Date  . BREAST BIOPSY Left 12/28/2016   stereo path pend  . BREAST LUMPECTOMY    . COLONOSCOPY WITH PROPOFOL N/A 09/08/2017   Procedure: COLONOSCOPY WITH PROPOFOL;  Surgeon: Lucilla Lame, MD;  Location: [facility];  Service: Endoscopy;  Laterality: N/A;  . POLYPECTOMY  09/08/2017   Procedure: POLYPECTOMY;  Surgeon: Lucilla Lame, MD;  Location: [place];  Service: Endoscopy;;  . SPLENECTOMY, TOTAL      OB History   None      Home Medications    Prior to Admission medications   Medication Sig Start Date End Date Taking? Authorizing Provider  ALPRAZolam (XANAX) 0.25 MG tablet TAKE (1) TABLET BY MOUTH EVERY DAY AS NEEDED 01/06/17   Juline Patch, MD  Calcium Citrate-Vitamin D (CALCIUM + D PO) Take 1 tablet by mouth 2 (two) times daily.    [provider]  doxycycline (VIBRA-TABS) 100 MG tablet Take 1 tablet (100 mg total) by mouth 2 (two) times daily. 10/17/17   Juline Patch, MD  exemestane (AROMASIN) 25 MG tablet Take 1 tablet by mouth 1 day or 1 dose. 08/14/17 08/14/18  [provider]  gabapentin (NEURONTIN) 300 MG capsule TAKE (1) CAPSULE BY MOUTH TWICE  DAILY 04/11/17   Juline Patch, MD  Inulin (FIBER CHOICE) 1.5 g CHEW Chew 1 tablet by mouth daily.    [provider]  Magnesium 100 MG CAPS Take 2 capsules by mouth daily.     [provider]  meloxicam (MOBIC) 15 MG tablet Take 1 tablet (15 mg total) by mouth daily. 10/17/17   Juline Patch, MD  mupirocin ointment (BACTROBAN) 2 % Apply 1 application topically 2 (two) times daily. 10/17/17   Juline Patch, MD  oxybutynin (DITROPAN-XL) 10 MG 24 hr tablet Take 1 tablet (10 mg total) by mouth at bedtime. 08/28/17    Juline Patch, MD  simvastatin (ZOCOR) 20 MG tablet TAKE ONE TABLET AT BEDTIME. 07/14/17   Juline Patch, MD    Family History Family History  Problem Relation Age of Onset  . Breast cancer Neg Hx     Social History Social History   Tobacco Use  . Smoking status: Never Smoker  . Smokeless tobacco: Never Used  . Tobacco comment: smoking cessation materials not required  Substance Use Topics  . Alcohol use: No    Alcohol/week: 0.0 oz  . Drug use: No     Allergies   Macrobid [nitrofurantoin] and Sulfa antibiotics   Review of Systems Review of Systems   Physical Exam Triage Vital Signs ED Triage Vitals  Enc Vitals Group     BP 10/21/17 1349 (!) 115/103     Pulse Rate 10/21/17 1349 80     Resp 10/21/17 1349 16     Temp 10/21/17 1349 98.1 F (36.7 C)     Temp Source 10/21/17 1349 Oral     SpO2 10/21/17 1349 98 %     Weight 10/21/17 1351 152 lb (68.9 kg)     Height 10/21/17 1351 5\' 6"  (1.676 m)     Head Circumference --      Peak Flow --      Pain Score 10/21/17 1351 4     Pain Loc --      Pain Edu? --      Excl. in [place]? --    No data found.  Updated Vital Signs BP (!) 115/103 (BP Location: Right Arm)   Pulse 80   Temp 98.1 F (36.7 C) (Oral)   Resp 16   Ht 5\' 6"  (1.676 m)   Wt 152 lb (68.9 kg)   SpO2 98%   BMI 24.53 kg/m   Visual Acuity Right Eye Distance:   Left Eye Distance:   Bilateral Distance:    Right Eye Near:   Left Eye Near:    Bilateral Near:     Physical Exam  Constitutional: She appears well-developed and well-nourished. No distress.  Skin: She is not diaphoretic. There is erythema (warmth and tenderness to palpation over skin on left forearm and left upper arm).     Erythematous, ulcerative lesion on left wrist  Nursing note and vitals reviewed.    UC Treatments / Results  Labs (all labs ordered are listed, but only abnormal results are displayed) Labs Reviewed - No data to display  EKG None  Radiology No results  found.  Procedures Procedures (including critical care time)  Medications Ordered in UC Medications - No data to display  Initial Impression / Assessment and Plan / UC Course  I have reviewed the triage vital signs and the nursing notes.  Pertinent labs & imaging results that were available during my care of the patient were reviewed by  me and considered in my medical decision making (see chart for details).      Final Clinical Impressions(s) / UC Diagnoses   Final diagnoses:  Abscess of wrist  Cellulitis of arm, left     Discharge Instructions     Due to failed outpatient therapy, worsening symptoms of cellulitis, s/p I&D wrist abscess, recommend patient go to Emergency Department for further management    ED Prescriptions    None     1.diagnosis reviewed with patient 2. Due to worsening symptoms despite I&D and oral antibiotic treatment, recommend patient go to Emergency Department for further evaluation and management   Controlled Substance Prescriptions [place] Controlled Substance Registry consulted? Not Applicable   Norval Gable, MD 10/21/17 [phone]

## 2017-10-21 NOTE — ED Notes (Signed)
Pt reports having two spots that came up this week that had to be lanced by her PCP this Wednesday. One on right ring finger and one on base of left thumb. Pt noticed some pain while lifting her left arm today. Since, she has noticed a streaking to her left lower forearm that goes to left upper arm. Streaking is linear on left forearm but is more generalized on upper left arm.  EDP at bedside.

## 2017-10-21 NOTE — Discharge Instructions (Signed)
Due to failed outpatient therapy, worsening symptoms of cellulitis, s/p I&D wrist abscess, recommend patient go to Emergency Department for further management

## 2017-10-21 NOTE — ED Notes (Signed)
First Nurse Note:  Patient was sent over from [facility] for possible cellulitis of her left arm.  Patient states, "they told me to hurry over before it keeps spreading up my arm."

## 2017-10-21 NOTE — ED Triage Notes (Signed)
Pt to ED via POv from [facility] for possible cellulitis. Pt had 2 abscess that were lanced last week. This morning pt noticed that her arm was sore and she saw a red streak going up her arm. Pt denies fever, chills, nausea or vomiting. Pt is in NAD at this time.

## 2017-10-21 NOTE — ED Triage Notes (Signed)
Pt reports she had abscess to both hands lanced by surgeon on Thursday. This a.m. Noticed a red streak up her arm and bicep muscle feels sore.

## 2017-10-21 NOTE — ED Notes (Signed)
Pt transported by Onalee Hua, RN to [phone]. Leigh, RN aware that pt is on her way.

## 2017-10-22 DIAGNOSIS — E785 Hyperlipidemia, unspecified: Secondary | ICD-10-CM | POA: Diagnosis not present

## 2017-10-22 DIAGNOSIS — M19042 Primary osteoarthritis, left hand: Secondary | ICD-10-CM | POA: Diagnosis not present

## 2017-10-22 DIAGNOSIS — Z881 Allergy status to other antibiotic agents status: Secondary | ICD-10-CM | POA: Diagnosis not present

## 2017-10-22 DIAGNOSIS — Z853 Personal history of malignant neoplasm of breast: Secondary | ICD-10-CM | POA: Diagnosis not present

## 2017-10-22 DIAGNOSIS — L02414 Cutaneous abscess of left upper limb: Secondary | ICD-10-CM | POA: Diagnosis not present

## 2017-10-22 DIAGNOSIS — Q8901 Asplenia (congenital): Secondary | ICD-10-CM | POA: Diagnosis not present

## 2017-10-22 DIAGNOSIS — G629 Polyneuropathy, unspecified: Secondary | ICD-10-CM | POA: Diagnosis not present

## 2017-10-22 DIAGNOSIS — Z79899 Other long term (current) drug therapy: Secondary | ICD-10-CM | POA: Diagnosis not present

## 2017-10-22 DIAGNOSIS — L0390 Cellulitis, unspecified: Secondary | ICD-10-CM | POA: Diagnosis not present

## 2017-10-22 DIAGNOSIS — E7849 Other hyperlipidemia: Secondary | ICD-10-CM | POA: Diagnosis not present

## 2017-10-22 DIAGNOSIS — D693 Immune thrombocytopenic purpura: Secondary | ICD-10-CM | POA: Diagnosis not present

## 2017-10-22 DIAGNOSIS — M19041 Primary osteoarthritis, right hand: Secondary | ICD-10-CM | POA: Diagnosis not present

## 2017-10-22 DIAGNOSIS — Z791 Long term (current) use of non-steroidal anti-inflammatories (NSAID): Secondary | ICD-10-CM | POA: Diagnosis not present

## 2017-10-22 DIAGNOSIS — M810 Age-related osteoporosis without current pathological fracture: Secondary | ICD-10-CM | POA: Diagnosis not present

## 2017-10-22 DIAGNOSIS — Z9081 Acquired absence of spleen: Secondary | ICD-10-CM | POA: Diagnosis not present

## 2017-10-22 DIAGNOSIS — Z882 Allergy status to sulfonamides status: Secondary | ICD-10-CM | POA: Diagnosis not present

## 2017-10-22 DIAGNOSIS — L03114 Cellulitis of left upper limb: Secondary | ICD-10-CM | POA: Diagnosis not present

## 2017-10-22 LAB — COMPREHENSIVE METABOLIC PANEL
ALT: 21 U/L (ref 14–54)
AST: 26 U/L (ref 15–41)
Albumin: 3.2 g/dL — ABNORMAL LOW (ref 3.5–5.0)
Alkaline Phosphatase: 67 U/L (ref 38–126)
Anion gap: 4 — ABNORMAL LOW (ref 5–15)
BUN: 14 mg/dL (ref 6–20)
CHLORIDE: 109 mmol/L (ref 101–111)
CO2: 27 mmol/L (ref 22–32)
CREATININE: 0.66 mg/dL (ref 0.44–1.00)
Calcium: 8.8 mg/dL — ABNORMAL LOW (ref 8.9–10.3)
GFR calc Af Amer: >60 mL/min (ref >60)
Glucose, Bld: 96 mg/dL (ref 65–99)
Potassium: 3.8 mmol/L (ref 3.5–5.1)
Sodium: 140 mmol/L (ref 135–145)
Total Bilirubin: 0.7 mg/dL (ref 0.3–1.2)
Total Protein: 6.2 g/dL — ABNORMAL LOW (ref 6.5–8.1)

## 2017-10-22 LAB — CBC
HCT: 37.5 % (ref 35.0–47.0)
Hemoglobin: 12.8 g/dL (ref 12.0–16.0)
MCH: 31.7 pg (ref 26.0–34.0)
MCHC: 34.2 g/dL (ref 32.0–36.0)
MCV: 92.9 fL (ref 80.0–100.0)
PLATELETS: 223 10*3/uL (ref 150–440)
RBC: 4.04 MIL/uL (ref 3.80–5.20)
RDW: 13.6 % (ref 11.5–14.5)
WBC: 5.1 10*3/uL (ref 3.6–11.0)

## 2017-10-22 LAB — WOUND CULTURE: Organism ID, Bacteria: NONE SEEN

## 2017-10-22 LAB — GLUCOSE, CAPILLARY: Glucose-Capillary: 116 mg/dL — ABNORMAL HIGH (ref 65–99)

## 2017-10-22 MED ORDER — LACTINEX PO CHEW: 1.0000 | CHEWABLE_TABLET | Freq: Three times a day (TID) | ORAL | 0 refills | Status: AC

## 2017-10-22 MED ORDER — FAMOTIDINE 20 MG PO TABS: 20.0000 mg | ORAL_TABLET | Freq: Two times a day (BID) | ORAL | Status: DC

## 2017-10-22 MED ORDER — CLINDAMYCIN HCL 300 MG PO CAPS: 300.0000 mg | ORAL_CAPSULE | Freq: Three times a day (TID) | ORAL | 0 refills | Status: AC

## 2017-10-22 NOTE — Progress Notes (Signed)
Pt provided with discharge info and scripts. Pt and daughter asked and answered questions. Denies further needs. Pt aware to follow up with PCP and surgeon. Pt aware to take all abx. Denies further needs or questions. NAD.

## 2017-10-22 NOTE — Discharge Instructions (Signed)

## 2017-10-22 NOTE — Progress Notes (Signed)
PHARMACIST - PHYSICIAN COMMUNICATION  CONCERNING: IV to Oral Route Change Policy  RECOMMENDATION: This patient is receiving famotidine by the intravenous route.  Based on criteria approved by the Pharmacy and Therapeutics Committee, the intravenous medication(s) is/are being converted to the equivalent oral dose form(s).   DESCRIPTION: These criteria include:  The patient is eating (either orally or via tube) and/or has been taking other orally administered medications for a least 24 hours  The patient has no evidence of active gastrointestinal bleeding or impaired GI absorption (gastrectomy, short bowel, patient on TNA or NPO).  If you have questions about this conversion, please contact the Pharmacy Department  []   [phone] )  [facility] [x]   [phone] )  [facility] []   [phone] )  [facility] []   [phone] )  [facility] []   [phone] )  [place], [facility] 10/22/2017 10:46 AM

## 2017-10-22 NOTE — Plan of Care (Signed)
Adequate for discharge. Pt able to verbalize plan of care.

## 2017-10-22 NOTE — Consult Note (Signed)
SURGICAL CONSULTATION NOTE (initial) - cpt: 16109  HISTORY OF PRESENT ILLNESS (HPI):  72 y.o. female presented to [facility] ED yesterday for evaluation of worsening erythema associated with Left wrist and Right 4th finger abscesses 2 days following in-office incision and drainage ([place], 10/19/2017). Patient reports she was started on doxycycline by her primary care physician 2 days prior to incision and drainage and continued doxycycline until she presented to [facility] ED 2 days later. She described at that time concern regarding increased swelling and redness surrounding both sites as well as a linear red streak from her Left wrist up her arm. She otherwise denies fever/chills, additional drainage from the wounds, N/V, CP, or SOB. She says she has been washing the areas and has been applying bacitracin ointment and BandAid's to the wounds. Since admission and treatment with vancomycin, patient describes her the redness and swelling have both improved significantly.  Surgery is consulted by medical physician Dr. Anselm Jungling in this context for evaluation and management of improving Right 4th finger and Left wrist erythema s/p incision and drainage of small abscesses.  PAST MEDICAL HISTORY (PMH):  Past Medical History:  Diagnosis Date  . Anxiety   . Arthritis    hands  . Hypercholesteremia   . ITP (idiopathic thrombocytopenic purpura)   . Malignant neoplasm of upper-outer quadrant of left breast in female, estrogen receptor positive ([place]) 01/10/2017  . Neuropathy    bilateral feet  . Osteoporosis   . Vertigo    several episodes per year     PAST SURGICAL HISTORY [facility]):  Past Surgical History:  Procedure Laterality Date  . BREAST BIOPSY Left 12/28/2016   stereo path pend  . BREAST LUMPECTOMY    . COLONOSCOPY WITH PROPOFOL N/A 09/08/2017   Procedure: COLONOSCOPY WITH PROPOFOL;  Surgeon: Lucilla Lame, MD;  Location: [place];  Service: Endoscopy;  Laterality: N/A;  . POLYPECTOMY  09/08/2017    Procedure: POLYPECTOMY;  Surgeon: Lucilla Lame, MD;  Location: [place];  Service: Endoscopy;;  . SPLENECTOMY, TOTAL       MEDICATIONS:  Prior to Admission medications   Medication Sig Start Date End Date Taking? Authorizing Provider  ALPRAZolam (XANAX) 0.25 MG tablet TAKE (1) TABLET BY MOUTH EVERY DAY AS NEEDED 01/06/17  Yes Juline Patch, MD  Calcium Citrate-Vitamin D (CALCIUM + D PO) Take 1 tablet by mouth 2 (two) times daily.   Yes [provider]  gabapentin (NEURONTIN) 300 MG capsule TAKE (1) CAPSULE BY MOUTH TWICE DAILY 04/11/17  Yes Juline Patch, MD  Inulin (FIBER CHOICE) 1.5 g CHEW Chew 1 tablet by mouth daily.   Yes [provider]  Magnesium 100 MG CAPS Take 2 capsules by mouth daily.    Yes [provider]  meloxicam (MOBIC) 15 MG tablet Take 1 tablet (15 mg total) by mouth daily. 10/17/17  Yes Juline Patch, MD  mupirocin ointment (BACTROBAN) 2 % Apply 1 application topically 2 (two) times daily. 10/17/17  Yes Juline Patch, MD  oxybutynin (DITROPAN-XL) 10 MG 24 hr tablet Take 1 tablet (10 mg total) by mouth at bedtime. 08/28/17  Yes Juline Patch, MD  simvastatin (ZOCOR) 20 MG tablet TAKE ONE TABLET AT BEDTIME. 07/14/17  Yes Juline Patch, MD  clindamycin (CLEOCIN) 300 MG capsule Take 1 capsule (300 mg total) by mouth 3 (three) times daily for 7 days. 10/22/17 10/29/17  Vaughan Basta, MD  lactobacillus acidophilus & bulgar (LACTINEX) chewable tablet Chew 1 tablet by mouth 3 (three)  times daily with meals for 10 days. 10/22/17 11/01/17  Vaughan Basta, MD     ALLERGIES:  Allergies  Allergen Reactions  . Macrobid [Nitrofurantoin]   . Sulfa Antibiotics Other (See Comments)    Platelets drop      SOCIAL HISTORY:  Social History   Socioeconomic History  . Marital status: Widowed    Spouse name: Not on file  . Number of children: 3  . Years of education: Not on file  . Highest education level: 12th grade   Occupational History  . Occupation: Retired  Scientific laboratory technician  . Financial resource strain: Not hard at all  . Food insecurity:    Worry: Never true    Inability: Never true  . Transportation needs:    Medical: No    Non-medical: No  Tobacco Use  . Smoking status: Never Smoker  . Smokeless tobacco: Never Used  . Tobacco comment: smoking cessation materials not required  Substance and Sexual Activity  . Alcohol use: No    Alcohol/week: 0.0 oz  . Drug use: No  . Sexual activity: Not Currently  Lifestyle  . Physical activity:    Days per week: 2 days    Minutes per session: 60 min  . Stress: Not at all  Relationships  . Social connections:    Talks on phone: Patient refused    Gets together: Patient refused    Attends religious service: Patient refused    Active member of club or organization: Patient refused    Attends meetings of clubs or organizations: Patient refused    Relationship status: Widowed  . Intimate partner violence:    Fear of current or ex partner: No    Emotionally abused: No    Physically abused: No    Forced sexual activity: No  Other Topics Concern  . Not on file  Social History Narrative  . Not on file    The patient currently resides (home / rehab facility / nursing home): Home The patient normally is (ambulatory / bedbound): Ambulatory   FAMILY HISTORY:  Family History  Problem Relation Age of Onset  . Breast cancer Neg Hx      REVIEW OF SYSTEMS:  Constitutional: denies weight loss, fever, chills, or sweats  Eyes: denies any other vision changes, history of eye injury  ENT: denies sore throat, hearing problems  Respiratory: denies shortness of breath, wheezing  Cardiovascular: denies chest pain, palpitations  Gastrointestinal: denies abdominal pain, N/V, or diarrhea Genitourinary: denies burning with urination or urinary frequency Musculoskeletal: denies any other joint pains or cramps  Skin: denies any other rashes or skin discolorations  except as per HPI Neurological: denies any other headache, dizziness, weakness  Psychiatric: denies any other depression, anxiety   All other review of systems were negative   VITAL SIGNS:  Temp:  [97.7 F (36.5 C)-98.3 F (36.8 C)] 98 F (36.7 C) (05/26 0607) Pulse Rate:  [59-94] 59 (05/26 0607) Resp:  [16-20] 20 (05/26 0607) BP: (116-136)/(50-80) 116/50 (05/26 0607) SpO2:  [97 %-99 %] 99 % (05/26 0607) Weight:  [153 lb (69.4 kg)] 153 lb (69.4 kg) (05/25 1800)     Height: 5\' 6"  (167.6 cm) Weight: 153 lb (69.4 kg) BMI (Calculated): 24.71   INTAKE/OUTPUT:  This shift: Total I/O In: 240 [P.O.:240] Out: -   Last 2 shifts: @IOLAST2SHIFTS @   PHYSICAL EXAM:  Constitutional:  -- Normal body habitus  -- Awake, alert, and oriented x3, no apparent distress Eyes:  -- Pupils equally round and  reactive to light  -- No scleral icterus, B/L no occular discharge Ear, nose, throat: -- Neck is FROM WNL -- No jugular venous distension  Pulmonary:  -- No wheezes or rhales -- Equal breath sounds bilaterally -- Breathing non-labored at rest Cardiovascular:  -- S1, S2 present  -- No pericardial rubs  Gastrointestinal:  -- Abdomen soft, nontender, non-distended, no guarding or rebound tenderness -- No abdominal masses appreciated, pulsatile or otherwise  Musculoskeletal and Integumentary:  -- Wounds or skin discoloration: small minimally tender Right 4th finger wound with minimal surrounding erythema, small minimally tender Left wrist wound with <1 cm adjacent erythema and faint linear pink streak along dorsal-medial forearm and distal arm (that patient, her family, and medical physician describe as much improved) without hardened cord or purulence to suggest suppurative superficial thrombophlebitis, no fluctuance or purulent drainage from or surrounding either site appreciated -- Extremities: B/L UE and LE FROM, hands and feet warm, no edema  Neurologic:  -- Motor function: Intact and  symmetric -- Sensation: Intact and symmetric Psychiatric:  -- Mood and affect WNL  Labs:  CBC Latest Ref Rng & Units 10/22/2017 10/21/2017 10/31/2016  WBC 3.6 - 11.0 K/uL 5.1 6.7 5.4  Hemoglobin 12.0 - 16.0 g/dL 12.8 13.2 13.3  Hematocrit 35.0 - 47.0 % 37.5 37.8 41.1  Platelets 150 - 440 K/uL 223 252 248   CMP Latest Ref Rng & Units 10/22/2017 10/21/2017 10/31/2016  Glucose 65 - 99 mg/dL 96 138(H) 90  BUN 6 - 20 mg/dL 14 18 15   Creatinine 0.44 - 1.00 mg/dL 0.66 0.80 0.78  Sodium 135 - 145 mmol/L 140 138 142  Potassium 3.5 - 5.1 mmol/L 3.8 4.4 4.5  Chloride 101 - 111 mmol/L 109 102 104  CO2 22 - 32 mmol/L 27 30 26   Calcium 8.9 - 10.3 mg/dL 8.8(L) 9.1 9.2  Total Protein 6.5 - 8.1 g/dL 6.2(L) - -  Total Bilirubin 0.3 - 1.2 mg/dL 0.7 - 0.5  Alkaline Phos 38 - 126 U/L 67 - 53  AST 15 - 41 U/L 26 - 20  ALT 14 - 54 U/L 21 - 14   Imaging studies: No new pertinent imaging studies available for review  Assessment/Plan: (ICD-10's: L02.511, L02.414) 72 y.o. female with improved Right 4th finger and Left wrist erythema 3 days s/p incision and drainage of small abscesses at these two sites and changing of patient's antibiotics, complicated by pertinent comorbidities including hypercholesterolemia, ITP (platelets currently 223).   - antibiotics as per medical team (clindamycin advised by pharmacist)  - keep the affected sites clean and dry, may shower with soap and water  - no indication at this time for any additional surgical intervention or management  - anti-inflammatory meds prn (acetaminophen since patient advised to avoid NSAID's with ITP)  - agree with completion of outpatient oral antibiotics and outpatient surgical follow-up  - patient advised to call surgical office if questions or return to ED if worsens  All of the above findings and recommendations were discussed with the patient, her family, and medical physician, and all of patient's and her family's questions were answered to their  expressed satisfaction.  Thank you for the opportunity to participate in this patient's care.   -- Marilynne Drivers Rosana Hoes, MD, [place]: [facility] - Partnering for exceptional care. Office: [phone]

## 2017-10-24 ENCOUNTER — Telehealth: Payer: Self-pay

## 2017-10-24 NOTE — Telephone Encounter (Signed)
I have made the 2nd attempt to contact the patient or family member in charge, in order to follow up from recently being discharged from the hospital. I left a message on voicemail to return call once available.

## 2017-10-24 NOTE — Telephone Encounter (Signed)
I have made the 1st attempt to contact the patient or family member in charge, in order to follow up from recently being discharged from the hospital. I left a message on voicemail but I will make another attempt at a different time.  

## 2017-10-25 ENCOUNTER — Telehealth: Payer: Self-pay | Admitting: Surgery
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
## 2017-10-25 NOTE — Telephone Encounter (Signed)
Patient left voicemail with question about culture results. Please advise

## 2017-10-26 ENCOUNTER — Telehealth: Payer: Self-pay

## 2017-10-26 ENCOUNTER — Other Ambulatory Visit: Payer: Self-pay

## 2017-10-26 LAB — CULTURE, BLOOD (ROUTINE X 2)
CULTURE: NO GROWTH
Culture: NO GROWTH
SPECIAL REQUESTS: ADEQUATE
Special Requests: ADEQUATE

## 2017-10-26 NOTE — Progress Notes (Unsigned)
Telephone message

## 2017-10-26 NOTE — Telephone Encounter (Signed)
Pt called wanting results that were performed at the hospital. The hospital told her to call here. We did not run a culture of the nose, I don't see anything in chart. I advised pt of the only other thing I knew to do, call the hospital lab

## 2017-10-29 NOTE — Discharge Summary (Signed)
[facility] [place] NAME: Melissa Daniels    MR#:  [id number]  DATE OF BIRTH:  [birth year]  DATE OF ADMISSION:  10/21/2017 ADMITTING PHYSICIAN: Idelle Crouch, MD  DATE OF DISCHARGE: 10/22/2017  1:32 PM  PRIMARY CARE PHYSICIAN: Juline Patch, MD    ADMISSION DIAGNOSIS:  Cellulitis of left upper extremity [L03.114]  DISCHARGE DIAGNOSIS:  Principal Problem:   Cellulitis Active Problems:   Familial multiple lipoprotein-type hyperlipidemia   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[place])   Asplenia   SECONDARY DIAGNOSIS:   Past Medical History:  Diagnosis Date  . Anxiety   . Arthritis    hands  . Hypercholesteremia   . ITP (idiopathic thrombocytopenic purpura)   . Malignant neoplasm of upper-outer quadrant of left breast in female, estrogen receptor positive ([place]) 01/10/2017  . Neuropathy    bilateral feet  . Osteoporosis   . Vertigo    several episodes per year    HOSPITAL COURSE:   Pt's cellulitis was much better the next day. She and her daughter insisted to see a Psychologist, sport and exercise as she had I & D done last week. I called surgical consult. Dr. Rosana Hoes agreed on switch to oral Abx and discharge.  DISCHARGE CONDITIONS:   Stable.  CONSULTS OBTAINED:  Treatment Team:  Vickie Epley, MD  DRUG ALLERGIES:   Allergies  Allergen Reactions  . Macrobid [Nitrofurantoin]   . Sulfa Antibiotics Other (See Comments)    Platelets drop     DISCHARGE MEDICATIONS:   Allergies as of 10/22/2017      Reactions   Macrobid [nitrofurantoin]    Sulfa Antibiotics Other (See Comments)   Platelets drop       Medication List    STOP taking these medications   doxycycline 100 MG tablet Commonly known as:  VIBRA-TABS     TAKE these medications   ALPRAZolam 0.25 MG tablet Commonly known as:  XANAX TAKE (1) TABLET BY MOUTH EVERY DAY AS NEEDED   CALCIUM + D PO Take 1 tablet by mouth 2 (two) times daily.   clindamycin 300 MG  capsule Commonly known as:  CLEOCIN Take 1 capsule (300 mg total) by mouth 3 (three) times daily for 7 days.   FIBER CHOICE 1.5 g Chew Generic drug:  Inulin Chew 1 tablet by mouth daily.   gabapentin 300 MG capsule Commonly known as:  NEURONTIN TAKE (1) CAPSULE BY MOUTH TWICE DAILY   lactobacillus acidophilus & bulgar chewable tablet Chew 1 tablet by mouth 3 (three) times daily with meals for 10 days.   Magnesium 100 MG Caps Take 2 capsules by mouth daily.   meloxicam 15 MG tablet Commonly known as:  MOBIC Take 1 tablet (15 mg total) by mouth daily.   mupirocin ointment 2 % Commonly known as:  BACTROBAN Apply 1 application topically 2 (two) times daily.   oxybutynin 10 MG 24 hr tablet Commonly known as:  DITROPAN-XL Take 1 tablet (10 mg total) by mouth at bedtime.   simvastatin 20 MG tablet Commonly known as:  ZOCOR TAKE ONE TABLET AT BEDTIME.        DISCHARGE INSTRUCTIONS:    Follow with PMD in 1-2 weeks.  If you experience worsening of your admission symptoms, develop shortness of breath, life threatening emergency, suicidal or homicidal thoughts you must seek medical attention immediately by calling 911 or calling your MD immediately  if symptoms less severe.  You Must read complete instructions/literature along with all the possible  adverse reactions/side effects for all the Medicines you take and that have been prescribed to you. Take any new Medicines after you have completely understood and accept all the possible adverse reactions/side effects.   Please note  You were cared for by a hospitalist during your hospital stay. If you have any questions about your discharge medications or the care you received while you were in the hospital after you are discharged, you can call the unit and asked to speak with the hospitalist on call if the hospitalist that took care of you is not available. Once you are discharged, your primary care physician will handle any  further medical issues. Please note that NO REFILLS for any discharge medications will be authorized once you are discharged, as it is imperative that you return to your primary care physician (or establish a relationship with a primary care physician if you do not have one) for your aftercare needs so that they can reassess your need for medications and monitor your lab values.    Today   CHIEF COMPLAINT:   Chief Complaint  Patient presents with  . Cellulitis    HISTORY OF PRESENT ILLNESS:  Melissa Daniels  is a 72 y.o. female with a known history of breast cancer, ITP, and asplenia now with progressive redness and warmth of left hand and arm despite po Doxycycline. Had an abscess drained earlier in the week and was started on Doxycycline 5 days ago. Now with progressive redness with warmth and pain. No fever. Denies N/V/D. Denies CP or SOB. She is now admitted   VITAL SIGNS:  Blood pressure (!) 116/50, pulse (!) 59, temperature 98 F (36.7 C), temperature source Oral, resp. rate 20, height 5\' 6"  (1.676 m), weight 69.4 kg (153 lb), SpO2 99 %.  I/O:  No intake or output data in the 24 hours ending 10/29/17 2219  PHYSICAL EXAMINATION:    General:  No apparent distress, WDWN, [place]/AT  Eyes: PERRL, EOMI, no scleral icterus, conjunctiva clear  ENT: moist oropharynx without exudate, TM's benign, dentition good  Neck: supple, no lymphadenopathy. No bruits or thyromegaly  Cardiovascular: regular rate without MRG; 2+ peripheral pulses, no JVD, no peripheral edema  Respiratory: CTA biL, good air movement without wheezing, rhonchi or crackled. Respiratory effort normal  Abdomen: soft, non tender to palpation, positive bowel sounds, no guarding, no rebound  Skin: pustule noted to right ring finger and left hand and arm to biceps has erythema, warmth and tenderness. Wound below left thumb noted.  Musculoskeletal: normal bulk and tone, no joint swelling  Psychiatric: normal mood and affect,  A&OX3  Neurologic: CN 2-12 grossly intact, Motor strength 5/5 in all 4 groups with symmetric DTR's and non-focal sensory exam     DATA REVIEW:   CBC No results for input(s): WBC, HGB, HCT, PLT in the last 168 hours.  Chemistries  No results for input(s): NA, K, CL, CO2, GLUCOSE, BUN, CREATININE, CALCIUM, MG, AST, ALT, ALKPHOS, BILITOT in the last 168 hours.  Invalid input(s): GFRCGP  Cardiac Enzymes No results for input(s): TROPONINI in the last 168 hours.  Microbiology Results  Results for orders placed or performed during the hospital encounter of 10/21/17  Blood culture (routine x 2)     Status: None   Collection Time: 10/21/17  4:18 PM  Result Value Ref Range Status   Specimen Description BLOOD R AC  Final   Special Requests   Final    BOTTLES DRAWN AEROBIC AND ANAEROBIC Blood Culture adequate volume  Culture   Final    NO GROWTH 5 DAYS Performed at [facility], [address] [place], [place] [address]    Report Status 10/26/2017 FINAL  Final  Blood culture (routine x 2)     Status: None   Collection Time: 10/21/17  4:18 PM  Result Value Ref Range Status   Specimen Description BLOOD R FOREARM  Final   Special Requests   Final    BOTTLES DRAWN AEROBIC AND ANAEROBIC Blood Culture adequate volume   Culture   Final    NO GROWTH 5 DAYS Performed at [facility], [address] [place], [place] [address]    Report Status 10/26/2017 FINAL  Final    RADIOLOGY:  No results found.  EKG:  No orders found for this or any previous visit.    Management plans discussed with the patient, family and they are in agreement.  CODE STATUS:  Code Status History    Date Active Date Inactive Code Status Order ID Comments User Context   10/21/2017 1808 10/22/2017 1637 Full Code [id number]  Idelle Crouch, MD Inpatient    Advance Directive Documentation     Most Recent Value  Type of Advance Directive  Healthcare Power of [place], Living will   Pre-existing out of facility DNR order (yellow form or pink MOST form)  -  "MOST" Form in Place?  -      TOTAL TIME TAKING CARE OF THIS PATIENT: 35 minutes.    Vaughan Basta M.D on 10/29/2017 at 10:19 PM  Between 7am to 6pm - Pager - [phone]  After 6pm go to [email or web address] - password [email or web address] [email or web address]  Office  [phone]  CC: Primary care physician; Juline Patch, MD   Note: This dictation was prepared with Dragon dictation along with smaller phrase technology. Any transcriptional errors that result from this process are unintentional.

## 2017-10-30 DIAGNOSIS — L02414 Cutaneous abscess of left upper limb: Secondary | ICD-10-CM

## 2017-10-30 DIAGNOSIS — L02511 Cutaneous abscess of right hand: Secondary | ICD-10-CM

## 2017-10-31 ENCOUNTER — Encounter: Payer: Self-pay | Admitting: Surgery

## 2017-10-31 ENCOUNTER — Ambulatory Visit (INDEPENDENT_AMBULATORY_CARE_PROVIDER_SITE_OTHER): Payer: Medicare Other | Admitting: Surgery

## 2017-10-31 VITALS — BP 119/65 | HR 59 | Temp 97.9°F | Ht 66.0 in | Wt 152.0 lb

## 2017-10-31 DIAGNOSIS — L02511 Cutaneous abscess of right hand: Secondary | ICD-10-CM | POA: Diagnosis not present

## 2017-10-31 NOTE — Patient Instructions (Signed)
You may continue applying the cream to the affected areas.   Please call our office if you have questions or concerns.

## 2017-10-31 NOTE — Progress Notes (Signed)
Outpatient Surgical Follow Up  10/31/2017  Melissa Daniels is an 72 y.o. female.   Chief Complaint  Patient presents with  . Follow-up    Abscess on ring finger    HPI: s/p I/D right finger and left wrist. Cultures did not grow anything. She to the emergency room and actually stay overnight for IV antibiotics including vancomycin.  Blood Cultures   as well as wound cultures were all negative.  That time she also had cellulitis extending to the left arm which has resolved Just completed a short course of clindamycin with good response.  She denies any more pain and the wound has completely healed.  Past Medical History:  Diagnosis Date  . Anxiety   . Arthritis    hands  . Hypercholesteremia   . ITP (idiopathic thrombocytopenic purpura)   . Malignant neoplasm of upper-outer quadrant of left breast in female, estrogen receptor positive ([place]) 01/10/2017  . Neuropathy    bilateral feet  . Osteoporosis   . Vertigo    several episodes per year    Past Surgical History:  Procedure Laterality Date  . BREAST BIOPSY Left 12/28/2016   stereo path pend  . BREAST LUMPECTOMY    . COLONOSCOPY WITH PROPOFOL N/A 09/08/2017   Procedure: COLONOSCOPY WITH PROPOFOL;  Surgeon: Lucilla Lame, MD;  Location: [facility];  Service: Endoscopy;  Laterality: N/A;  . POLYPECTOMY  09/08/2017   Procedure: POLYPECTOMY;  Surgeon: Lucilla Lame, MD;  Location: [facility];  Service: Endoscopy;;  . SPLENECTOMY, TOTAL      Family History  Problem Relation Age of Onset  . Breast cancer Neg Hx     Social History:  reports that she has never smoked. She has never used smokeless tobacco. She reports that she does not drink alcohol or use drugs.  Allergies:  Allergies  Allergen Reactions  . Macrobid [Nitrofurantoin]   . Sulfa Antibiotics Other (See Comments)    Platelets drop     Medications reviewed.    ROS Full ROS performed and is otherwise negative other than what is stated in HPI   BP  119/65   Pulse (!) 59   Temp 97.9 F (36.6 C) (Oral)   Ht 5\' 6"  (1.676 m)   Wt 68.9 kg (152 lb)   BMI 24.53 kg/m   Physical Exam NAD, awake, alert Skin: Left wrist and right finger healed, no evidence of wound or cellulitis. No abscess. No motor or sensory deficits on hands, no compartment sx Psy: Competent. Intact judgement   Assessment/Plan: Resolving cellulitis and abscess likely from MRSA.  At this point no evidence of an active infection. No Further antibiotic or surgical therapy required. RTC prn  Greater than 50% of the15 minutes  visit was spent in counseling/coordination of care   Caroleen Hamman, MD [facility]

## 2017-11-01 ENCOUNTER — Ambulatory Visit: Payer: Self-pay | Admitting: Surgery

## 2017-11-06 ENCOUNTER — Ambulatory Visit (INDEPENDENT_AMBULATORY_CARE_PROVIDER_SITE_OTHER): Payer: Medicare Other | Admitting: Family Medicine

## 2017-11-06 DIAGNOSIS — E785 Hyperlipidemia, unspecified: Secondary | ICD-10-CM

## 2017-11-06 NOTE — Progress Notes (Signed)
Ordered labs

## 2017-11-07 LAB — LIPID PANEL WITH LDL/HDL RATIO
CHOLESTEROL TOTAL: 195 mg/dL (ref 100–199)
HDL: 54 mg/dL (ref >39)
LDL Calculated: 115 mg/dL — ABNORMAL HIGH (ref 0–99)
LDl/HDL Ratio: 2.1 ratio (ref 0.0–3.2)
TRIGLYCERIDES: 130 mg/dL (ref 0–149)
VLDL CHOLESTEROL CAL: 26 mg/dL (ref 5–40)

## 2017-11-16 ENCOUNTER — Ambulatory Visit (INDEPENDENT_AMBULATORY_CARE_PROVIDER_SITE_OTHER): Payer: Medicare Other | Admitting: Family Medicine

## 2017-11-16 ENCOUNTER — Other Ambulatory Visit: Payer: Self-pay | Admitting: Family Medicine

## 2017-11-16 ENCOUNTER — Encounter: Payer: Self-pay | Admitting: Family Medicine

## 2017-11-16 VITALS — BP 120/80 | HR 68 | Ht 66.0 in | Wt 149.0 lb

## 2017-11-16 DIAGNOSIS — G629 Polyneuropathy, unspecified: Secondary | ICD-10-CM

## 2017-11-16 DIAGNOSIS — M25561 Pain in right knee: Secondary | ICD-10-CM | POA: Diagnosis not present

## 2017-11-16 DIAGNOSIS — E7849 Other hyperlipidemia: Secondary | ICD-10-CM | POA: Diagnosis not present

## 2017-11-16 DIAGNOSIS — N393 Stress incontinence (female) (male): Secondary | ICD-10-CM | POA: Diagnosis not present

## 2017-11-16 DIAGNOSIS — M25562 Pain in left knee: Secondary | ICD-10-CM

## 2017-11-16 DIAGNOSIS — M7541 Impingement syndrome of right shoulder: Secondary | ICD-10-CM | POA: Diagnosis not present

## 2017-11-16 DIAGNOSIS — Z0001 Encounter for general adult medical examination with abnormal findings: Secondary | ICD-10-CM | POA: Diagnosis not present

## 2017-11-16 DIAGNOSIS — F410 Panic disorder [episodic paroxysmal anxiety] without agoraphobia: Secondary | ICD-10-CM

## 2017-11-16 DIAGNOSIS — Z0000 Encounter for general adult medical examination without abnormal findings: Secondary | ICD-10-CM

## 2017-11-16 MED ORDER — GABAPENTIN 300 MG PO CAPS: ORAL_CAPSULE | ORAL | 11 refills | Status: DC

## 2017-11-16 MED ORDER — OXYBUTYNIN CHLORIDE ER 10 MG PO TB24: 10.0000 mg | ORAL_TABLET | Freq: Every day | ORAL | 3 refills | Status: DC

## 2017-11-16 MED ORDER — ALPRAZOLAM 0.25 MG PO TABS: ORAL_TABLET | ORAL | 0 refills | Status: DC

## 2017-11-16 MED ORDER — SIMVASTATIN 20 MG PO TABS: ORAL_TABLET | ORAL | 1 refills | Status: DC

## 2017-11-16 NOTE — Progress Notes (Signed)
Name: Melissa Daniels   MRN: [id number]    DOB: 05/15/1946   Date:11/16/2017       Progress Note  Subjective  Chief Complaint  Chief Complaint  Patient presents with  . Annual Exam    Patient is a 72 year old female who presents for a comprehensive physical exam. The patient reports the following problems: bilateral knee pain and right shoulder pain.  Health maintenance has been reviewed and all up to date.   Episodic paroxysmal anxiety disorder Stable with ocaissional alprazolam 0.25 mg   Familial multiple lipoprotein-type hyperlipidemia Controlled with diet and simvastatin 20 mg daily. Will check lipid panel.  Female stress incontinence Symptom relief with oxybutynin 10 mg at hs.  Neuropathy Followed by neurology. Will refill gabapentin 300 mg bid.   Past Medical History:  Diagnosis Date  . Anxiety   . Arthritis    hands  . Hypercholesteremia   . ITP (idiopathic thrombocytopenic purpura)   . Malignant neoplasm of upper-outer quadrant of left breast in female, estrogen receptor positive ([place]) 01/10/2017  . Neuropathy    bilateral feet  . Osteoporosis   . Vertigo    several episodes per year    Past Surgical History:  Procedure Laterality Date  . BREAST BIOPSY Left 12/28/2016   stereo path pend  . BREAST LUMPECTOMY    . COLONOSCOPY WITH PROPOFOL N/A 09/08/2017   Procedure: COLONOSCOPY WITH PROPOFOL;  Surgeon: Lucilla Lame, MD;  Location: [facility];  Service: Endoscopy;  Laterality: N/A;  . POLYPECTOMY  09/08/2017   Procedure: POLYPECTOMY;  Surgeon: Lucilla Lame, MD;  Location: [facility];  Service: Endoscopy;;  . SPLENECTOMY, TOTAL      Family History  Problem Relation Age of Onset  . Breast cancer Neg Hx     Social History   Socioeconomic History  . Marital status: Widowed    Spouse name: Not on file  . Number of children: 3  . Years of education: Not on file  . Highest education level: 12th grade  Occupational History  . Occupation:  Retired  Scientific laboratory technician  . Financial resource strain: Not hard at all  . Food insecurity:    Worry: Never true    Inability: Never true  . Transportation needs:    Medical: No    Non-medical: No  Tobacco Use  . Smoking status: Never Smoker  . Smokeless tobacco: Never Used  . Tobacco comment: smoking cessation materials not required  Substance and Sexual Activity  . Alcohol use: No    Alcohol/week: 0.0 oz  . Drug use: No  . Sexual activity: Not Currently  Lifestyle  . Physical activity:    Days per week: 2 days    Minutes per session: 60 min  . Stress: Not at all  Relationships  . Social connections:    Talks on phone: Patient refused    Gets together: Patient refused    Attends religious service: Patient refused    Active member of club or organization: Patient refused    Attends meetings of clubs or organizations: Patient refused    Relationship status: Widowed  . Intimate partner violence:    Fear of current or ex partner: No    Emotionally abused: No    Physically abused: No    Forced sexual activity: No  Other Topics Concern  . Not on file  Social History Narrative  . Not on file    Allergies  Allergen Reactions  . Macrobid [Nitrofurantoin]   . Sulfa  Antibiotics Other (See Comments)    Platelets drop     Outpatient Medications Prior to Visit  Medication Sig Dispense Refill  . Calcium Citrate-Vitamin D (CALCIUM + D PO) Take 1 tablet by mouth 2 (two) times daily.    . Inulin (FIBER CHOICE) 1.5 g CHEW Chew 1 tablet by mouth daily.    . Magnesium 100 MG CAPS Take 2 capsules by mouth daily.     . meloxicam (MOBIC) 15 MG tablet Take 1 tablet (15 mg total) by mouth daily. 30 tablet 0  . ALPRAZolam (XANAX) 0.25 MG tablet TAKE (1) TABLET BY MOUTH EVERY DAY AS NEEDED 30 tablet 0  . gabapentin (NEURONTIN) 300 MG capsule TAKE (1) CAPSULE BY MOUTH TWICE DAILY 60 capsule 1  . oxybutynin (DITROPAN-XL) 10 MG 24 hr tablet Take 1 tablet (10 mg total) by mouth at bedtime. 90  tablet 0  . simvastatin (ZOCOR) 20 MG tablet TAKE ONE TABLET AT BEDTIME. 30 tablet 0  . mupirocin ointment (BACTROBAN) 2 % Apply 1 application topically 2 (two) times daily. 22 g 0   No facility-administered medications prior to visit.     Review of Systems  Constitutional: Negative for chills, fever, malaise/fatigue and weight loss.  HENT: Negative for ear discharge, ear pain and sore throat.   Eyes: Negative for blurred vision.  Respiratory: Negative for cough, sputum production, shortness of breath and wheezing.   Cardiovascular: Negative for chest pain, palpitations and leg swelling.  Gastrointestinal: Negative for abdominal pain, blood in stool, constipation, diarrhea, heartburn, melena and nausea.  Genitourinary: Negative for dysuria, frequency, hematuria and urgency.  Musculoskeletal: Negative for back pain, joint pain, myalgias and neck pain.  Skin: Negative for rash.  Neurological: Negative for dizziness, tingling, sensory change, focal weakness and headaches.  Endo/Heme/Allergies: Negative for environmental allergies and polydipsia. Does not bruise/bleed easily.  Psychiatric/Behavioral: Negative for depression and suicidal ideas. The patient is not nervous/anxious and does not have insomnia.      Objective  Vitals:   11/16/17 0835  BP: 120/80  Pulse: 68  Weight: 149 lb (67.6 kg)  Height: 5\' 6"  (1.676 m)    Physical Exam  Constitutional: She is oriented to person, place, and time. Vital signs are normal. She appears well-developed and well-nourished.  HENT:  Head: Normocephalic and atraumatic.  Right Ear: Hearing, tympanic membrane, external ear and ear canal normal.  Left Ear: Hearing, tympanic membrane, external ear and ear canal normal.  Nose: Nose normal.  Mouth/Throat: Uvula is midline and oropharynx is clear and moist.  Eyes: Pupils are equal, round, and reactive to light. Conjunctivae, EOM and lids are normal. Lids are everted and swept, no foreign bodies  found. Left eye exhibits no hordeolum. No foreign body present in the left eye. Right conjunctiva is not injected. Left conjunctiva is not injected. No scleral icterus.  Fundoscopic exam:      The right eye shows no arteriolar narrowing and no AV nicking.       The left eye shows no arteriolar narrowing and no AV nicking.  Neck: Normal range of motion. Neck supple. Normal carotid pulses, no hepatojugular reflux and no JVD present. Carotid bruit is not present. No tracheal deviation present. No thyromegaly present.  Cardiovascular: Normal rate, regular rhythm, S1 normal, S2 normal, normal heart sounds and intact distal pulses. Exam reveals no gallop, no S3, no S4 and no friction rub.  No murmur heard.  No systolic murmur is present.  No diastolic murmur is present. Pulses:  Carotid pulses are 2+ on the right side, and 2+ on the left side.      Radial pulses are 2+ on the right side, and 2+ on the left side.       Femoral pulses are 2+ on the right side, and 2+ on the left side.      Popliteal pulses are 2+ on the right side, and 2+ on the left side.       Dorsalis pedis pulses are 2+ on the right side, and 2+ on the left side.       Posterior tibial pulses are 2+ on the right side, and 2+ on the left side.  Pulmonary/Chest: Effort normal and breath sounds normal. No respiratory distress. She has no wheezes. She has no rales. Right breast exhibits no inverted nipple, no mass, no nipple discharge, no skin change and no tenderness. Left breast exhibits no inverted nipple, no mass, no nipple discharge, no skin change and no tenderness. No breast swelling, tenderness, discharge or bleeding. Breasts are symmetrical.  Abdominal: Soft. Normal aorta and bowel sounds are normal. She exhibits no mass. There is no hepatosplenomegaly. There is no tenderness. There is no rebound and no guarding.  Musculoskeletal: Normal range of motion. She exhibits no edema.       Right shoulder: She exhibits tenderness.        Right knee: Tenderness found. Medial joint line and lateral joint line tenderness noted.       Left knee: Tenderness found. Medial joint line and lateral joint line tenderness noted.  Lymphadenopathy:       Head (right side): No submental and no submandibular adenopathy present.       Head (left side): No submental and no submandibular adenopathy present.    She has no cervical adenopathy.    She has no axillary adenopathy.  Neurological: She is alert and oriented to person, place, and time. She has normal strength. She displays normal reflexes. No cranial nerve deficit or sensory deficit.  Reflex Scores:      Tricep reflexes are 2+ on the right side and 2+ on the left side.      Bicep reflexes are 2+ on the right side and 2+ on the left side.      Brachioradialis reflexes are 2+ on the right side and 2+ on the left side.      Patellar reflexes are 2+ on the right side and 2+ on the left side.      Achilles reflexes are 2+ on the right side and 2+ on the left side. Skin: Skin is warm. No rash noted.  Psychiatric: She has a normal mood and affect. Her mood appears not anxious. She does not exhibit a depressed mood.  Nursing note and vitals reviewed.     Assessment & Plan  Problem List Items Addressed This Visit      Nervous and Auditory   Neuropathy    Followed by neurology. Will refill gabapentin 300 mg bid.      Relevant Medications   gabapentin (NEURONTIN) 300 MG capsule     Other   Familial multiple lipoprotein-type hyperlipidemia    Controlled with diet and simvastatin 20 mg daily. Will check lipid panel.      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      Relevant Medications   oxybutynin (DITROPAN-XL) 10 MG 24 hr tablet  Episodic paroxysmal anxiety disorder    Stable with ocaissional alprazolam 0.25 mg       Relevant Medications   ALPRAZolam (XANAX) 0.25 MG tablet     Other Visit Diagnoses    Annual physical exam    -  Primary   Physical exam normal. Subjective concerns examined and addressed   Arthralgia of both knees       Onset for couple of months, will refer to orthopedics   Relevant Orders   Ambulatory referral to Orthopedic Surgery   Impingement syndrome of right shoulder       Exam c/w impingment will refer to ortho for eval and treatment/? injection   Relevant Orders   Ambulatory referral to Orthopedic Surgery      Meds ordered this encounter  Medications  . ALPRAZolam (XANAX) 0.25 MG tablet    Sig: TAKE (1) TABLET BY MOUTH EVERY DAY AS NEEDED    Dispense:  30 tablet    Refill:  0  . simvastatin (ZOCOR) 20 MG tablet    Sig: TAKE ONE TABLET AT BEDTIME.    Dispense:  90 tablet    Refill:  1    Needs appt for meds  . oxybutynin (DITROPAN-XL) 10 MG 24 hr tablet    Sig: Take 1 tablet (10 mg total) by mouth at bedtime.    Dispense:  90 tablet    Refill:  3    Needs appt for meds  . gabapentin (NEURONTIN) 300 MG capsule    Sig: TAKE (1) CAPSULE BY MOUTH TWICE DAILY    Dispense:  60 capsule    Refill:  11      Dr. Lanell [facility]  11/16/17

## 2017-11-16 NOTE — Assessment & Plan Note (Signed)
Symptom relief with oxybutynin 10 mg at hs.

## 2017-11-16 NOTE — Assessment & Plan Note (Signed)
Followed by neurology. Will refill gabapentin 300 mg bid.

## 2017-11-16 NOTE — Patient Instructions (Signed)
Shoulder Impingement Syndrome Shoulder impingement syndrome is a condition that causes pain when connective tissues (tendons) surrounding the shoulder joint become pinched. These tendons are part of the group of muscles and tissues that help to stabilize the shoulder (rotator cuff). Beneath the rotator cuff is a fluid-filled sac (bursa) that allows the muscles and tendons to glide smoothly. The bursa may become swollen or irritated (bursitis). Bursitis, swelling in the rotator cuff tendons, or both conditions can decrease how much space is under a bone in the shoulder joint (acromion), resulting in impingement. What are the causes? Shoulder impingement syndrome can be caused by bursitis or swelling of the rotator cuff tendons, which may result from:  Repetitive overhead arm movements.  Falling onto the shoulder.  Weakness in the shoulder muscles.  What increases the risk? You may be more likely to develop this condition if you are an athlete who participates in:  Sports that involve throwing, such as baseball.  Tennis.  Swimming.  Volleyball.  Some people are also more likely to develop impingement syndrome because of the shape of their acromion bone. What are the signs or symptoms? The main symptom of this condition is pain on the front or side of the shoulder. Pain may:  Get worse when lifting or raising the arm.  Get worse at night.  Wake you up from sleeping.  Feel sharp when the shoulder is moved, and then fade to an ache.  Other signs and symptoms may include:  Tenderness.  Stiffness.  Inability to raise the arm above shoulder level or behind the body.  Weakness.  How is this diagnosed? This condition may be diagnosed based on:  Your symptoms.  Your medical history.  A physical exam.  Imaging tests, such as: ? X-rays. ? MRI. ? Ultrasound.  How is this treated? Treatment for this condition may include:  Resting your shoulder and avoiding all  activities that cause pain or put stress on the shoulder.  Icing your shoulder.  NSAIDs to help reduce pain and swelling.  One or more injections of medicines to numb the area and reduce inflammation.  Physical therapy.  Surgery. This may be needed if nonsurgical treatments have not helped. Surgery may involve repairing the rotator cuff, reshaping the acromion, or removing the bursa.  Follow these instructions at home: Managing pain, stiffness, and swelling  If directed, apply ice to the injured area. ? Put ice in a plastic bag. ? Place a towel between your skin and the bag. ? Leave the ice on for 20 minutes, 2-3 times a day. Activity  Rest and return to your normal activities as told by your health care provider. Ask your health care provider what activities are safe for you.  Do exercises as told by your health care provider. General instructions  Do not use any tobacco products, including cigarettes, chewing tobacco, or e-cigarettes. Tobacco can delay healing. If you need help quitting, ask your health care provider.  Ask your health care provider when it is safe for you to drive.  Take over-the-counter and prescription medicines only as told by your health care provider.  Keep all follow-up visits as told by your health care provider. This is important. How is this prevented?  Give your body time to rest between periods of activity.  Be safe and responsible while being active to avoid falls.  Maintain physical fitness, including strength and flexibility. Contact a health care provider if:  Your symptoms have not improved after 1-2 months of treatment and   rest.  You cannot lift your arm away from your body. This information is not intended to replace advice given to you by your health care provider. Make sure you discuss any questions you have with your health care provider. Document Released: 05/16/2005 Document Revised: 01/21/2016 Document Reviewed:  04/18/2015 Elsevier Interactive Patient Education  2018 [employer]  

## 2017-11-16 NOTE — Assessment & Plan Note (Signed)
Stable with ocaissional alprazolam 0.25 mg

## 2017-11-16 NOTE — Assessment & Plan Note (Signed)
Controlled with diet and simvastatin 20 mg daily. Will check lipid panel.

## 2017-11-20 DIAGNOSIS — Z6824 Body mass index (BMI) 24.0-24.9, adult: Secondary | ICD-10-CM | POA: Diagnosis not present

## 2017-11-20 DIAGNOSIS — Z170 Estrogen receptor positive status [ER+]: Secondary | ICD-10-CM | POA: Diagnosis not present

## 2017-11-20 DIAGNOSIS — C50412 Malignant neoplasm of upper-outer quadrant of left female breast: Secondary | ICD-10-CM | POA: Diagnosis not present

## 2017-11-22 ENCOUNTER — Other Ambulatory Visit: Payer: Self-pay

## 2017-11-22 DIAGNOSIS — G629 Polyneuropathy, unspecified: Secondary | ICD-10-CM

## 2017-11-22 MED ORDER — GABAPENTIN 300 MG PO CAPS: ORAL_CAPSULE | ORAL | 1 refills | Status: DC

## 2017-11-23 DIAGNOSIS — M25561 Pain in right knee: Secondary | ICD-10-CM | POA: Diagnosis not present

## 2017-11-23 DIAGNOSIS — M170 Bilateral primary osteoarthritis of knee: Secondary | ICD-10-CM | POA: Diagnosis not present

## 2017-11-23 DIAGNOSIS — G8929 Other chronic pain: Secondary | ICD-10-CM | POA: Diagnosis not present

## 2017-11-28 ENCOUNTER — Emergency Department
Admission: EM | Admit: 2017-11-28 | Discharge: 2017-11-28 | Disposition: A | Discharge status: Home / Self Care | Payer: Medicare Other | Attending: Emergency Medicine | Admitting: Emergency Medicine

## 2017-11-28 ENCOUNTER — Encounter: Payer: Self-pay | Admitting: Emergency Medicine

## 2017-11-28 ENCOUNTER — Other Ambulatory Visit: Payer: Self-pay

## 2017-11-28 DIAGNOSIS — S6992XA Unspecified injury of left wrist, hand and finger(s), initial encounter: Secondary | ICD-10-CM | POA: Diagnosis present

## 2017-11-28 DIAGNOSIS — Z79899 Other long term (current) drug therapy: Secondary | ICD-10-CM | POA: Diagnosis not present

## 2017-11-28 DIAGNOSIS — Y929 Unspecified place or not applicable: Secondary | ICD-10-CM | POA: Diagnosis not present

## 2017-11-28 DIAGNOSIS — Y9389 Activity, other specified: Secondary | ICD-10-CM | POA: Diagnosis not present

## 2017-11-28 DIAGNOSIS — S61213A Laceration without foreign body of left middle finger without damage to nail, initial encounter: Secondary | ICD-10-CM | POA: Diagnosis not present

## 2017-11-28 DIAGNOSIS — Z853 Personal history of malignant neoplasm of breast: Secondary | ICD-10-CM | POA: Insufficient documentation

## 2017-11-28 DIAGNOSIS — S56429A Laceration of extensor muscle, fascia and tendon of unspecified finger at forearm level, initial encounter: Secondary | ICD-10-CM | POA: Diagnosis not present

## 2017-11-28 DIAGNOSIS — S61209A Unspecified open wound of unspecified finger without damage to nail, initial encounter: Secondary | ICD-10-CM | POA: Diagnosis not present

## 2017-11-28 DIAGNOSIS — Y998 Other external cause status: Secondary | ICD-10-CM | POA: Insufficient documentation

## 2017-11-28 DIAGNOSIS — W260XXA Contact with knife, initial encounter: Secondary | ICD-10-CM | POA: Diagnosis not present

## 2017-11-28 DIAGNOSIS — S66323A Laceration of extensor muscle, fascia and tendon of left middle finger at wrist and hand level, initial encounter: Secondary | ICD-10-CM | POA: Diagnosis not present

## 2017-11-28 MED ORDER — AMOXICILLIN-POT CLAVULANATE 875-125 MG PO TABS
1.0000 | ORAL_TABLET | Freq: Once | ORAL | Status: AC
  Administered 2017-11-28: 1 via ORAL
  Filled 2017-11-28: qty 1

## 2017-11-28 MED ORDER — AMOXICILLIN-POT CLAVULANATE 875-125 MG PO TABS: 1.0000 | ORAL_TABLET | Freq: Two times a day (BID) | ORAL | 0 refills | Status: DC

## 2017-11-28 MED ORDER — LIDOCAINE HCL (PF) 1 % IJ SOLN
10.0000 mL | Freq: Once | INTRAMUSCULAR | Status: AC
  Administered 2017-11-28: 10 mL
  Filled 2017-11-28: qty 10

## 2017-11-28 NOTE — ED Triage Notes (Signed)
Patient ambulatory to triage with steady gait, without difficulty or distress noted; pt reports cut left 3rd finger approx 3pm today

## 2017-11-28 NOTE — ED Provider Notes (Signed)
[facility] Emergency Department Provider Note  ____________________________________________  Time seen: Approximately 8:14 PM  I have reviewed the triage vital signs and the nursing notes.   HISTORY  Chief Complaint Laceration    HPI Melissa Daniels is a 72 y.o. female patient presents the emergency department complaining of a laceration to the third digit of left hand.  Patient was using a kitchen knife when it slipped and she lacerated over the DIP joint third digit.  This affected the dorsal aspect of the finger.  Patient is not concerned about the laceration as much as she is concerned that the distal phalanx is unable to be extended.  Patient is able to straighten the fifth phalanx with manual manipulation of the unaffected hand.  However, she is unable to extend the digit.  She is able to flex the digit from extension but she is unable to extend past the DIP joint.  No other injury or complaint.  Last tetanus shot was 2 years ago.    Past Medical History:  Diagnosis Date  . Anxiety   . Arthritis    hands  . Hypercholesteremia   . ITP (idiopathic thrombocytopenic purpura)   . Malignant neoplasm of upper-outer quadrant of left breast in female, estrogen receptor positive ([place]) 01/10/2017  . Neuropathy    bilateral feet  . Osteoporosis   . Vertigo    several episodes per year    Patient Active Problem List   Diagnosis Date Noted  . Abscess of finger of right hand   . Abscess of left forearm   . Cellulitis 10/21/2017  . 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 Personal history of colonic polyps   . Benign neoplasm of cecum   . Benign neoplasm of transverse colon   . Benign neoplasm of ascending colon   . Malignant neoplasm of upper-outer quadrant of left breast in female, estrogen receptor positive ([place]) 01/10/2017  . Neuropathy 10/31/2016  . Taking medication for chronic disease 10/31/2016  . Primary osteoarthritis of right hip 10/31/2016  . Idiopathic  thrombocytopenic purpura ([place]) 10/19/2015  . Menopause 10/19/2015  . Hormone replacement therapy (HRT) 10/19/2015  . 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 Routine general medical examination at a health care facility 10/10/2014  . 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 Female stress incontinence 10/10/2014  . Colon polyp 10/10/2014  . Episodic paroxysmal anxiety disorder 10/10/2014    Past Surgical History:  Procedure Laterality Date  . BREAST BIOPSY Left 12/28/2016   stereo path pend  . BREAST LUMPECTOMY    . COLONOSCOPY WITH PROPOFOL N/A 09/08/2017   Procedure: COLONOSCOPY WITH PROPOFOL;  Surgeon: Lucilla Lame, MD;  Location: [place];  Service: Endoscopy;  Laterality: N/A;  . POLYPECTOMY  09/08/2017   Procedure: POLYPECTOMY;  Surgeon: Lucilla Lame, MD;  Location: [place];  Service: Endoscopy;;  . SPLENECTOMY, TOTAL      Prior to Admission medications   Medication Sig Start Date End Date Taking? Authorizing Provider  ALPRAZolam (XANAX) 0.25 MG tablet TAKE (1) TABLET BY MOUTH EVERY DAY AS NEEDED 11/16/17   Juline Patch, MD  amoxicillin-clavulanate (AUGMENTIN) 875-125 MG tablet Take 1 tablet by mouth 2 (two) times daily. 11/28/17   Cuthriell, Charline Bills, PA-C  Calcium Citrate-Vitamin D (CALCIUM + D PO) Take 1 tablet by mouth 2 (two) times daily.    [provider]  gabapentin (NEURONTIN) 300 MG capsule TAKE 1 CAPSULE BY MOUTH TWICE DAILY 11/22/17   Juline Patch, MD  Inulin (FIBER CHOICE)  1.5 g CHEW Chew 1 tablet by mouth daily.    [provider]  Magnesium 100 MG CAPS Take 2 capsules by mouth daily.     [provider]  meloxicam (MOBIC) 15 MG tablet Take 1 tablet (15 mg total) by mouth daily. 10/17/17   Juline Patch, MD  oxybutynin (DITROPAN-XL) 10 MG 24 hr tablet Take 1 tablet (10 mg total) by mouth at bedtime. 11/16/17   Juline Patch, MD  simvastatin (ZOCOR) 20 MG tablet TAKE ONE TABLET AT BEDTIME.  11/16/17   Juline Patch, MD    Allergies Aspirin; Macrobid [nitrofurantoin]; and Sulfa antibiotics  Family History  Problem Relation Age of Onset  . Breast cancer Neg Hx     Social History Social History   Tobacco Use  . Smoking status: Never Smoker  . Smokeless tobacco: Never Used  . Tobacco comment: smoking cessation materials not required  Substance Use Topics  . Alcohol use: No    Alcohol/week: 0.0 oz  . Drug use: No     Review of Systems  Constitutional: No fever/chills Eyes: No visual changes.  Cardiovascular: no chest pain. Respiratory: no cough. No SOB. Gastrointestinal: No abdominal pain.  No nausea, no vomiting.   Musculoskeletal: Negative for musculoskeletal pain. Skin: Positive for laceration to the dorsal third digit over the DIP joint without ability to extend the digit. Neurological: Negative for headaches, focal weakness or numbness. 10-point ROS otherwise negative.  ____________________________________________   PHYSICAL EXAM:  VITAL SIGNS: ED Triage Vitals  Enc Vitals Group     BP 11/28/17 1937 (!) 147/73     Pulse --      Resp 11/28/17 1937 18     Temp 11/28/17 1937 98 F (36.7 C)     Temp Source 11/28/17 1937 Oral     SpO2 11/28/17 1937 98 %     Weight 11/28/17 1935 148 lb (67.1 kg)     Height 11/28/17 1935 5\' 6"  (1.676 m)     Head Circumference --      Peak Flow --      Pain Score 11/28/17 1935 0     Pain Loc --      Pain Edu? --      Excl. in [place]? --      Constitutional: Alert and oriented. Well appearing and in no acute distress. Eyes: Conjunctivae are normal. PERRL. EOMI. Head: Atraumatic. Neck: No stridor.   Cardiovascular: Normal rate, regular rhythm. Normal S1 and S2.  Good peripheral circulation. Respiratory: Normal respiratory effort without tachypnea or retractions. Lungs CTAB. Good air entry to the bases with no decreased or absent breath sounds. Musculoskeletal: Full range of motion to all extremities. No gross  deformities appreciated.  Visualization of the third digit of the left hand reveals a horizontal laceration over the DIP joint to the dorsal aspect.  On exam, patient has constant flexion of the distal phalanx with no ability to extend past the DIP joint.  Patient is able to flex the digit appropriately, she is able to extend the MCP joint and the PIP joint.  She is unable to extend the DIP joint actively.  She is able to extend the DIP joint passively by using the other digit or pressing the digit against a solid object.  Sensation intact to the distal aspect.  Capillary refill less than 2 seconds.  Visualization of the laceration is smooth in nature, no visible foreign body.  Bleeding is controlled.  With extraction of the edges,  proximal and distal aspect of the extensor tendon is not visualized. Neurologic:  Normal speech and language. No gross focal neurologic deficits are appreciated.  Skin:  Skin is warm, dry and intact. No rash noted. Psychiatric: Mood and affect are normal. Speech and behavior are normal. Patient exhibits appropriate insight and judgement.   ____________________________________________   LABS (all labs ordered are listed, but only abnormal results are displayed)  Labs Reviewed - No data to display ____________________________________________  EKG   ____________________________________________  RADIOLOGY   No results found.  ____________________________________________    PROCEDURES  Procedure(s) performed:    Marland KitchenMarland KitchenLaceration Repair Date/Time: 11/28/2017 9:23 PM Performed by: Darletta Moll, PA-C Authorized by: Darletta Moll, PA-C   Consent:    Consent obtained:  Verbal   Consent given by:  Patient   Risks discussed:  Need for additional repair Anesthesia (see MAR for exact dosages):    Anesthesia method:  Nerve block   Block location:  3rd digit L hand   Block needle gauge:  27 G   Block anesthetic:  Lidocaine 1% w/o epi   Block  technique:  Digital block   Block injection procedure:  Anatomic landmarks identified, introduced needle, negative aspiration for blood and incremental injection   Block outcome:  Anesthesia achieved Laceration details:    Location:  Finger   Finger location:  L long finger   Length (cm):  2.5 Repair type:    Repair type:  Simple Pre-procedure details:    Preparation:  Patient was prepped and draped in usual sterile fashion Exploration:    Hemostasis achieved with:  Direct pressure   Wound exploration: wound explored through full range of motion and entire depth of wound probed and visualized     Wound extent: tendon damage     Wound extent: no foreign bodies/material noted, no muscle damage noted, no nerve damage noted, no underlying fracture noted and no vascular damage noted     Tendon damage location:  Upper extremity   Upper extremity tendon damage location:  Finger extensor   Finger extensor tendon:  Extensor digitorum   Tendon damage extent:  Complete transection   Tendon repair plan:  Refer for evaluation   Contaminated: no   Treatment:    Area cleansed with:  Betadine   Amount of cleaning:  Extensive   Irrigation solution:  Sterile saline   Irrigation volume:  500 ml   Irrigation method:  Syringe Skin repair:    Repair method:  Sutures   Suture size:  4-0   Suture material:  Nylon   Suture technique:  Simple interrupted   Number of sutures:  5 Approximation:    Approximation:  Close Post-procedure details:    Dressing:  Splint for protection and tube gauze   Patient tolerance of procedure:  Tolerated well, no immediate complications .Splint Application Date/Time: 08/05/2503 9:25 PM Performed by: Darletta Moll, PA-C Authorized by: Darletta Moll, PA-C   Consent:    Consent obtained:  Verbal   Consent given by:  Patient   Risks discussed:  Pain Pre-procedure details:    Sensation:  Normal Procedure details:    Laterality:  Left   Location:   Finger   Finger:  L long finger   Splint type:  Finger   Supplies:  Aluminum splint and elastic bandage Post-procedure details:    Patient tolerance of procedure:  Tolerated well, no immediate complications Comments:     Finger splint is applied to the second and third digit.  Distal phalanx of the third digit is placed in full extension, second third digit are buddy taped to the splint.  Ace bandage applied over top.      Medications  lidocaine (PF) (XYLOCAINE) 1 % injection 10 mL (has no administration in time range)  amoxicillin-clavulanate (AUGMENTIN) 875-125 MG per tablet 1 tablet (has no administration in time range)     ____________________________________________   INITIAL IMPRESSION / ASSESSMENT AND PLAN / ED COURSE  Pertinent labs & imaging results that were available during my care of the patient were reviewed by me and considered in my medical decision making (see chart for details).  Review of the [place] CSRS was performed in accordance of the [place] prior to dispensing any controlled drugs.  Clinical Course as of Nov 28 2121  Tue Nov 28, 2017  2040 Patient arrives with laceration to the third digit of left hand over the DIP joint.  Patient has an inability to extend the DIP joint and laceration is concerning for acute extensor tendon laceration.  I discussed the case with on-call orthopedic surgeon, Dr. Harlow Mares.  He recommends suturing laceration, placing patient on antibiotics, splinting the digit in full extension, follow-up with hand surgery.    [JC]    Clinical Course User Index [JC] Cuthriell, Charline Bills, PA-C     Patient's diagnosis is consistent with finger laceration with extensor tendon laceration.  Patient presents the emergency department with a laceration to the dorsal third digit.  Patient is unable to extend the digit past the DIP joint.  Edges of tendon are not visualized on exam.  I discussed the case with the on-call orthopedic surgeon who recommends  primary closure in the emergency department, placing the patient on antibiotics and follow-up with hand surgery for further repair of laceration of the tendon.  Laceration is closed as described above.  Patient tolerated well with no complications.  Splint to the third digit is applied as described above.  Wound care instructions are provided to patient.  She will be placed on Augmentin for coverage prophylactically..  Patient will follow-up with Dr. Peggye Ley for further repair of extensor tendon laceration.  Patient is given ED precautions to return to the ED for any worsening or new symptoms.     ____________________________________________  FINAL CLINICAL IMPRESSION(S) / ED DIAGNOSES  Final diagnoses:  Laceration of left middle finger without foreign body without damage to nail, initial encounter  Extensor tendon laceration of finger with open wound, initial encounter      NEW MEDICATIONS STARTED DURING THIS VISIT:  ED Discharge Orders        Ordered    amoxicillin-clavulanate (AUGMENTIN) 875-125 MG tablet  2 times daily     11/28/17 2122          This chart was dictated using voice recognition software/Dragon. Despite best efforts to proofread, errors can occur which can change the meaning. Any change was purely unintentional.    Darletta Moll, PA-C 11/28/17 2125    Nena Polio, MD 11/28/17 2342

## 2017-11-29 ENCOUNTER — Other Ambulatory Visit: Payer: Self-pay

## 2017-12-05 DIAGNOSIS — M20012 Mallet finger of left finger(s): Secondary | ICD-10-CM | POA: Diagnosis not present

## 2017-12-05 DIAGNOSIS — S56424A Laceration of extensor muscle, fascia and tendon of left middle finger at forearm level, initial encounter: Secondary | ICD-10-CM | POA: Diagnosis not present

## 2017-12-25 DIAGNOSIS — H4922 Sixth [abducent] nerve palsy, left eye: Secondary | ICD-10-CM | POA: Diagnosis not present

## 2017-12-26 ENCOUNTER — Other Ambulatory Visit: Payer: Self-pay | Admitting: Ophthalmology

## 2017-12-26 DIAGNOSIS — H532 Diplopia: Secondary | ICD-10-CM

## 2017-12-29 DIAGNOSIS — H4922 Sixth [abducent] nerve palsy, left eye: Secondary | ICD-10-CM | POA: Diagnosis not present

## 2018-01-03 ENCOUNTER — Ambulatory Visit: Payer: Medicare Other

## 2018-01-03 ENCOUNTER — Other Ambulatory Visit
Admission: RE | Admit: 2018-01-03 | Discharge: 2018-01-03 | Disposition: A | Discharge status: Home / Self Care | Payer: Medicare Other | Source: Ambulatory Visit | Attending: Ophthalmology | Admitting: Ophthalmology

## 2018-01-03 ENCOUNTER — Ambulatory Visit
Admission: RE | Admit: 2018-01-03 | Discharge: 2018-01-03 | Disposition: A | Discharge status: Home / Self Care | Payer: Medicare Other | Source: Ambulatory Visit | Attending: Ophthalmology | Admitting: Ophthalmology

## 2018-01-03 DIAGNOSIS — H532 Diplopia: Secondary | ICD-10-CM | POA: Insufficient documentation

## 2018-01-03 LAB — CREATININE, SERUM
CREATININE: 0.81 mg/dL (ref 0.44–1.00)
GFR calc Af Amer: >60 mL/min (ref >60)
GFR calc non Af Amer: >60 mL/min (ref >60)

## 2018-01-03 MED ORDER — GADOBENATE DIMEGLUMINE 529 MG/ML IV SOLN
15.0000 mL | Freq: Once | INTRAVENOUS | Status: AC | PRN
  Administered 2018-01-03: 13 mL via INTRAVENOUS

## 2018-01-04 ENCOUNTER — Ambulatory Visit: Payer: Medicare Other

## 2018-01-05 DIAGNOSIS — R9089 Other abnormal findings on diagnostic imaging of central nervous system: Secondary | ICD-10-CM | POA: Diagnosis not present

## 2018-01-05 DIAGNOSIS — H532 Diplopia: Secondary | ICD-10-CM | POA: Diagnosis not present

## 2018-01-05 DIAGNOSIS — Z9889 Other specified postprocedural states: Secondary | ICD-10-CM | POA: Diagnosis not present

## 2018-01-05 DIAGNOSIS — Z853 Personal history of malignant neoplasm of breast: Secondary | ICD-10-CM | POA: Diagnosis not present

## 2018-01-12 DIAGNOSIS — Z853 Personal history of malignant neoplasm of breast: Secondary | ICD-10-CM | POA: Diagnosis not present

## 2018-01-12 DIAGNOSIS — G939 Disorder of brain, unspecified: Secondary | ICD-10-CM | POA: Diagnosis not present

## 2018-01-12 DIAGNOSIS — R9089 Other abnormal findings on diagnostic imaging of central nervous system: Secondary | ICD-10-CM | POA: Diagnosis not present

## 2018-01-23 DIAGNOSIS — M20012 Mallet finger of left finger(s): Secondary | ICD-10-CM | POA: Diagnosis not present

## 2018-02-12 DIAGNOSIS — H532 Diplopia: Secondary | ICD-10-CM | POA: Diagnosis not present

## 2018-02-12 DIAGNOSIS — R9089 Other abnormal findings on diagnostic imaging of central nervous system: Secondary | ICD-10-CM | POA: Diagnosis not present

## 2018-02-19 ENCOUNTER — Other Ambulatory Visit: Payer: Self-pay | Admitting: Family Medicine

## 2018-02-19 DIAGNOSIS — C50912 Malignant neoplasm of unspecified site of left female breast: Secondary | ICD-10-CM | POA: Diagnosis not present

## 2018-02-19 DIAGNOSIS — C50412 Malignant neoplasm of upper-outer quadrant of left female breast: Secondary | ICD-10-CM | POA: Diagnosis not present

## 2018-02-19 DIAGNOSIS — Z853 Personal history of malignant neoplasm of breast: Secondary | ICD-10-CM | POA: Diagnosis not present

## 2018-02-19 DIAGNOSIS — Z170 Estrogen receptor positive status [ER+]: Secondary | ICD-10-CM | POA: Diagnosis not present

## 2018-02-19 DIAGNOSIS — F410 Panic disorder [episodic paroxysmal anxiety] without agoraphobia: Secondary | ICD-10-CM

## 2018-02-20 DIAGNOSIS — M20012 Mallet finger of left finger(s): Secondary | ICD-10-CM | POA: Diagnosis not present

## 2018-02-26 DIAGNOSIS — R9089 Other abnormal findings on diagnostic imaging of central nervous system: Secondary | ICD-10-CM | POA: Diagnosis not present

## 2018-02-26 DIAGNOSIS — H532 Diplopia: Secondary | ICD-10-CM | POA: Diagnosis not present

## 2018-03-08 DIAGNOSIS — R9089 Other abnormal findings on diagnostic imaging of central nervous system: Secondary | ICD-10-CM | POA: Diagnosis not present

## 2018-03-08 DIAGNOSIS — H532 Diplopia: Secondary | ICD-10-CM | POA: Diagnosis not present

## 2018-03-08 DIAGNOSIS — Z23 Encounter for immunization: Secondary | ICD-10-CM | POA: Diagnosis not present

## 2018-03-26 DIAGNOSIS — H532 Diplopia: Secondary | ICD-10-CM | POA: Diagnosis not present

## 2018-03-26 DIAGNOSIS — H5005 Alternating esotropia: Secondary | ICD-10-CM | POA: Diagnosis not present

## 2018-03-26 DIAGNOSIS — R930 Abnormal findings on diagnostic imaging of skull and head, not elsewhere classified: Secondary | ICD-10-CM | POA: Diagnosis not present

## 2018-03-26 DIAGNOSIS — H2513 Age-related nuclear cataract, bilateral: Secondary | ICD-10-CM | POA: Diagnosis not present

## 2018-03-26 DIAGNOSIS — H4922 Sixth [abducent] nerve palsy, left eye: Secondary | ICD-10-CM | POA: Insufficient documentation

## 2018-03-26 DIAGNOSIS — Q142 Congenital malformation of optic disc: Secondary | ICD-10-CM | POA: Diagnosis not present

## 2018-03-26 DIAGNOSIS — H5000 Unspecified esotropia: Secondary | ICD-10-CM | POA: Insufficient documentation

## 2018-04-30 DIAGNOSIS — R930 Abnormal findings on diagnostic imaging of skull and head, not elsewhere classified: Secondary | ICD-10-CM | POA: Diagnosis not present

## 2018-04-30 DIAGNOSIS — H2513 Age-related nuclear cataract, bilateral: Secondary | ICD-10-CM | POA: Diagnosis not present

## 2018-04-30 DIAGNOSIS — H4922 Sixth [abducent] nerve palsy, left eye: Secondary | ICD-10-CM | POA: Diagnosis not present

## 2018-05-07 ENCOUNTER — Encounter: Payer: Self-pay | Admitting: Family Medicine

## 2018-05-07 ENCOUNTER — Ambulatory Visit (INDEPENDENT_AMBULATORY_CARE_PROVIDER_SITE_OTHER): Payer: Medicare Other | Admitting: Family Medicine

## 2018-05-07 VITALS — BP 120/62 | HR 84 | Ht 66.0 in | Wt 152.0 lb

## 2018-05-07 DIAGNOSIS — M1990 Unspecified osteoarthritis, unspecified site: Secondary | ICD-10-CM

## 2018-05-07 DIAGNOSIS — F410 Panic disorder [episodic paroxysmal anxiety] without agoraphobia: Secondary | ICD-10-CM

## 2018-05-07 MED ORDER — ALPRAZOLAM 0.25 MG PO TABS: ORAL_TABLET | ORAL | 0 refills | Status: DC

## 2018-05-07 MED ORDER — MELOXICAM 7.5 MG PO TABS: 7.5000 mg | ORAL_TABLET | Freq: Every day | ORAL | 3 refills | Status: DC

## 2018-05-07 NOTE — Progress Notes (Signed)
Date:  05/07/2018   Name:  Melissa Daniels   DOB:  [birth year]   MRN:  [id number] Arthritis  Presents for follow-up visit. She complains of pain and joint swelling. She reports no stiffness or joint warmth. The symptoms have been stable. Affected locations include the right shoulder, right MCP, left MCP, right PIP, left PIP, right DIP, left DIP, right hip, right knee and left knee. Her pain is at a severity of 2/10. Associated symptoms include dry eyes. Pertinent negatives include no diarrhea, dry mouth, dysuria, fatigue, fever, pain at night, pain while resting, rash, Raynaud's syndrome, uveitis or weight loss.  Anxiety  Presents for follow-up visit. Symptoms include excessive worry. Patient reports no chest pain, compulsions, confusion, decreased concentration, depressed mood, dizziness, dry mouth, feeling of choking, hyperventilation, impotence, insomnia, irritability, malaise, muscle tension, nausea, nervous/anxious behavior, obsessions, palpitations, panic, restlessness, shortness of breath or suicidal ideas. The severity of symptoms is mild. The quality of sleep is good.      Chief Complaint: change in stools (stool has "some form, but I have to go as soon as I get up"- had colonoscopy in April of this year- showed non bleeding internal hemorrhoids and small mouthed diverticula) and Arthritis (in knees and hands- hands have gotten worse. next step?)    Review of Systems  Constitutional: Negative.  Negative for chills, fatigue, fever, irritability, unexpected weight change and weight loss.  HENT: Negative for congestion, ear discharge, ear pain, rhinorrhea, sinus pressure, sneezing and sore throat.   Eyes: Negative for photophobia, pain, discharge, redness and itching.  Respiratory: Negative for cough, shortness of breath, wheezing and stridor.   Cardiovascular: Negative for chest pain and palpitations.  Gastrointestinal: Negative for abdominal pain, blood in stool, constipation, diarrhea,  nausea and vomiting.  Endocrine: Negative for cold intolerance, heat intolerance, polydipsia, polyphagia and polyuria.  Genitourinary: Negative for dysuria, flank pain, frequency, hematuria, impotence, menstrual problem, pelvic pain, urgency, vaginal bleeding and vaginal discharge.  Musculoskeletal: Positive for arthritis and joint swelling. Negative for arthralgias, back pain, myalgias and stiffness.  Skin: Negative for rash.  Allergic/Immunologic: Negative for environmental allergies and food allergies.  Neurological: Negative for dizziness, weakness, light-headedness, numbness and headaches.  Hematological: Negative for adenopathy. Does not bruise/bleed easily.  Psychiatric/Behavioral: Negative for confusion, decreased concentration, dysphoric mood and suicidal ideas. The patient is not nervous/anxious and does not have insomnia.     Patient Active Problem List   Diagnosis Date Noted  . Abscess of finger of right hand   . Abscess of left forearm   . Cellulitis 10/21/2017  . 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 Personal history of colonic polyps   . Benign neoplasm of cecum   . Benign neoplasm of transverse colon   . Benign neoplasm of ascending colon   . Malignant neoplasm of upper-outer quadrant of left breast in female, estrogen receptor positive ([place]) 01/10/2017  . Neuropathy 10/31/2016  . Taking medication for chronic disease 10/31/2016  . Primary osteoarthritis of right hip 10/31/2016  . 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[place]) 10/19/2015  . Menopause 10/19/2015  . Hormone replacement therapy (HRT) 10/19/2015  . 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 Routine general medical examination at a health care facility 10/10/2014  . 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 Female stress incontinence 10/10/2014  . Colon polyp 10/10/2014  . Episodic paroxysmal anxiety disorder 10/10/2014    Allergies  Allergen Reactions  . Aspirin   . Macrobid [Nitrofurantoin]   . Sulfa  Antibiotics Other (See Comments)    Platelets  drop     Past Surgical History:  Procedure Laterality Date  . BREAST BIOPSY Left 12/28/2016   stereo path pend  . BREAST LUMPECTOMY    . COLONOSCOPY WITH PROPOFOL N/A 09/08/2017   Procedure: COLONOSCOPY WITH PROPOFOL;  Surgeon: Lucilla Lame, MD;  Location: [place];  Service: Endoscopy;  Laterality: N/A;  . POLYPECTOMY  09/08/2017   Procedure: POLYPECTOMY;  Surgeon: Lucilla Lame, MD;  Location: [place];  Service: Endoscopy;;  . SPLENECTOMY, TOTAL      Social History   Tobacco Use  . Smoking status: Never Smoker  . Smokeless tobacco: Never Used  . Tobacco comment: smoking cessation materials not required  Substance Use Topics  . Alcohol use: No    Alcohol/week: 0.0 standard drinks  . Drug use: No     Medication list has been reviewed and updated.  Current Meds  Medication Sig  . ALPRAZolam (XANAX) 0.25 MG tablet TAKE (1) TABLET BY MOUTH EVERY DAY AS NEEDED  . Calcium Citrate-Vitamin D (CALCIUM + D PO) Take 1 tablet by mouth 2 (two) times daily.  Marland Kitchen gabapentin (NEURONTIN) 300 MG capsule TAKE 1 CAPSULE BY MOUTH TWICE DAILY  . Inulin (FIBER CHOICE) 1.5 g CHEW Chew 1 tablet by mouth daily.  . Magnesium 100 MG CAPS Take 2 capsules by mouth daily.   Marland Kitchen oxybutynin (DITROPAN-XL) 10 MG 24 hr tablet Take 1 tablet (10 mg total) by mouth at bedtime.  . simvastatin (ZOCOR) 20 MG tablet TAKE ONE TABLET AT BEDTIME.    PHQ 2/9 Scores 08/21/2017 10/31/2016 10/19/2015 07/09/2015  PHQ - 2 Score 0 0 0 0  PHQ- 9 Score 0 - - -    Physical Exam  Constitutional: She is oriented to person, place, and time. She appears well-developed and well-nourished.  HENT:  Head: Normocephalic.  Right Ear: External ear normal.  Left Ear: External ear normal.  Mouth/Throat: Oropharynx is clear and moist.  Eyes: Pupils are equal, round, and reactive to light. Conjunctivae and EOM are normal. Lids are everted and swept, no foreign bodies found.  Left eye exhibits no hordeolum. No foreign body present in the left eye. Right conjunctiva is not injected. Left conjunctiva is not injected. No scleral icterus.  Neck: Normal range of motion. Neck supple. No JVD present. No tracheal deviation present. No thyromegaly present.  Cardiovascular: Normal rate, regular rhythm, normal heart sounds and intact distal pulses. Exam reveals no gallop and no friction rub.  No murmur heard. Pulmonary/Chest: Effort normal and breath sounds normal. No respiratory distress. She has no wheezes. She has no rales.  Abdominal: Soft. Bowel sounds are normal. She exhibits no mass. There is no hepatosplenomegaly. There is no tenderness. There is no rebound and no guarding.  Musculoskeletal: Normal range of motion. She exhibits no edema or tenderness.  Lymphadenopathy:    She has no cervical adenopathy.  Neurological: She is alert and oriented to person, place, and time. She has normal strength. She displays normal reflexes. No cranial nerve deficit.  Skin: Skin is warm. No rash noted.  Psychiatric: She has a normal mood and affect. Her mood appears not anxious. She does not exhibit a depressed mood.  Nursing note and vitals reviewed.   BP 120/62   Pulse 84   Ht 5\' 6"  (1.676 m)   Wt 152 lb (68.9 kg)   BMI 24.53 kg/m   Assessment and Plan:  1. Episodic paroxysmal anxiety disorder Chronic.  Patient only takes during panic episodes.  Will refill alprazolam  0.25 as needed for panic attack. - ALPRAZolam (XANAX) 0.25 MG tablet; TAKE (1) TABLET BY MOUTH EVERY DAY AS NEEDED  Dispense: 30 tablet; Refill: 0  2. Arthritis Chronic.  Involving upper and lower extremities as well as lower back.  Discussed different etiologies of arthritis will obtain sed rate and rheumatoid factor.  Refill low-dose meloxicam 7.5 mg once a day for trial of symptom relief. - meloxicam (MOBIC) 7.5 MG tablet; Take 1 tablet (7.5 mg total) by mouth daily.  Dispense: 30 tablet; Refill: 3 -  Sedimentation rate - Rheumatoid Factor.   Dr. Mac[facility]  05/07/2018

## 2018-05-08 ENCOUNTER — Ambulatory Visit: Payer: Medicare Other | Admitting: Family Medicine

## 2018-05-08 LAB — SEDIMENTATION RATE: Sed Rate: 3 mm/hr (ref 0–40)

## 2018-05-08 LAB — RHEUMATOID FACTOR

## 2018-05-25 ENCOUNTER — Other Ambulatory Visit: Payer: Self-pay | Admitting: Family Medicine

## 2018-05-25 DIAGNOSIS — E7849 Other hyperlipidemia: Secondary | ICD-10-CM

## 2018-06-04 DIAGNOSIS — R9089 Other abnormal findings on diagnostic imaging of central nervous system: Secondary | ICD-10-CM | POA: Diagnosis not present

## 2018-06-04 DIAGNOSIS — H532 Diplopia: Secondary | ICD-10-CM | POA: Diagnosis not present

## 2018-07-04 DIAGNOSIS — R930 Abnormal findings on diagnostic imaging of skull and head, not elsewhere classified: Secondary | ICD-10-CM | POA: Diagnosis not present

## 2018-07-04 DIAGNOSIS — H532 Diplopia: Secondary | ICD-10-CM | POA: Diagnosis not present

## 2018-07-04 DIAGNOSIS — H5005 Alternating esotropia: Secondary | ICD-10-CM | POA: Diagnosis not present

## 2018-07-04 DIAGNOSIS — H2513 Age-related nuclear cataract, bilateral: Secondary | ICD-10-CM | POA: Diagnosis not present

## 2018-08-02 DIAGNOSIS — R42 Dizziness and giddiness: Secondary | ICD-10-CM | POA: Diagnosis not present

## 2018-08-02 DIAGNOSIS — H532 Diplopia: Secondary | ICD-10-CM | POA: Diagnosis not present

## 2018-08-10 ENCOUNTER — Other Ambulatory Visit: Payer: Self-pay | Admitting: Family Medicine

## 2018-08-10 DIAGNOSIS — N393 Stress incontinence (female) (male): Secondary | ICD-10-CM

## 2018-08-10 DIAGNOSIS — E7849 Other hyperlipidemia: Secondary | ICD-10-CM

## 2018-08-14 DIAGNOSIS — R42 Dizziness and giddiness: Secondary | ICD-10-CM | POA: Diagnosis not present

## 2018-08-22 ENCOUNTER — Telehealth: Payer: Self-pay

## 2018-08-22 ENCOUNTER — Ambulatory Visit (INDEPENDENT_AMBULATORY_CARE_PROVIDER_SITE_OTHER): Payer: Medicare Other

## 2018-08-22 ENCOUNTER — Other Ambulatory Visit: Payer: Self-pay

## 2018-08-22 VITALS — Ht 66.0 in | Wt 153.0 lb

## 2018-08-22 DIAGNOSIS — Z0000 Encounter for general adult medical examination without abnormal findings: Secondary | ICD-10-CM | POA: Diagnosis not present

## 2018-08-22 DIAGNOSIS — R42 Dizziness and giddiness: Secondary | ICD-10-CM | POA: Diagnosis not present

## 2018-08-22 NOTE — Progress Notes (Signed)
Subjective:   Melissa Daniels is a 73 y.o. female who presents for Medicare Annual (Subsequent) preventive examination.  Review of Systems:   Cardiac Risk Factors include: advanced age (>27men, >17 women);dyslipidemia     Objective:     Vitals: Ht 5\' 6"  (1.676 m)   Wt 153 lb (69.4 kg)   BMI 24.69 kg/m   Body mass index is 24.69 kg/m.  Advanced Directives 08/22/2018 10/21/2017 10/21/2017 10/21/2017 09/08/2017 08/21/2017  Does Patient Have a Medical Advance Directive? Yes - No Yes Yes Yes  Type of Advance Directive Living will;Healthcare Power of [place];Living will [place];Living will Healthcare Power of [place];Living will  Copy of [place] in Chart? No - copy requested - - - Yes No - copy requested  Would patient like information on creating a medical advance directive? - No - Patient declined - - - -    Tobacco Social History   Tobacco Use  Smoking Status Never Smoker  Smokeless Tobacco Never Used  Tobacco Comment   smoking cessation materials not required     Counseling given: Not Answered Comment: smoking cessation materials not required   Clinical Intake:  Pre-visit preparation completed: Yes  Pain : No/denies pain     BMI - recorded: 24.69 Nutritional Status: BMI of 19-24  Normal Nutritional Risks: None Diabetes: No  How often do you need to have someone help you when you read instructions, pamphlets, or other written materials from your doctor or pharmacy?: 1 - Never What is the last grade level you completed in school?: 12th grade  Interpreter Needed?: No  Information entered by :: Clemetine Marker LPN  Past Medical History:  Diagnosis Date  . Anxiety   . Arthritis    hands  . Double vision   . Hypercholesteremia   . ITP (idiopathic thrombocytopenic purpura)   . Malignant neoplasm of upper-outer quadrant of left breast in female, estrogen receptor  positive ([place]) 01/10/2017  . Neuropathy    bilateral feet  . Osteoporosis   . Vertigo    several episodes per year   Past Surgical History:  Procedure Laterality Date  . BREAST BIOPSY Left 12/28/2016   stereo path pend  . BREAST LUMPECTOMY    . COLONOSCOPY WITH PROPOFOL N/A 09/08/2017   Procedure: COLONOSCOPY WITH PROPOFOL;  Surgeon: Lucilla Lame, MD;  Location: [facility];  Service: Endoscopy;  Laterality: N/A;  . POLYPECTOMY  09/08/2017   Procedure: POLYPECTOMY;  Surgeon: Lucilla Lame, MD;  Location: [facility];  Service: Endoscopy;;  . SPLENECTOMY, TOTAL     Family History  Problem Relation Age of Onset  . Heart disease Mother   . Colon cancer Mother   . Hypertension Mother   . Heart disease Father   . Hypertension Father   . Breast cancer Neg Hx    Social History   Socioeconomic History  . Marital status: Widowed    Spouse name: Not on file  . Number of children: 3  . Years of education: Not on file  . Highest education level: 12th grade  Occupational History  . Occupation: Retired  Scientific laboratory technician  . Financial resource strain: Not hard at all  . Food insecurity:    Worry: Never true    Inability: Never true  . Transportation needs:    Medical: No    Non-medical: No  Tobacco Use  . Smoking status: Never Smoker  . Smokeless tobacco: Never  Used  . Tobacco comment: smoking cessation materials not required  Substance and Sexual Activity  . Alcohol use: No    Alcohol/week: 0.0 standard drinks  . Drug use: No  . Sexual activity: Not Currently  Lifestyle  . Physical activity:    Days per week: 3 days    Minutes per session: 60 min  . Stress: Not at all  Relationships  . Social connections:    Talks on phone: More than three times a week    Gets together: Three times a week    Attends religious service: More than 4 times per year    Active member of club or organization: No    Attends meetings of clubs or organizations: Never    Relationship  status: Widowed  Other Topics Concern  . Not on file  Social History Narrative  . Not on file    Outpatient Encounter Medications as of 08/22/2018  Medication Sig  . ALPRAZolam (XANAX) 0.25 MG tablet TAKE (1) TABLET BY MOUTH EVERY DAY AS NEEDED  . Calcium Citrate-Vitamin D (CALCIUM + D PO) Take 1 tablet by mouth 2 (two) times daily.  . DULoxetine (CYMBALTA) 60 MG capsule Take by mouth.  . gabapentin (NEURONTIN) 300 MG capsule TAKE 1 CAPSULE BY MOUTH TWICE DAILY  . Inulin (FIBER CHOICE) 1.5 g CHEW Chew 1 tablet by mouth daily.  . Magnesium 100 MG CAPS Take 2 capsules by mouth daily.   . multivitamin-lutein (OCUVITE-LUTEIN) CAPS capsule Take 1 capsule by mouth daily.  Marland Kitchen oxybutynin (DITROPAN-XL) 10 MG 24 hr tablet TAKE ONE TABLET BY MOUTH AT BEDTIME.  . simvastatin (ZOCOR) 20 MG tablet TAKE ONE TABLET BY MOUTH AT BEDTIME  . vitamin E 400 UNIT capsule Take 400 Units by mouth daily.  . [DISCONTINUED] meloxicam (MOBIC) 7.5 MG tablet Take 1 tablet (7.5 mg total) by mouth daily.   No facility-administered encounter medications on file as of 08/22/2018.     Activities of Daily Living In your present state of health, do you have any difficulty performing the following activities: 08/22/2018 10/21/2017  Hearing? N N  Comment declines hearing aids -  Vision? N N  Difficulty concentrating or making decisions? N N  Walking or climbing stairs? N N  Dressing or bathing? N N  Doing errands, shopping? N N  Preparing Food and eating ? N -  Using the Toilet? N -  In the past six months, have you accidently leaked urine? Y -  Comment wears liners for protection -  Do you have problems with loss of bowel control? N -  Managing your Medications? N -  Managing your Finances? N -  Housekeeping or managing your Housekeeping? N -  Some recent data might be hidden    Patient Care Team: Juline Patch, MD as PCP - General (Family Medicine) Cornelia Copa, DO as Consulting Physician (Surgical  Oncology) Reeder-Hayes, Aundra Millet, MD as Consulting Physician (Oncology) Pa, [place] as Consulting Physician [facility])    Assessment:   This is a routine wellness examination for [name].  Exercise Activities and Dietary recommendations Current Exercise Habits: Structured exercise class, Type of exercise: strength training/weights;stretching;calisthenics, Time (Minutes): 60, Frequency (Times/Week): 3, Weekly Exercise (Minutes/Week): 180, Intensity: Moderate, Exercise limited by: None identified  Goals    . DIET - INCREASE WATER INTAKE     Recommend to drink at least 6-8 8oz glasses of water per day.    . Weight (lb) < 150 lb (68 kg)  Pt states she would like to lose 5 lbs       Fall Risk Fall Risk  08/22/2018 08/21/2017 10/31/2016 10/19/2015 07/09/2015  Falls in the past year? 0 No No No No  Number falls in past yr: 0 - - - -  Injury with Fall? 0 - - - -  Risk for fall due to : - Impaired vision - - -  Risk for fall due to: Comment - wears eyeglasses - - -  Follow up Falls prevention discussed - - - -   FALL RISK PREVENTION PERTAINING TO THE HOME:  Any stairs in or around the home? Yes  If so, do they handrails? Yes   Home free of loose throw rugs in walkways, pet beds, electrical cords, etc? Yes  Adequate lighting in your home to reduce risk of falls? Yes   ASSISTIVE DEVICES UTILIZED TO PREVENT FALLS:  Life alert? No  Use of a cane, walker or w/c? No  Grab bars in the bathroom? No  Shower chair or bench in shower? No  Elevated toilet seat or a handicapped toilet? Yes   DME ORDERS:  DME order needed?  No   TIMED UP AND GO:  Was the test performed? No . Telephonic visit Length of time to ambulate 10 feet:  sec.   Education: Fall risk prevention has been discussed.  Intervention(s) required? No   Depression Screen PHQ 2/9 Scores 08/22/2018 08/21/2017 10/31/2016 10/19/2015  PHQ - 2 Score 0 0 0 0  PHQ- 9 Score - 0 - -     Cognitive Function     6CIT  Screen 08/22/2018 08/21/2017 10/31/2016  What Year? 0 points 0 points 0 points  What month? 0 points 0 points 0 points  What time? 0 points 0 points 0 points  Count back from 20 0 points 0 points 0 points  Months in reverse 0 points 0 points 0 points  Repeat phrase 0 points 4 points 0 points  Total Score 0 4 0    Immunization History  Administered Date(s) Administered  . Influenza, High Dose Seasonal PF 03/08/2018  . Influenza-Unspecified 01/29/2015, 02/27/2017  . Meningococcal Conjugate 08/17/2014  . Pneumococcal Conjugate-13 05/30/2013  . Pneumococcal Polysaccharide-23 04/02/2012  . Tdap 10/19/2015    Qualifies for Shingles Vaccine? Yes . Due for Shingrix. Education has been provided regarding the importance of this vaccine. Pt has been advised to call insurance company to determine out of pocket expense. Advised may also receive vaccine at local pharmacy or Health Dept. Verbalized acceptance and understanding. Pt declines.   Tdap: Up to date  Flu Vaccine: Up to date  Pneumococcal Vaccine: Up to date   Screening Tests Health Maintenance  Topic Date Due  . MAMMOGRAM  02/20/2019  . COLONOSCOPY  09/09/2022  . TETANUS/TDAP  10/18/2025  . INFLUENZA VACCINE  Completed  . DEXA SCAN  Completed  . Hepatitis C Screening  Completed  . PNA vac Low Risk Adult  Completed    Cancer Screenings:  Colorectal Screening: Completed 09/08/17. Repeat every 5 years.   Mammogram: Completed 02/19/18. Repeat every year.  Bone Density: Completed 03/08/17. Results reflect OSTEOPENIA. Repeat every 2 years.   Lung Cancer Screening: (Low Dose CT Chest recommended if Age 58-80 years, 30 pack-year currently smoking OR have quit w/in 15years.) does not qualify.   Additional Screening:  Hepatitis C Screening: does qualify; Completed 10/31/16  Vision Screening: Recommended annual ophthalmology exams for early detection of glaucoma and other disorders of the eye. Is  the patient up to date with their  annual eye exam?  Yes Who is the provider or what is the name of the office in which the pt attends annual eye exams? [place]  Dental Screening: Recommended annual dental exams for proper oral hygiene  Community Resource Referral:  CRR required this visit?  No      Plan:    I have personally reviewed and addressed the Medicare Annual Wellness questionnaire and have noted the following in the patient's chart:  A. Medical and social history B. Use of alcohol, tobacco or illicit drugs  C. Current medications and supplements D. Functional ability and status E.  Nutritional status F.  Physical activity G. Advance directives H. List of other physicians I.  Hospitalizations, surgeries, and ER visits in previous 12 months J.  [place] such as hearing and vision if needed, cognitive and depression L. Referrals and appointments   In addition, I have reviewed and discussed with patient certain preventive protocols, quality metrics, and best practice recommendations. A written personalized care plan for preventive services as well as general preventive health recommendations were provided to patient.   Signed,  Clemetine Marker, LPN Nurse Health Advisor   Nurse Notes: Pt doing well and appreciative of telphonic AWV today.

## 2018-08-27 ENCOUNTER — Ambulatory Visit: Payer: Medicare Other

## 2018-10-15 DIAGNOSIS — L570 Actinic keratosis: Secondary | ICD-10-CM | POA: Diagnosis not present

## 2018-10-15 DIAGNOSIS — Z872 Personal history of diseases of the skin and subcutaneous tissue: Secondary | ICD-10-CM | POA: Diagnosis not present

## 2018-10-15 DIAGNOSIS — L578 Other skin changes due to chronic exposure to nonionizing radiation: Secondary | ICD-10-CM | POA: Diagnosis not present

## 2018-11-19 ENCOUNTER — Encounter: Payer: Medicare Other | Admitting: Family Medicine

## 2018-11-19 DIAGNOSIS — H5005 Alternating esotropia: Secondary | ICD-10-CM | POA: Diagnosis not present

## 2018-11-19 DIAGNOSIS — G939 Disorder of brain, unspecified: Secondary | ICD-10-CM | POA: Diagnosis not present

## 2018-11-19 DIAGNOSIS — H532 Diplopia: Secondary | ICD-10-CM | POA: Diagnosis not present

## 2018-11-19 DIAGNOSIS — H543 Unqualified visual loss, both eyes: Secondary | ICD-10-CM | POA: Diagnosis not present

## 2018-11-19 DIAGNOSIS — H2513 Age-related nuclear cataract, bilateral: Secondary | ICD-10-CM | POA: Diagnosis not present

## 2018-11-19 DIAGNOSIS — R930 Abnormal findings on diagnostic imaging of skull and head, not elsewhere classified: Secondary | ICD-10-CM | POA: Diagnosis not present

## 2018-11-27 ENCOUNTER — Encounter: Payer: Self-pay | Admitting: Family Medicine

## 2018-11-27 ENCOUNTER — Ambulatory Visit (INDEPENDENT_AMBULATORY_CARE_PROVIDER_SITE_OTHER): Payer: Medicare Other | Admitting: Family Medicine

## 2018-11-27 ENCOUNTER — Other Ambulatory Visit: Payer: Self-pay

## 2018-11-27 VITALS — BP 120/80 | HR 80 | Ht 66.0 in | Wt 148.0 lb

## 2018-11-27 DIAGNOSIS — Z0000 Encounter for general adult medical examination without abnormal findings: Secondary | ICD-10-CM

## 2018-11-27 DIAGNOSIS — D693 Immune thrombocytopenic purpura: Secondary | ICD-10-CM | POA: Diagnosis not present

## 2018-11-27 DIAGNOSIS — E7849 Other hyperlipidemia: Secondary | ICD-10-CM

## 2018-11-27 DIAGNOSIS — R69 Illness, unspecified: Secondary | ICD-10-CM | POA: Diagnosis not present

## 2018-11-27 NOTE — Progress Notes (Signed)
Date:  11/27/2018   Name:  Melissa Daniels   DOB:  1946/01/13   MRN:  [id number]   Chief Complaint: Annual Exam  Patient is a 73 year old female who presents for a comprehensive physical exam. The patient reports the following problems:alternating esotropia. Health maintenance has been reviewed up to date.   Review of Systems  Constitutional: Negative for chills and fever.  HENT: Negative for drooling, ear discharge, ear pain and sore throat.   Eyes: Positive for visual disturbance.  Respiratory: Negative for cough, shortness of breath and wheezing.   Cardiovascular: Negative for chest pain, palpitations and leg swelling.  Gastrointestinal: Negative for abdominal pain, blood in stool, constipation, diarrhea and nausea.  Endocrine: Negative for polydipsia.  Genitourinary: Negative for dysuria, frequency, hematuria and urgency.  Musculoskeletal: Negative for back pain, myalgias and neck pain.  Skin: Negative for rash.  Allergic/Immunologic: Negative for environmental allergies.  Neurological: Negative for dizziness and headaches.       Balance  Hematological: Does not bruise/bleed easily.  Psychiatric/Behavioral: Negative for suicidal ideas. The patient is not nervous/anxious.     Patient Active Problem List   Diagnosis Date Noted  . Sixth (abducent) nerve palsy, 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  . Abscess of finger of right hand   . Abscess of left forearm   . Cellulitis 10/21/2017  . 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 Personal history of colonic polyps   . Benign neoplasm of cecum   . Benign neoplasm of transverse colon   . Benign neoplasm of ascending colon   . Malignant neoplasm of upper-outer quadrant of left breast in female, estrogen receptor positive ([place]) 01/10/2017  . Neuropathy 10/31/2016  . Taking medication for chronic disease 10/31/2016  . Primary osteoarthritis of right hip 10/31/2016  . 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[place]) 10/19/2015  . Menopause 10/19/2015  . Hormone replacement  therapy (HRT) 10/19/2015  . 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 Routine general medical examination at a health care facility 10/10/2014  . 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 Female stress incontinence 10/10/2014  . Colon polyp 10/10/2014  . Episodic paroxysmal anxiety disorder 10/10/2014    Allergies  Allergen Reactions  . Aspirin   . Macrobid [Nitrofurantoin]   . 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 Sulfa Antibiotics Other (See Comments)    Platelets drop     Past Surgical History:  Procedure Laterality Date  . BREAST BIOPSY Left 12/28/2016   stereo path pend  . BREAST LUMPECTOMY    . COLONOSCOPY WITH PROPOFOL N/A 09/08/2017   Procedure: COLONOSCOPY WITH PROPOFOL;  Surgeon: Lucilla Lame, MD;  Location: [place] [facility];  Service: Endoscopy;  Laterality: N/A;  . POLYPECTOMY  09/08/2017   Procedure: POLYPECTOMY;  Surgeon: Lucilla Lame, MD;  Location: [place];  Service: Endoscopy;;  . SPLENECTOMY, TOTAL      Social History   Tobacco Use  . Smoking status: Never Smoker  . Smokeless tobacco: Never Used  . Tobacco comment: smoking cessation materials not required  Substance Use Topics  . Alcohol use: No    Alcohol/week: 0.0 standard drinks  . Drug use: No     Medication list has been reviewed and updated.  Current Meds  Medication Sig  . ALPRAZolam (XANAX) 0.25 MG tablet TAKE (1) TABLET BY MOUTH EVERY DAY AS NEEDED  . Calcium Citrate-Vitamin D (CALCIUM + D PO) Take 1 tablet  by mouth 2 (two) times daily.  . DULoxetine (CYMBALTA) 60 MG capsule Take by mouth.  . gabapentin (NEURONTIN) 300 MG capsule Take 300 mg by mouth daily.  . Inulin (FIBER CHOICE) 1.5 g CHEW Chew 1 tablet by mouth daily.  . Magnesium 400 MG CAPS Take 1 capsule by mouth 2 (two) times daily.   . Multiple Vitamins-Minerals (OCUVITE ADULT 50+ PO)  Take 1 each by mouth 2 (two) times daily.  Marland Kitchen oxybutynin (DITROPAN-XL) 5 MG 24 hr tablet Take 5 mg by mouth at bedtime.  . senna (SENOKOT) 8.6 MG tablet Take 1 tablet by mouth as needed for constipation.  . simvastatin (ZOCOR) 20 MG tablet TAKE ONE TABLET BY MOUTH AT BEDTIME  . vitamin E 400 UNIT capsule Take 400 Units by mouth daily.  . [DISCONTINUED] oxybutynin (DITROPAN-XL) 10 MG 24 hr tablet TAKE ONE TABLET BY MOUTH AT BEDTIME.    PHQ 2/9 Scores 11/27/2018 08/22/2018 08/21/2017 10/31/2016  PHQ - 2 Score 0 0 0 0  PHQ- 9 Score 1 - 0 -    BP Readings from Last 3 Encounters:  11/27/18 120/80  05/07/18 120/62  11/28/17 130/69    Physical Exam Vitals signs and nursing note reviewed.  Constitutional:      Appearance: Normal appearance. She is well-developed, well-groomed and normal weight.  HENT:     Head: Normocephalic.     Jaw: There is normal jaw occlusion.     Right Ear: Hearing, tympanic membrane, ear canal and external ear normal.     Left Ear: Hearing, tympanic membrane, ear canal and external ear normal.     Nose: Nose normal.     Right Turbinates: Not enlarged, swollen or pale.     Left Turbinates: Not enlarged, swollen or pale.     Mouth/Throat:     Lips: Pink.     Mouth: Mucous membranes are moist.     Dentition: Normal dentition.     Tongue: No lesions. Tongue does not deviate from midline.     Pharynx: Oropharynx is clear. Uvula midline.  Eyes:     General: Lids are normal. Lids are everted, no foreign bodies appreciated. Vision grossly intact. No scleral icterus.       Left eye: No foreign body or hordeolum.     Extraocular Movements: Extraocular movements intact.     Conjunctiva/sclera: Conjunctivae normal.     Right eye: Right conjunctiva is not injected.     Left eye: Left conjunctiva is not injected.     Pupils: Pupils are equal, round, and reactive to light.  Neck:     Musculoskeletal: Normal range of motion and neck supple.     Thyroid: No thyroid mass,  thyromegaly or thyroid tenderness.     Vascular: Normal carotid pulses. No carotid bruit, hepatojugular reflux or JVD.     Trachea: Trachea and phonation normal. No tracheal deviation.  Cardiovascular:     Rate and Rhythm: Normal rate and regular rhythm.  No extrasystoles are present.    Chest Wall: PMI is not displaced. No thrill.     Pulses: No decreased pulses.          Carotid pulses are 2+ on the right side and 2+ on the left side.      Radial pulses are 2+ on the right side and 2+ on the left side.       Femoral pulses are 2+ on the right side and 2+ on the left side.      Popliteal  pulses are 2+ on the right side and 2+ on the left side.       Dorsalis pedis pulses are 2+ on the right side and 2+ on the left side.       Posterior tibial pulses are 2+ on the right side and 2+ on the left side.     Heart sounds: Normal heart sounds, S1 normal and S2 normal. No murmur. No systolic murmur. No diastolic murmur. No friction rub. No gallop. No S3 sounds.   Pulmonary:     Effort: Pulmonary effort is normal. No respiratory distress.     Breath sounds: Normal breath sounds. No decreased air movement. No decreased breath sounds, wheezing, rhonchi or rales.  Chest:     Chest wall: No mass.     Breasts:        Right: Normal. No swelling, bleeding, inverted nipple, mass, nipple discharge, skin change or tenderness.        Left: Normal. No swelling, bleeding, inverted nipple, mass, nipple discharge, skin change or tenderness.  Abdominal:     General: Bowel sounds are normal.     Palpations: Abdomen is soft. There is no hepatomegaly, splenomegaly or mass.     Tenderness: There is no abdominal tenderness. There is no guarding or rebound.     Hernia: There is no hernia in the umbilical area or ventral area.  Genitourinary:    Rectum: Normal. Guaiac result negative. No mass.  Musculoskeletal: Normal range of motion.        General: No tenderness.     Cervical back: Normal.     Thoracic back:  Normal.     Lumbar back: Normal.     Right lower leg: No edema.     Left lower leg: No edema.  Lymphadenopathy:     Head:     Right side of head: No submental, submandibular or tonsillar adenopathy.     Left side of head: No submental, submandibular or tonsillar adenopathy.     Cervical: No cervical adenopathy.     Right cervical: No superficial, deep or posterior cervical adenopathy.    Left cervical: No superficial, deep or posterior cervical adenopathy.     Upper Body:     Right upper body: No supraclavicular, axillary or pectoral adenopathy.     Left upper body: No supraclavicular, axillary or pectoral adenopathy.  Skin:    General: Skin is warm.     Capillary Refill: Capillary refill takes less than 2 seconds.     Findings: No rash.  Neurological:     Mental Status: She is alert and oriented to person, place, and time.     Cranial Nerves: Cranial nerves are intact. No cranial nerve deficit.     Sensory: Sensation is intact.     Motor: Motor function is intact.     Coordination: Coordination is intact.     Deep Tendon Reflexes: Reflexes normal.  Psychiatric:        Attention and Perception: Attention and perception normal.        Mood and Affect: Mood and affect normal. Mood is not anxious or depressed.        Speech: Speech normal.        Behavior: Behavior is cooperative.        Cognition and Memory: Cognition normal.     Wt Readings from Last 3 Encounters:  11/27/18 148 lb (67.1 kg)  08/22/18 153 lb (69.4 kg)  05/07/18 152 lb (68.9 kg)    BP  120/80   Pulse 80   Ht 5\' 6"  (1.676 m)   Wt 148 lb (67.1 kg)   BMI 23.89 kg/m   Assessment and Plan:  1. 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HCC) history of ITP will check CBC including platelet count. - CBC with Differential/Platelet  2. Familial multiple lipoprotein-type hyperlipidemia History of hyperlipidemia we will check lipid panel. - Lipid panel  3. Taking medication for chronic disease Patient on  medications for cholesterol-lipid control.  Will check comprehensive metabolic panel - Comprehensive metabolic panel  4. Annual physical exam No subjective objective concerns noted on history physical exam.  Reviewed previous encounters, labs, and imaging.  Check comprehensive metabolic panel. Melissa Daniels is a 73 y.o. female who presents today for her Complete Annual Exam. She feels well. She reports exercising   She reports she is sleeping well. Immunizations are reviewed and recommendations provided.   Age appropriate screening tests are discussed. Counseling given for risk factor reduction interventions. - Comprehensive metabolic panel - CBC with Differential/Platelet - Lipid panel I spent 34 minutes with this patient, More than 50% of that time was spent in face to face education, counseling and care coordination.

## 2018-11-28 LAB — LIPID PANEL
Chol/HDL Ratio: 3.5 ratio (ref 0.0–4.4)
Cholesterol, Total: 197 mg/dL (ref 100–199)
HDL: 56 mg/dL (ref >39)
LDL Calculated: 120 mg/dL — ABNORMAL HIGH (ref 0–99)
Triglycerides: 105 mg/dL (ref 0–149)
VLDL Cholesterol Cal: 21 mg/dL (ref 5–40)

## 2018-11-28 LAB — CBC WITH DIFFERENTIAL/PLATELET
Basophils Absolute: 0.1 10*3/uL (ref 0.0–0.2)
Basos: 1 %
EOS (ABSOLUTE): 0.2 10*3/uL (ref 0.0–0.4)
Eos: 4 %
Hematocrit: 41.1 % (ref 34.0–46.6)
Hemoglobin: 13.5 g/dL (ref 11.1–15.9)
Immature Grans (Abs): 0 10*3/uL (ref 0.0–0.1)
Immature Granulocytes: 0 %
Lymphocytes Absolute: 1.7 10*3/uL (ref 0.7–3.1)
Lymphs: 37 %
MCH: 30.9 pg (ref 26.6–33.0)
MCHC: 32.8 g/dL (ref 31.5–35.7)
MCV: 94 fL (ref 79–97)
Monocytes Absolute: 0.8 10*3/uL (ref 0.1–0.9)
Monocytes: 17 %
Neutrophils Absolute: 1.9 10*3/uL (ref 1.4–7.0)
Neutrophils: 41 %
Platelets: 266 10*3/uL (ref 150–450)
RBC: 4.37 x10E6/uL (ref 3.77–5.28)
RDW: 12.8 % (ref 11.7–15.4)
WBC: 4.7 10*3/uL (ref 3.4–10.8)

## 2018-11-28 LAB — COMPREHENSIVE METABOLIC PANEL
ALT: 18 IU/L (ref 0–32)
AST: 23 IU/L (ref 0–40)
Albumin/Globulin Ratio: 1.9 (ref 1.2–2.2)
Albumin: 4.5 g/dL (ref 3.7–4.7)
Alkaline Phosphatase: 98 IU/L (ref 39–117)
BUN/Creatinine Ratio: 20 (ref 12–28)
BUN: 14 mg/dL (ref 8–27)
Bilirubin Total: 0.5 mg/dL (ref 0.0–1.2)
CO2: 26 mmol/L (ref 20–29)
Calcium: 9.3 mg/dL (ref 8.7–10.3)
Chloride: 101 mmol/L (ref 96–106)
Creatinine, Ser: 0.69 mg/dL (ref 0.57–1.00)
GFR calc Af Amer: 100 mL/min/{1.73_m2} (ref >59)
GFR calc non Af Amer: 87 mL/min/{1.73_m2} (ref >59)
Globulin, Total: 2.4 g/dL (ref 1.5–4.5)
Glucose: 85 mg/dL (ref 65–99)
Potassium: 4.6 mmol/L (ref 3.5–5.2)
Sodium: 142 mmol/L (ref 134–144)
Total Protein: 6.9 g/dL (ref 6.0–8.5)

## 2018-12-03 DIAGNOSIS — H532 Diplopia: Secondary | ICD-10-CM | POA: Diagnosis not present

## 2018-12-03 DIAGNOSIS — R9089 Other abnormal findings on diagnostic imaging of central nervous system: Secondary | ICD-10-CM | POA: Diagnosis not present

## 2018-12-05 DIAGNOSIS — H2513 Age-related nuclear cataract, bilateral: Secondary | ICD-10-CM | POA: Diagnosis not present

## 2018-12-07 ENCOUNTER — Other Ambulatory Visit: Payer: Self-pay | Admitting: Family Medicine

## 2018-12-07 DIAGNOSIS — E7849 Other hyperlipidemia: Secondary | ICD-10-CM

## 2018-12-14 ENCOUNTER — Ambulatory Visit: Payer: Medicare Other | Attending: Otolaryngology | Admitting: Physical Therapy

## 2018-12-14 ENCOUNTER — Other Ambulatory Visit: Payer: Self-pay

## 2018-12-14 ENCOUNTER — Encounter: Payer: Self-pay | Admitting: Physical Therapy

## 2018-12-14 DIAGNOSIS — R42 Dizziness and giddiness: Secondary | ICD-10-CM | POA: Diagnosis not present

## 2018-12-14 NOTE — Therapy (Signed)
[facility] [address] [place], [place], [address] Phone: [phone]   Fax:  [phone]  Physical Therapy Evaluation  Patient Details  Name: Melissa Daniels MRN: [id number] Date of Birth: 12-Jun-1945 Referring Provider (PT): Dr. Kathyrn Sheriff   Encounter Date: 12/14/2018  PT End of Session - 12/14/18 1142    Visit Number  1    Number of Visits  9    Date for PT Re-Evaluation  02/08/19    Authorization Type  1/10 progress note    PT Start Time  0855    PT Stop Time  0949    PT Time Calculation (min)  54 min    Equipment Utilized During Treatment  Gait belt    Activity Tolerance  Patient tolerated treatment well       Past Medical History:  Diagnosis Date  . Anxiety   . Arthritis    hands  . Double vision   . Hypercholesteremia   . ITP (idiopathic thrombocytopenic purpura)   . Malignant neoplasm of upper-outer quadrant of left breast in female, estrogen receptor positive ([place]) 01/10/2017  . Neuropathy    bilateral feet  . Osteoporosis   . Vertigo    several episodes per year    Past Surgical History:  Procedure Laterality Date  . BREAST BIOPSY Left 12/28/2016   stereo path pend  . BREAST LUMPECTOMY    . COLONOSCOPY WITH PROPOFOL N/A 09/08/2017   Procedure: COLONOSCOPY WITH PROPOFOL;  Surgeon: Lucilla Lame, MD;  Location: [place];  Service: Endoscopy;  Laterality: N/A;  . POLYPECTOMY  09/08/2017   Procedure: POLYPECTOMY;  Surgeon: Lucilla Lame, MD;  Location: [place];  Service: Endoscopy;;  . SPLENECTOMY, TOTAL      There were no vitals filed for this visit.   Subjective Assessment - 12/14/18 1251    Subjective  Patient reports she does not have dizziness but rather reports imbalance and veering and staggering when walking at times.    Pertinent History  Patient reports that her imbalance/dizziness problems began last year. Patient denies vertigo. Patient reports that she also began to have issued  with double vision which began in July 2019. 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 Patient report she has been wearing a prism applique on the left lens of her eyeglasses which has corrected her double vision. Patient reports she is going to get new lens with the prism embedded in the lens. Patient being followed by the [facility] and [facility]. Patient reports she thought at first that her dizziness/imbalance symptoms were related to the Abducens nerve palsy. Patient describes her dizziness as imbalance and denies vertigo. Patient reports she gets imbalance symptoms daily multiple times each day. Patient reports she feels off-balance, staggers and veers when she walks. Patient reports her "eyes feel funny" at times but denies oscillopsia. Patient reports that head turns, quick movements, bending over, looking up and quick turns all bring on her symptoms and are aggravating factors and reports that holding onto something helps ease her symptoms. Patient denies falls. Patient states she is able to do her chores but states she moves slower on the steps because she is afraid of falling. Patient reports her laundry is in the basement and she has 7 steps  to enter her home. Patient reports she went to Dr. Kathyrn Sheriff at [facility] and had VNG testing on 08/14/2018 which revealed 34% left [facility] per medical record.         [facility] PT Assessment - 12/14/18 1159      Assessment   Medical Diagnosis  dizziness and giddiness    Referring Provider (PT)  Dr. Kathyrn Sheriff    Prior Therapy  none      Precautions   Precautions  None      Restrictions   Weight Bearing Restrictions  No      Balance Screen   Has the patient fallen in the past 6 months  No    Has the patient had a decrease in activity level  because of a fear of falling?   No    Is the patient reluctant to leave their home because of a fear of falling?   No      Home Environment   Living Environment  Private residence    Available Help at Discharge  Family;Friend(s)    Type of [place]ander to enter    Entrance Stairs-Number of Steps  7    Entrance Stairs-Rails  Left    Home Layout  Two level;Laundry or work area in basement    Alternate Therapist, sports of Steps  1 flight      Prior Function   Level of Independence  Independent with community mobility without device      Cognition   Overall Cognitive Status  Within Functional Limits for tasks assessed      Dynamic Gait Index   Level Surface  Normal    Change in Gait Speed  Mild Impairment    Gait with Horizontal Head Turns  Mild Impairment    Gait with Vertical Head Turns  Normal    Gait and Pivot Turn  Mild Impairment    Step Over Obstacle  Normal    Step Around Obstacles  Normal    Steps  Normal    Total Score  21      VESTIBULAR AND BALANCE EVALUATION   HISTORY:  Subjective history of current problem: Patient reports that her imbalance/dizziness problems began last year. Patient denies vertigo. Patient reports that she also began to have issued with double vision which began in July 2019. 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 Patient report she has been wearing a prism applique on the left lens of her eyeglasses which has corrected her double vision. Patient reports she is going to get new lens with the prism embedded in the lens. Patient being [facility] by the [facility] and [facility]. Patient reports she thought at first that her dizziness/imbalance symptoms were related to the Abducens nerve palsy. Patient describes  her dizziness as imbalance and denies vertigo. Patient reports she gets imbalance symptoms daily multiple times each day. Patient reports she feels off-balance, staggers and veers when she walks. Patient reports her "eyes feel funny" at times but denies oscillopsia. Patient reports that head turns, quick movements, bending over, looking up and quick turns all bring on her symptoms and are aggravating factors and reports that holding onto something helps ease her symptoms. Patient denies falls. Patient states she is able  to do her chores but states she moves slower on the steps because she is afraid of falling. Patient reports her laundry is in the basement and she has 7 steps to enter her home. Patient reports she went to Dr. Kathyrn Sheriff at [facility] and had VNG testing on 08/14/2018 which revealed 34% left [facility] per medical record.   Description of dizziness: unsteadiness Frequency: daily Duration: lasts while moving Symptom nature: motion provoked, intermittent Provocative Factors:  Easing Factors: holding on to something to steady herself  Progression of symptoms: no change since onset History of similar episodes: no  Falls (yes/no): no Number of falls in past 6 months: 0  Auditory complaints (tinnitus, pain, drainage): denies Vision (last eye exam, diplopia, recent changes): patient wears glasses with prism applique on left lens. Patient reports she has cataracts but has not had cataract surgery yet.    EXAMINATION  POSTURE:  Left shoulder lower than right shoulder in standing  MUSCULOSKELETAL SCREEN: Cervical Spine ROM: AROM cervical spine flexion, extension, right and left rotation WNL without discomfort  Gait: Patient arrives ambulating without AD. Patient ambulates with slow cadence and noted imbalance with head turns and body turns with ambulation  Scanning of visual environment with gait is: fair  Balance: Patient is challenged by narrow base of support, eyes closed, uneven  surfaces, ambulation with body turns and head turns  POSTURAL CONTROL TESTS:  Clinical Test of Sensory Interaction for Balance    (CTSIB):  CONDITION TIME STRATEGY SWAY  Eyes open, firm surface 30 seconds ankle +1  Eyes closed, firm surface 30 seconds Ankle, hip +2  Eyes open, foam surface 30 seconds ankle +1  Eyes closed, foam surface 30 seconds Ankle, hip +3    OCULOMOTOR / VESTIBULAR TESTING:  Oculomotor Exam- Room Light  Normal Abnormal Comments  Ocular Alignment N    Ocular ROM N    Spontaneous Nystagmus N    Gaze evoked Nystagmus N    Smooth Pursuit N    Saccades  Abn Slow speed with left field saccades  VOR  Abn Reports blurring of target  VOR Cancellation N  Denies symptoms  Left Head Impulse N    Right Head Impulse N      FUNCTIONAL OUTCOME MEASURES:  Results Comments  DHI     18/100 low perception of handicap  ABC Scale       87.5% Safe for community mobility  DGI       21/24 Falls risk; in need of intervention   Neuromuscular Re-education: VOR X 1 exercise:   Demonstrated and educated as to VOR X1.  Patient performed VOR X 1 horizontal in sitting 3 reps of 1 minute each with verbal cues for technique.  Patient reports the target is blurring and moving at times. Denies dizziness Issued for HEP.   Non-compliant/ Firm Surface: On firm surface, patient performed feet together and semi-tandem progressions with alternating lead leg with and without horizontal and vertical head turns.  Patient reports mild increase in imbalance and noted increased sway with head turns, horizontal more than vertical.  Issued for HEP.   PT Education - 12/14/18 1141    Education Details  discussed plan of care and home exercise program: issued slides 1,3,4 & 5 from vestibular handout- VOR, feet together and semi-tandem progressions with horizontal and vertical head turns    Person(s) Educated  Patient    Methods  Explanation;Demonstration;Verbal cues;Handout    Comprehension   Verbalized understanding;Returned demonstration;Verbal cues required  PT Short Term Goals - 12/14/18 1150      PT SHORT TERM GOAL #1   Title  Patient will be able to perform home program independently for self-management.    Time  4    Period  Weeks    Status  New    Target Date  01/11/19        PT Long Term Goals - 12/14/18 1150      PT LONG TERM GOAL #1   Title  Patient will report 50% or greater improvement in her symptoms of dizziness and imbalance with provoking motions or positions.    Time  8    Period  Weeks    Status  New    Target Date  02/08/19      PT LONG TERM GOAL #2   Title  Patient will be able to ambulate with head turns and quick turns without evidence of imbalance in order to reduce patient's falls risk.    Time  8    Period  Weeks    Status  New    Target Date  02/08/19             Plan - 12/14/18 1143    Clinical Impression Statement  Patient's chief complaints are the sensation of imbalance, veering and staggering at times when she walks. Patient denies vertigo. 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 Patient demonstrates +2 sway with feet together eyes closed on firm surface and +3 sway with eyes closed on compliant surfaces. Patient scored 21/24 on the DGI and 87.5% on the ABC scale. Patient would benefit from PT services to address functional deficits and goals as set on plan of care and in order to help reduce patient's symptoms of imbalance and reduce patient's falls risk.    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, left and episodic paroxysmal anxiety disorder    Examination-Activity Limitations  Stairs    Stability/Clinical Decision Making  Stable/Uncomplicated    Clinical Decision Making  Low    Rehab Potential  Excellent    PT Frequency  1x / week    PT Duration  8 weeks    PT Treatment/Interventions  Canalith  Repostioning;Stair training;Gait training;Therapeutic exercise;Therapeutic activities;Balance training;Neuromuscular re-education;Vestibular;Patient/family education    PT Next Visit Plan  review HEP; try Airex pad activities, amb with head turns, body wall rolls    PT Home Exercise Plan  VOR X1 in sitting, 1 minute reps, ST and FT on firm with hori and vert head turns    Consulted and Agree with Plan of Care  Patient       Patient will benefit from skilled therapeutic intervention in order to improve the following deficits and impairments:  Decreased balance, Dizziness, Difficulty walking  Visit Diagnosis: 1. Dizziness and giddiness        Problem List Patient Active Problem List   Diagnosis Date Noted  . Sixth (abducent) nerve palsy, 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  . Abscess of finger of right hand   . Abscess of left forearm   . Cellulitis 10/21/2017  . 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 Personal history of colonic polyps   . Benign neoplasm of cecum   . Benign neoplasm of transverse colon   . Benign neoplasm of ascending colon   . Malignant neoplasm of upper-outer quadrant of left breast in female, estrogen receptor positive ([place]) 01/10/2017  . Neuropathy 10/31/2016  . Taking medication  for chronic disease 10/31/2016  . Primary osteoarthritis of right hip 10/31/2016  . 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[place]) 10/19/2015  . Menopause 10/19/2015  . Hormone replacement therapy (HRT) 10/19/2015  . 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 Routine general medical examination at a health care facility 10/10/2014  . 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 Female stress incontinence 10/10/2014  . Colon polyp 10/10/2014  . Episodic paroxysmal anxiety disorder 10/10/2014    Murphy,Dorriea 12/14/2018, 1:05 PM  [place] [facility] [address] [place], [place], [address] Phone: [phone]   Fax:  [phone]  Name: Melissa Daniels MRN:  [id number] Date of Birth: 02/20/1946

## 2018-12-21 ENCOUNTER — Ambulatory Visit: Payer: Medicare Other | Admitting: Physical Therapy

## 2018-12-21 ENCOUNTER — Encounter: Payer: Self-pay | Admitting: Physical Therapy

## 2018-12-21 ENCOUNTER — Other Ambulatory Visit: Payer: Self-pay

## 2018-12-21 DIAGNOSIS — R42 Dizziness and giddiness: Secondary | ICD-10-CM

## 2018-12-21 NOTE — Therapy (Signed)
[place] [facility] [address] [place], [place], [address] Phone: [phone]   Fax:  [phone]  Physical Therapy Treatment  Patient Details  Name: Melissa Daniels MRN: [id number] Date of Birth: [birth year] Referring Provider (PT): Dr. Kathyrn Sheriff   Encounter Date: 12/21/2018  PT End of Session - 12/21/18 0800    Visit Number  2    Number of Visits  9    Date for PT Re-Evaluation  02/08/19    Authorization Type  1/10 progress note    PT Start Time  0758    PT Stop Time  0843    PT Time Calculation (min)  45 min    Equipment Utilized During Treatment  Gait belt    Activity Tolerance  Patient tolerated treatment well    Behavior During Therapy  [facility] for tasks assessed/performed       Past Medical History:  Diagnosis Date  . Anxiety   . Arthritis    hands  . Double vision   . Hypercholesteremia   . ITP (idiopathic thrombocytopenic purpura)   . Malignant neoplasm of upper-outer quadrant of left breast in female, estrogen receptor positive ([place]) 01/10/2017  . Neuropathy    bilateral feet  . Osteoporosis   . Vertigo    several episodes per year    Past Surgical History:  Procedure Laterality Date  . BREAST BIOPSY Left 12/28/2016   stereo path pend  . BREAST LUMPECTOMY    . COLONOSCOPY WITH PROPOFOL N/A 09/08/2017   Procedure: COLONOSCOPY WITH PROPOFOL;  Surgeon: Lucilla Lame, MD;  Location: [place];  Service: Endoscopy;  Laterality: N/A;  . POLYPECTOMY  09/08/2017   Procedure: POLYPECTOMY;  Surgeon: Lucilla Lame, MD;  Location: [place];  Service: Endoscopy;;  . SPLENECTOMY, TOTAL      There were no vitals filed for this visit.  Subjective Assessment - 12/21/18 0759    Subjective  Patient reports she has been doing her exercises. Patient reports her neck is sore. Patient states she did not know how unsteady she was and how much she "needed this" in regards to physical therapy until she started coming to the  clinic.    Pertinent History  Patient reports that her imbalance/dizziness problems began last year. Patient denies vertigo. Patient reports that she also began to have issued with double vision which began in July 2019. 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 Patient report she has been wearing a prism applique on the left lens of her eyeglasses which has corrected her double vision. Patient reports she is going to get new lens with the prism embedded in the lens. Patient being followed by the [facility] and [facility]. Patient reports she thought at first that her dizziness/imbalance symptoms were related to the Abducens nerve palsy. Patient describes her dizziness as imbalance and denies vertigo. Patient reports she gets imbalance symptoms daily multiple times each day. Patient reports she feels off-balance, staggers and veers when she walks. Patient reports her "eyes feel funny" at times but denies oscillopsia. Patient reports that head turns, quick movements, bending over, looking up and quick turns all bring on her symptoms and are aggravating factors and reports that holding onto something helps ease her symptoms. Patient denies falls. Patient states she  is able to do her chores but states she moves slower on the steps because she is afraid of falling. Patient reports her laundry is in the basement and she has 7 steps to enter her home. Patient reports she went to Dr. Kathyrn Sheriff at [facility] and had VNG testing on 08/14/2018 which revealed 34% left [facility] per medical record.    Currently in Pain?  No/denies       Neuromuscular Re-education: Patient reports that her neck was a little sore from doing her HEP this past week. Reviewed HEP and offered reeducation as to proper technique as well  as techniques to help decrease any neck discomfort. Patient in agreement and reported that the techniques did feel better.   VOR X 1 exercise:  Patient performed VOR X 1 horizontal in standing 3 reps of 1 minute each with verbal cues for technique. Initially patient was demonstrating increased head excursion and not keeping eyes on the target. Patient was able to demonstrate significantly improved technique after cuing.  Patient reports the target is staying in focus and no blurring this date.   Airex pad:  On Airex pad, patient performed feet together progressions and semi-tandem progressions with alternating lead leg with and without body turns and horizontal and vertical head turns with CGA and vc for technique to perform slower head turns within comfortable neck range of motion and instructed that patient did not need to turn head to end range.  Patient with evidence of imbalance and demonstrated good use of ankle and hip strategies to recover losses of balance.   Walking while scanning for visual targets: Performed ambulation 175' trials of forwards and retro ambulation while scanning for visual targets in hallway with CGA. Patient with mild veering or missteps at time but able to self-correct.  Patient denies dizziness but reports imbalance.   Diona Foley toss to self:  Patient performed static standing while tossing ball to self horizontally while tracking ball with head and eyes.  Then, performed multiple 175' trials of forward and retro ambulation while tossing ball and tracking ball with head and eyes with CGA.  Patient with mild veering or missteps at time but able to self-correct.  Patient denies dizziness but reports imbalance.   Ball toss over shoulder: Patient performed multiple 175' trials of forward ambulation while tossing ball over one shoulder with return catch over opposite shoulder with CGA.  Patient with imbalance at times with this activity as well.  Airex balance beam: On  Airex balance beam, performed sideways stepping with and without horizontal head turns 5' times 4 reps.   PT Education - 12/21/18 0759    Education Details  Reviewed home exercise program and reeducated as to proper technique; added progression of standing VOR and feet together and semi-tandem stance with body turns    Person(s) Educated  Patient    Methods  Explanation;Demonstration;Verbal cues    Comprehension  Verbalized understanding;Returned demonstration       PT Short Term Goals - 12/14/18 1150      PT SHORT TERM GOAL #1   Title  Patient will be able to perform home program independently for self-management.    Time  4    Period  Weeks    Status  New    Target Date  01/11/19        PT Long Term Goals - 12/14/18 1150      PT LONG TERM GOAL #1   Title  Patient will report 50% or greater improvement in  her symptoms of dizziness and imbalance with provoking motions or positions.    Time  8    Period  Weeks    Status  New    Target Date  02/08/19      PT LONG TERM GOAL #2   Title  Patient will be able to ambulate with head turns and quick turns without evidence of imbalance in order to reduce patient's falls risk.    Time  8    Period  Weeks    Status  New    Target Date  02/08/19            Plan - 12/21/18 0800    Clinical Impression Statement  Patient reports compliance with home exercise program but required verbal cuing and demonstration on proper techniques with HEP. Patient able to progress to VOR X1 in standing and added body turn progression to HEP. Patient challenged by actvities on uneven surfaces and ambulation with head turns this date. Patient would benefit from continued PT services to further address goals as set on plan of care and to reduce patient's symtpoms of imbalance.    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, left and episodic paroxysmal anxiety disorder     Examination-Activity Limitations  Stairs    Stability/Clinical Decision Making  Stable/Uncomplicated    Rehab Potential  Excellent    PT Frequency  1x / week    PT Duration  8 weeks    PT Treatment/Interventions  Canalith Repostioning;Stair training;Gait training;Therapeutic exercise;Therapeutic activities;Balance training;Neuromuscular re-education;Vestibular;Patient/family education    PT Next Visit Plan  review HEP; try Airex pad activities, amb with head turns, body wall rolls    PT Home Exercise Plan  VOR X1 in sitting, 1 minute reps, ST and FT on firm with hori and vert head turns    Consulted and Agree with Plan of Care  Patient       Patient will benefit from skilled therapeutic intervention in order to improve the following deficits and impairments:  Decreased balance, Dizziness, Difficulty walking  Visit Diagnosis: 1. Dizziness and giddiness        Problem List Patient Active Problem List   Diagnosis Date Noted  . Sixth (abducent) nerve palsy, 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  . Abscess of finger of right hand   . Abscess of left forearm   . Cellulitis 10/21/2017  . 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 Personal history of colonic polyps   . Benign neoplasm of cecum   . Benign neoplasm of transverse colon   . Benign neoplasm of ascending colon   . Malignant neoplasm of upper-outer quadrant of left breast in female, estrogen receptor positive ([place]) 01/10/2017  . Neuropathy 10/31/2016  . Taking medication for chronic disease 10/31/2016  . Primary osteoarthritis of right hip 10/31/2016  . 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[place]) 10/19/2015  . Menopause 10/19/2015  . Hormone replacement therapy (HRT) 10/19/2015  . 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 Routine general medical examination at a health care facility 10/10/2014  . 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 Female stress incontinence 10/10/2014  . Colon polyp 10/10/2014  . Episodic paroxysmal anxiety disorder  10/10/2014    Lady Deutscher PT, DPT [phone] Lady Deutscher 12/21/2018, 11:25 AM  [facility] [address] [place], [place], [address] Phone: [phone]   Fax:  [phone]  Name: Melissa Daniels MRN: [id number] Date of Birth: 1946/02/14

## 2018-12-26 ENCOUNTER — Encounter: Payer: Self-pay | Admitting: Physical Therapy

## 2018-12-26 ENCOUNTER — Other Ambulatory Visit: Payer: Self-pay

## 2018-12-26 ENCOUNTER — Ambulatory Visit: Payer: Medicare Other | Admitting: Physical Therapy

## 2018-12-26 DIAGNOSIS — R42 Dizziness and giddiness: Secondary | ICD-10-CM

## 2018-12-26 NOTE — Therapy (Signed)
[facility] [address] [place], [place], [address] Phone: [phone]   Fax:  [phone]  Physical Therapy Treatment  Patient Details  Name: Melissa Daniels MRN: [id number] Date of Birth: [birth year] Referring Provider (PT): Dr. Kathyrn Sheriff   Encounter Date: 12/26/2018  PT End of Session - 12/26/18 0802    Visit Number  3    Number of Visits  9    Date for PT Re-Evaluation  02/08/19    Authorization Type  3/10 progress note    PT Start Time  0759    PT Stop Time  0845    PT Time Calculation (min)  46 min    Equipment Utilized During Treatment  Gait belt    Activity Tolerance  Patient tolerated treatment well    Behavior During Therapy  [facility] for tasks assessed/performed       Past Medical History:  Diagnosis Date  . Anxiety   . Arthritis    hands  . Double vision   . Hypercholesteremia   . ITP (idiopathic thrombocytopenic purpura)   . Malignant neoplasm of upper-outer quadrant of left breast in female, estrogen receptor positive ([place]) 01/10/2017  . Neuropathy    bilateral feet  . Osteoporosis   . Vertigo    several episodes per year    Past Surgical History:  Procedure Laterality Date  . BREAST BIOPSY Left 12/28/2016   stereo path pend  . BREAST LUMPECTOMY    . COLONOSCOPY WITH PROPOFOL N/A 09/08/2017   Procedure: COLONOSCOPY WITH PROPOFOL;  Surgeon: Lucilla Lame, MD;  Location: [place];  Service: Endoscopy;  Laterality: N/A;  . POLYPECTOMY  09/08/2017   Procedure: POLYPECTOMY;  Surgeon: Lucilla Lame, MD;  Location: [place];  Service: Endoscopy;;  . SPLENECTOMY, TOTAL      There were no vitals filed for this visit.  Subjective Assessment - 12/26/18 0800    Subjective  Patient reports she was away over the weekend and did not get to do her exercises every day but does report she did her exercise program some days.    Pertinent History  Patient reports that her imbalance/dizziness problems began last  year. Patient denies vertigo. Patient reports that she also began to have issued with double vision which began in July 2019. 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 Patient report she has been wearing a prism applique on the left lens of her eyeglasses which has corrected her double vision. Patient reports she is going to get new lens with the prism embedded in the lens. Patient being followed by [facility] and [facility]. Patient reports she thought at first that her dizziness/imbalance symptoms were related to the Abducens nerve palsy. Patient describes her dizziness as imbalance and denies vertigo. Patient reports she gets imbalance symptoms daily multiple times each day. Patient reports she feels off-balance, staggers and veers when she walks. Patient reports her "eyes feel funny" at times but denies oscillopsia. Patient reports that head turns, quick movements, bending over, looking up and quick turns all bring on her symptoms and are aggravating factors and reports that holding onto something helps ease her symptoms. Patient denies falls. Patient states she is able to do her chores but states she moves slower on the steps  because she is afraid of falling. Patient reports her laundry is in the basement and she has 7 steps to enter her home. Patient reports she went to Dr. Kathyrn Sheriff at [facility] and had VNG testing on 08/14/2018 which revealed 34% left [facility] per medical record.    Diagnostic tests  VNG testing revealed L UVH per MR    Patient Stated Goals  to decrease unsteadiness symptoms      Neuromuscular Re-education:  VOR X 1 exercise:  Patient performed VOR X 1 horizontal in standing on Airex pad with conflicting background 3 reps of 1 minute each with verbal cues for technique.   Patient cued to keep eyes on target and to perform horiz head turns as well as chin tuck technique.  Patient reports increased imbalance with this activity.  Airex pad:  On Airex pad, patient performed feet together progressions and semi-tandem progressions with alternating lead leg with eyes closed trials and then horizontal head turns with CGA.  Patient challenged by all activities with eyes closed and demonstrated increased sway.  Ambulation with head turns:  Patient performed 175' trials of forwards and retro ambulation with head turns looking for targets on walls with CGA.  Patient demonstrates very mild sway at times but no lose of balance and no veering this date.  Wobble board: On wooden wobble board, worked on side to side and anterior/posterior sways with CGA to Min A.  Patient reaching a few times for // bar for support. Patient demonstrating improved ankle and hip righting reactions with practice on wobble board.    PT Education - 12/26/18 0801    Education Details  discussed progression of VOR to conflicting background in standing    Person(s) Educated  Patient    Methods  Explanation;Verbal cues    Comprehension  Verbalized understanding       PT Short Term Goals - 12/14/18 1150      PT SHORT TERM GOAL #1   Title  Patient will be able to perform home program independently for self-management.    Time  4    Period  Weeks    Status  New    Target Date  01/11/19        PT Long Term Goals - 12/14/18 1150      PT LONG TERM GOAL #1   Title  Patient will report 50% or greater improvement in her symptoms of dizziness and imbalance with provoking motions or positions.    Time  8    Period  Weeks    Status  New    Target Date  02/08/19      PT LONG TERM GOAL #2   Title  Patient will be able to ambulate with head turns and quick turns without evidence of imbalance in order to reduce patient's falls risk.    Time  8    Period  Weeks    Status  New    Target Date   02/08/19            Plan - 12/26/18 0802    Clinical Impression Statement  Patient progressing well with therapy and was able to progress to VOR X 1 with conflicting background in standing. In addition, patient demonstrated improved balance with amb with head turn activities this date. Patient would benefit from continued PT services to further work on functional deficits and improve patient's symptoms.    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, left and episodic paroxysmal anxiety disorder    Examination-Activity Limitations  Stairs    Stability/Clinical Decision Making  Stable/Uncomplicated    Rehab Potential  Excellent    PT Frequency  1x / week    PT Duration  8 weeks    PT Treatment/Interventions  Canalith Repostioning;Stair training;Gait training;Therapeutic exercise;Therapeutic activities;Balance training;Neuromuscular re-education;Vestibular;Patient/family education    PT Next Visit Plan  review HEP; try Airex pad activities, amb with head turns, body wall rolls    PT Home Exercise Plan  VOR X1 in sitting, 1 minute reps, ST and FT on firm with hori and vert head turns    Consulted and Agree with Plan of Care  Patient       Patient will benefit from skilled therapeutic intervention in order to improve the following deficits and impairments:  Decreased balance, Dizziness, Difficulty walking  Visit Diagnosis: 1. Dizziness and giddiness        Problem List Patient Active Problem List   Diagnosis Date Noted  . Sixth (abducent) nerve palsy, 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  . Abscess of finger of right hand   . Abscess of left forearm   . Cellulitis 10/21/2017  . 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 Personal history of colonic polyps   . Benign neoplasm of cecum   . Benign neoplasm of transverse colon   . Benign neoplasm of ascending colon   . Malignant neoplasm of upper-outer quadrant of left breast in female, estrogen  receptor positive ([place]) 01/10/2017  . Neuropathy 10/31/2016  . Taking medication for chronic disease 10/31/2016  . Primary osteoarthritis of right hip 10/31/2016  . 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[place]) 10/19/2015  . Menopause 10/19/2015  . Hormone replacement therapy (HRT) 10/19/2015  . 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 Routine general medical examination at a health care facility 10/10/2014  . 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 Female stress incontinence 10/10/2014  . Colon polyp 10/10/2014  . Episodic paroxysmal anxiety disorder 10/10/2014   Lady Deutscher PT, DPT [phone] Lady Deutscher 12/26/2018, 12:34 PM  [facility] [address] [place], [place], [address] Phone: [phone]   Fax:  [phone]  Name: Melissa Daniels MRN: [id number] Date of Birth: 11/13/1945

## 2018-12-28 ENCOUNTER — Encounter: Payer: Medicare Other | Admitting: Physical Therapy

## 2019-01-04 ENCOUNTER — Encounter: Payer: Medicare Other | Admitting: Physical Therapy

## 2019-01-11 ENCOUNTER — Ambulatory Visit: Payer: Medicare Other | Attending: Otolaryngology | Admitting: Physical Therapy

## 2019-01-11 ENCOUNTER — Encounter: Payer: Self-pay | Admitting: Physical Therapy

## 2019-01-11 ENCOUNTER — Other Ambulatory Visit: Payer: Self-pay

## 2019-01-11 DIAGNOSIS — R42 Dizziness and giddiness: Secondary | ICD-10-CM | POA: Diagnosis not present

## 2019-01-11 NOTE — Therapy (Signed)
[facility] [address] [place], [place], [address] Phone: [phone]   Fax:  [phone]  Physical Therapy Treatment  Patient Details  Name: Melissa Daniels MRN: [id number] Date of Birth: 10-23-1945 Referring Provider (PT): Dr. Kathyrn Sheriff   Encounter Date: 01/11/2019  PT End of Session - 01/11/19 0805    Visit Number  4    Number of Visits  9    Date for PT Re-Evaluation  02/08/19    Authorization Type  3/10 progress note    PT Start Time  0802    PT Stop Time  0847    PT Time Calculation (min)  45 min    Equipment Utilized During Treatment  Gait belt    Activity Tolerance  Patient tolerated treatment well    Behavior During Therapy  [facility] for tasks assessed/performed       Past Medical History:  Diagnosis Date  . Anxiety   . Arthritis    hands  . Double vision   . Hypercholesteremia   . ITP (idiopathic thrombocytopenic purpura)   . Malignant neoplasm of upper-outer quadrant of left breast in female, estrogen receptor positive ([place]) 01/10/2017  . Neuropathy    bilateral feet  . Osteoporosis   . Vertigo    several episodes per year    Past Surgical History:  Procedure Laterality Date  . BREAST BIOPSY Left 12/28/2016   stereo path pend  . BREAST LUMPECTOMY    . COLONOSCOPY WITH PROPOFOL N/A 09/08/2017   Procedure: COLONOSCOPY WITH PROPOFOL;  Surgeon: Lucilla Lame, MD;  Location: [place];  Service: Endoscopy;  Laterality: N/A;  . POLYPECTOMY  09/08/2017   Procedure: POLYPECTOMY;  Surgeon: Lucilla Lame, MD;  Location: [place];  Service: Endoscopy;;  . SPLENECTOMY, TOTAL      There were no vitals filed for this visit.  Subjective Assessment - 01/11/19 0804    Subjective  Patient states she has been doing her home exercise program and that the exercises are getting easier. Patient states she cannot tell much difference in her symptoms.    Pertinent History  Patient reports that her imbalance/dizziness  problems began last year. Patient denies vertigo. Patient reports that she also began to have issued with double vision which began in July 2019. 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 Patient report she has been wearing a prism applique on the left lens of her eyeglasses which has corrected her double vision. Patient reports she is going to get new lens with the prism embedded in the lens. Patient being followed by the [facility] and [facility]. Patient reports she thought at first that her dizziness/imbalance symptoms were related to the Abducens nerve palsy. Patient describes her dizziness as imbalance and denies vertigo. Patient reports she gets imbalance symptoms daily multiple times each day. Patient reports she feels off-balance, staggers and veers when she walks. Patient reports her "eyes feel funny" at times but denies oscillopsia. Patient reports that head turns, quick movements, bending over, looking up and quick turns all bring on her symptoms and are aggravating factors and reports that holding onto something helps ease her symptoms. Patient denies falls. Patient states she is able to do her chores but states she moves slower on the steps because  she is afraid of falling. Patient reports her laundry is in the basement and she has 7 steps to enter her home. Patient reports she went to Dr. Kathyrn Sheriff at [facility] and had VNG testing on 08/14/2018 which revealed 34% left [facility] per medical record.    Diagnostic tests  VNG testing revealed L UVH per MR    Patient Stated Goals  to decrease unsteadiness symptoms       Neuromuscular Re-education:  Patient reports her main difficulty is with imbalance and that she is not having much dizziness at all.  VOR X 1 exercise:  Patient performed  VOR X 1 horizontal in standing on Airex pad with conflicting background 3 reps of 1 minute each two horizontal and one vertical.  Patient demonstrated good technique this date.  Patient denies dizziness with this activity.   Wooden balance beam: On wooden balance beam, patient performed sidestepping left/right  with horizontal head turns, then static sideways stance with vertical head turns and then sidestepping with vertical head turns multiple reps times 9' each.  Patient utilizing ankle and hip strategies to regain losses of balance.  Patient reaching out for intermittent touch on // bar to regain balance with sidestepping with horizontal head turns and required one to finger support for sidestepping with vertical head turns.   Cone tapping: In standing on Airex pad, patient performed foot tapping to cones in series of one, two and three cones as called out by therapist with CGA.  Patient improved with practice and was better able to control movements after practice.   Dynadisc Activity:  Patient stood with one foot on Airex balance pad and other foot on green Dynadisc and worked on forward lunge with 5 second holds 5 reps each leg with CGA. Patient stood with purple Dynadisc under one foot and green Dynadisc under other foot static balance with and without horizontal and vertical head turns multiple reps. Patient reaching at times for // bar for support.      PT Education - 01/11/19 0804    Education Details  discussed plan of care and HEP    Person(s) Educated  Patient    Methods  Explanation    Comprehension  Verbalized understanding       PT Short Term Goals - 01/11/19 1033      PT SHORT TERM GOAL #1   Title  Patient will be able to perform home program independently for self-management.    Time  4    Period  Weeks    Status  Achieved    Target Date  01/11/19        PT Long Term Goals - 01/11/19 1034      PT LONG TERM GOAL #1   Title  Patient will report 50% or  greater improvement in her symptoms of dizziness and imbalance with provoking motions or positions.    Time  8    Period  Weeks    Status  On-going      PT LONG TERM GOAL #2   Title  Patient will be able to ambulate with head turns and quick turns without evidence of imbalance in order to reduce patient's falls risk.    Time  8    Period  Weeks    Status  On-going            Plan - 01/11/19 0805    Clinical Impression Statement  Patient worked on high level balance activities this date. Patient improving with being able  to utilize hip and ankle strategies to regain balance. Patient challenged by single leg stance on uneven surfaces, Dynadisc activities and narrow base of support activities. Patient reports compliance with home exercise program. Patient able to demonstrate VOR X 1 exercise with good technique without cuing this date. Will plan on continuing to work on progressions of balance exercise to further address goals.    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, left and episodic paroxysmal anxiety disorder    Examination-Activity Limitations  Stairs    Stability/Clinical Decision Making  Stable/Uncomplicated    Rehab Potential  Excellent    PT Frequency  1x / week    PT Duration  8 weeks    PT Treatment/Interventions  Canalith Repostioning;Stair training;Gait training;Therapeutic exercise;Therapeutic activities;Balance training;Neuromuscular re-education;Vestibular;Patient/family education    PT Next Visit Plan  review HEP; try Airex pad activities, amb with head turns, body wall rolls    PT Home Exercise Plan  VOR X1 in sitting, 1 minute reps, ST and FT on firm with hori and vert head turns    Consulted and Agree with Plan of Care  Patient       Patient will benefit from skilled therapeutic intervention in order to improve the following deficits and impairments:  Decreased balance, Dizziness,  Difficulty walking  Visit Diagnosis: 1. Dizziness and giddiness        Problem List Patient Active Problem List   Diagnosis Date Noted  . Sixth (abducent) nerve palsy, 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  . Abscess of finger of right hand   . Abscess of left forearm   . Cellulitis 10/21/2017  . 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 Personal history of colonic polyps   . Benign neoplasm of cecum   . Benign neoplasm of transverse colon   . Benign neoplasm of ascending colon   . Malignant neoplasm of upper-outer quadrant of left breast in female, estrogen receptor positive ([place]) 01/10/2017  . Neuropathy 10/31/2016  . Taking medication for chronic disease 10/31/2016  . Primary osteoarthritis of right hip 10/31/2016  . 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[place]) 10/19/2015  . Menopause 10/19/2015  . Hormone replacement therapy (HRT) 10/19/2015  . 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 Routine general medical examination at a health care facility 10/10/2014  . 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 Female stress incontinence 10/10/2014  . Colon polyp 10/10/2014  . Episodic paroxysmal anxiety disorder 10/10/2014   Lady Deutscher PT, DPT [phone] Lady Deutscher 01/11/2019, 10:53 AM  [place] [facility] [address] [place], [place], [address] Phone: [phone]   Fax:  [phone]  Name: Melissa Daniels MRN: [id number] Date of Birth: Oct 04, 1945

## 2019-01-17 ENCOUNTER — Encounter: Payer: Self-pay | Admitting: Physical Therapy

## 2019-01-17 ENCOUNTER — Other Ambulatory Visit: Payer: Self-pay

## 2019-01-17 ENCOUNTER — Ambulatory Visit: Payer: Medicare Other | Admitting: Physical Therapy

## 2019-01-17 DIAGNOSIS — R42 Dizziness and giddiness: Secondary | ICD-10-CM | POA: Diagnosis not present

## 2019-01-17 NOTE — Therapy (Signed)
[facility] [address] [place], [place], [address] Phone: [phone]   Fax:  [phone]  Physical Therapy Treatment  Patient Details  Name: Melissa Daniels MRN: [id number] Date of Birth: May 18, 1946 Referring Provider (PT): Dr. Kathyrn Sheriff   Encounter Date: 01/17/2019  PT End of Session - 01/17/19 0900    Visit Number  5    Number of Visits  9    Date for PT Re-Evaluation  02/08/19    Authorization Type  4/10 progress note    PT Start Time  0855    PT Stop Time  0944    PT Time Calculation (min)  49 min    Equipment Utilized During Treatment  Gait belt    Activity Tolerance  Patient tolerated treatment well    Behavior During Therapy  [facility] for tasks assessed/performed       Past Medical History:  Diagnosis Date  . Anxiety   . Arthritis    hands  . Double vision   . Hypercholesteremia   . ITP (idiopathic thrombocytopenic purpura)   . Malignant neoplasm of upper-outer quadrant of left breast in female, estrogen receptor positive ([place]) 01/10/2017  . Neuropathy    bilateral feet  . Osteoporosis   . Vertigo    several episodes per year    Past Surgical History:  Procedure Laterality Date  . BREAST BIOPSY Left 12/28/2016   stereo path pend  . BREAST LUMPECTOMY    . COLONOSCOPY WITH PROPOFOL N/A 09/08/2017   Procedure: COLONOSCOPY WITH PROPOFOL;  Surgeon: Lucilla Lame, MD;  Location: [place];  Service: Endoscopy;  Laterality: N/A;  . POLYPECTOMY  09/08/2017   Procedure: POLYPECTOMY;  Surgeon: Lucilla Lame, MD;  Location: [place];  Service: Endoscopy;;  . SPLENECTOMY, TOTAL      There were no vitals filed for this visit.  Subjective Assessment - 01/17/19 0950    Subjective  Patient states that she was squatting down and leaning forward gardening and she was unsteady without lightheadedness or dizziness when she stood up and started to walk. Patient states quick turns will make her feel wobbly and bending  over.    Pertinent History  Patient reports that her imbalance/dizziness problems began last year. Patient denies vertigo. Patient reports that she also began to have issued with double vision which began in July 2019. 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 Patient report she has been wearing a prism applique on the left lens of her eyeglasses which has corrected her double vision. Patient reports she is going to get new lens with the prism embedded in the lens. Patient being [facility] by the [facility] and [facility]. Patient reports she thought at first that her dizziness/imbalance symptoms were related to the Abducens nerve palsy. Patient describes her dizziness as imbalance and denies vertigo. Patient reports she gets imbalance symptoms daily multiple times each day. Patient reports she feels off-balance, staggers and veers when she walks. Patient reports her "eyes feel funny" at times but denies oscillopsia. Patient reports that head turns, quick movements, bending over, looking up and quick turns all bring on her symptoms and are aggravating factors and reports that holding onto something helps ease her symptoms. Patient denies falls. Patient states she is able to  do her chores but states she moves slower on the steps because she is afraid of falling. Patient reports her laundry is in the basement and she has 7 steps to enter her home. Patient reports she went to Dr. Kathyrn Sheriff at [facility] and had VNG testing on 08/14/2018 which revealed 34% left [facility] per medical record.    Diagnostic tests  VNG testing revealed L UVH per MR    Patient Stated Goals  to decrease unsteadiness symptoms       Neuromuscular Re-education:  Dynadisc Activity:  Patient stood with one foot on Airex balance pad  and other foot on green Dynadisc and worked on static stance with clock arm reaching with head follows with CGA/Min A. Repeated with placing one foot on purple Dynadisc with CGA. Patient had more difficulty with the green disc which had more air as compared to the purple.   Obstacle Course: Patient performed obstacle course multiple reps of forward walking and then sidestepping left and then right which included the following activities: navigating onto Airex pad, semi-tandem or sidestepping on Airex balance beam and wooden balance beams, stepping onto and over 6" wooden step, stepping over foam bricks, stepping onto and off green Dynadisc pad with assistance ranging from [place] to [place].   Block Sorting Activity: On firm surface, performed transferring multicolored alphabet blocks from low mat table to turning 180 degrees then reaching up to place block above her head while patient visually tracks the ball so that patient is required to turn 180 degrees Left and Right to turn with CGA. Repeated this activity with patient standing on Airex pad with assistance required being CGA to Min A secondary to one loss of balance but the majority patient was able to self-correct using good ankle and hip strategies.  Pt with evidence of mild imbalance that patient was able to self-correct.  Patient making good progress with therapy services. She is consistently demonstrating good use of ankle and hip strategies with high level balance activities. Patient able to do high level balance obstacle course this date and improved with each trial. She was most challenged by stepping onto/off green Dynadisc. Patient reports that she is doing better but notices that she still feels wobbly when she does quick turns and with bending forward and coming back up. Will plan on practicing these activities next session and then consider doing repeat functional outcome measures in subsequent session. Encouraged patient to continue to perform  HEP and to follow-up as indicated.    PT Education - 01/17/19 0900    Education Details  discussed plan of care and what activities are still causing her to feel imbalance so can work on addressing those items    Person(s) Educated  Patient    Methods  Explanation;Demonstration    Comprehension  Verbalized understanding;Returned demonstration       PT Short Term Goals - 01/11/19 1033      PT SHORT TERM GOAL #1   Title  Patient will be able to perform home program independently for self-management.    Time  4    Period  Weeks    Status  Achieved    Target Date  01/11/19        PT Long Term Goals - 01/11/19 1034      PT LONG TERM GOAL #1   Title  Patient will report 50% or greater improvement in her symptoms of dizziness and imbalance with provoking motions or positions.    Time  8  Period  Weeks    Status  On-going      PT LONG TERM GOAL #2   Title  Patient will be able to ambulate with head turns and quick turns without evidence of imbalance in order to reduce patient's falls risk.    Time  8    Period  Weeks    Status  On-going            Plan - 01/17/19 0945    Clinical Impression Statement  Patient making good progress with therapy services. She is consistently demonstrating good use of ankle and hip strategies with high level balance activities. Patient able to do high level balance obstacle course this date and improved with each trial. She was most challenged by stepping onto/off green Dynadisc. Patient reports that she is doing better but notices that she still feels wobbly when she does quick turns and with bending forward and coming back up. Will plan on practicing these activities next session and then consider doing repeat functional outcome measures in subsequent session. Encouraged patient to continue to perform HEP and to follow-up as indicated.    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, left and episodic paroxysmal anxiety disorder    Examination-Activity Limitations  Stairs    Stability/Clinical Decision Making  Stable/Uncomplicated    Rehab Potential  Excellent    PT Frequency  1x / week    PT Duration  8 weeks    PT Treatment/Interventions  Canalith Repostioning;Stair training;Gait training;Therapeutic exercise;Therapeutic activities;Balance training;Neuromuscular re-education;Vestibular;Patient/family education    PT Next Visit Plan  work on forward bending and then reaching up activities, squating with forward bend and return upright and quick turns    PT Home Exercise Plan  VOR X1 in sitting, 1 minute reps, ST and FT on firm with hori and vert head turns    Consulted and Agree with Plan of Care  Patient       Patient will benefit from skilled therapeutic intervention in order to improve the following deficits and impairments:  Decreased balance, Dizziness, Difficulty walking  Visit Diagnosis: Dizziness and giddiness     Problem List Patient Active Problem List   Diagnosis Date Noted  . Sixth (abducent) nerve palsy, 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  . Abscess of finger of right hand   . Abscess of left forearm   . Cellulitis 10/21/2017  . 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 Personal history of colonic polyps   . Benign neoplasm of cecum   . Benign neoplasm of transverse colon   . Benign neoplasm of ascending colon   . Malignant neoplasm of upper-outer quadrant of left breast in female, estrogen receptor positive ([place]) 01/10/2017  . Neuropathy 10/31/2016  . Taking medication for chronic disease 10/31/2016  . Primary osteoarthritis of right hip 10/31/2016  . 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[place]) 10/19/2015  . Menopause 10/19/2015  . Hormone replacement therapy (HRT) 10/19/2015  . 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 Routine general medical examination at a health care facility 10/10/2014  . 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 Colon polyp 10/10/2014  . Episodic paroxysmal anxiety disorder 10/10/2014    Murphy,Dorriea 01/17/2019, 10:06 AM  [facility] [address] [place], [place], [address] Phone: [phone]   Fax:  [phone]  Name: Aloura Matsuoka MRN: [id number] Date of Birth: February 24, 1946

## 2019-01-18 ENCOUNTER — Encounter: Payer: Medicare Other | Admitting: Physical Therapy

## 2019-01-25 ENCOUNTER — Ambulatory Visit: Payer: Medicare Other | Admitting: Physical Therapy

## 2019-01-25 ENCOUNTER — Other Ambulatory Visit: Payer: Self-pay

## 2019-01-25 ENCOUNTER — Encounter: Payer: Self-pay | Admitting: Physical Therapy

## 2019-01-25 DIAGNOSIS — R42 Dizziness and giddiness: Secondary | ICD-10-CM

## 2019-01-25 NOTE — Therapy (Signed)
[facility] [address] [place], [place], [address] Phone: [phone]   Fax:  [phone]  Physical Therapy Treatment  Patient Details  Name: Melissa Daniels MRN: [id number] Date of Birth: 1945-08-12 Referring Provider (PT): Dr. Kathyrn Sheriff   Encounter Date: 01/25/2019  PT End of Session - 01/25/19 0801    Visit Number  6    Number of Visits  9    Date for PT Re-Evaluation  02/08/19    Authorization Type  4/10 progress note    PT Start Time  0758    PT Stop Time  0836    PT Time Calculation (min)  38 min    Equipment Utilized During Treatment  Gait belt    Activity Tolerance  Patient tolerated treatment well    Behavior During Therapy  [facility] for tasks assessed/performed       Past Medical History:  Diagnosis Date  . Anxiety   . Arthritis    hands  . Double vision   . Hypercholesteremia   . ITP (idiopathic thrombocytopenic purpura)   . Malignant neoplasm of upper-outer quadrant of left breast in female, estrogen receptor positive ([place]) 01/10/2017  . Neuropathy    bilateral feet  . Osteoporosis   . Vertigo    several episodes per year    Past Surgical History:  Procedure Laterality Date  . BREAST BIOPSY Left 12/28/2016   stereo path pend  . BREAST LUMPECTOMY    . COLONOSCOPY WITH PROPOFOL N/A 09/08/2017   Procedure: COLONOSCOPY WITH PROPOFOL;  Surgeon: Lucilla Lame, MD;  Location: [place];  Service: Endoscopy;  Laterality: N/A;  . POLYPECTOMY  09/08/2017   Procedure: POLYPECTOMY;  Surgeon: Lucilla Lame, MD;  Location: [place];  Service: Endoscopy;;  . SPLENECTOMY, TOTAL      There were no vitals filed for this visit.  Subjective Assessment - 01/25/19 0800    Subjective  Patient reports she did a little gardening and states she did well but reports she did not have to do any deep squats. Patient reports things are a tad better.    Pertinent History  Patient reports that her imbalance/dizziness  problems began last year. Patient denies vertigo. Patient reports that she also began to have issued with double vision which began in July 2019. 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 Patient report she has been wearing a prism applique on the left lens of her eyeglasses which has corrected her double vision. Patient reports she is going to get new lens with the prism embedded in the lens. [facility]. Patient reports she thought at first that her dizziness/imbalance symptoms were related to the Abducens nerve palsy. Patient describes her dizziness as imbalance and denies vertigo. Patient reports she gets imbalance symptoms daily multiple times each day. Patient reports she feels off-balance, staggers and veers when she walks. Patient reports her "eyes feel funny" at times but denies oscillopsia. Patient reports that head turns, quick movements, bending over, looking up and quick turns all bring on her symptoms and are aggravating factors and reports that holding onto something helps ease her symptoms. Patient denies falls. Patient states she is able to do her chores but states she moves slower on  the steps because she is afraid of falling. Patient reports her laundry is in the basement and she has 7 steps to enter her home. Patient reports she went to Dr. Kathyrn Sheriff at [facility] and had VNG testing on 08/14/2018 which revealed 34% left [facility] per medical record.    Diagnostic tests  VNG testing revealed L UVH per MR    Patient Stated Goals  to decrease unsteadiness symptoms       Neuromuscular Re-education:  Squatting Activity: Patient had reported that she continues to experience her symptoms of imbalance when gardening as she states she does deep  squats with leaning forward and then standing back up and that this causes her to feel imbalanced.  Patient performed deep squat standing on firm surface and then repeated standing on Airex pad, picking up small items placed 4" off the floor squatting forward for 1-3 minutes at a time and then standing back upright to mimic the movements that she does at home gardening. Patient more challenged by standing on Airex pad.  Discussed some strategies with patient such as getting a small lightweight folding sling bench to sit or a rolling gardening bench because at present she stands with knees fairly straight and forward flexes at the waist to bend over to reach items on the ground. Discussed how patient's current technique puts a lot of strain on the lower back. In addition, talked about when she goes to return upright from a squat trying to widen her base of support a little bit and then placing her hands on her thighs and trying to more slowly walk her hands up her legs until she reaches upright as she might be experiencing a lowered blood pressure with quickly moving from deep squat to standing. Patient states she will try these techniques.   Dynadisc Activity:  Patient stood with purple Dynadisc under one foot and green Dynadisc under other foot while doing alternating step taps to 6" wooden step; repeated activity switching the Dynadisc under the feet and repeated again trying to perform slow controlled alternating step taps.  Then, repeated standing on Dynadiscs while performing mini-squats with 3 second holds 15 reps. Pt required CGA with this activity and pt improved with practice. These activities were challenging for the patient, but she demonstrated very good hip and ankle strategies and was able to self-correct 90% or more of any losses of balance.   PT Education - 01/25/19 0911    Education Details  Discussed some strategies with patient such as getting a small lightweight folding sling bench to  sit or a rolling gardening bench because at present she stands with knees fairly straight and forward flexes at the waist to bend over to reach items on the ground. Discussed how patient's current technique puts a lot of strain on the lower back. In addition, talked about when she goes to return upright from a squat trying to widen her base of support a little bit and then placing her hands on her thighs and trying to more slowly walk her hands up her legs until she reaches upright as she might be experiencing a lowered blood pressure with quickly moving from deep squat to standing. Patient states she will try these techniques.    Person(s) Educated  Patient    Methods  Explanation;Demonstration    Comprehension  Verbalized understanding;Returned demonstration      Airex balance beam: On Airex balance beam, performed walking across the beam stepping off performing alternating quick turns at each end  of the beam.  Then, repeated with having an Airex pad placed at each end of the balance beam with patient walking across beam, stepping onto the Airex pad and performing a quick turn on the pads doing alternating quick turns. Patient reports this is challenging to her balance.  Performed again removing the balance beam and just walking between the two Airex pads while doing alternating quick turns, but patient reports this did not recreate her symptoms of imbalance.     PT Short Term Goals - 01/11/19 1033      PT SHORT TERM GOAL #1   Title  Patient will be able to perform home program independently for self-management.    Time  4    Period  Weeks    Status  Achieved    Target Date  01/11/19        PT Long Term Goals - 01/11/19 1034      PT LONG TERM GOAL #1   Title  Patient will report 50% or greater improvement in her symptoms of dizziness and imbalance with provoking motions or positions.    Time  8    Period  Weeks    Status  On-going      PT LONG TERM GOAL #2   Title  Patient will be  able to ambulate with head turns and quick turns without evidence of imbalance in order to reduce patient's falls risk.    Time  8    Period  Weeks    Status  On-going            Plan - 01/25/19 0801    Clinical Impression Statement  Discussed with patient the activities that are recreating her symptoms of imbalance at home and then tried to reproduce those movements in the clinic. Patient reports the deep squatting standing on Airex pad and the walking across Airex balance pad with doing alteranting quick turns on Airex pads both recreated her symptoms of imbalance. Patient required CGA and did have several losses of balance but she was able to self-correct using ankle and hip strategies the majority of the time and only reached for // bar a few times to regain balance. Patient progressing well and able to perform varied high level balance activities. Will plan on repeating functional outcome measures next session.    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, left and episodic paroxysmal anxiety disorder    Examination-Activity Limitations  Stairs    Stability/Clinical Decision Making  Stable/Uncomplicated    Rehab Potential  Excellent    PT Frequency  1x / week    PT Duration  8 weeks    PT Treatment/Interventions  Canalith Repostioning;Stair training;Gait training;Therapeutic exercise;Therapeutic activities;Balance training;Neuromuscular re-education;Vestibular;Patient/family education    PT Next Visit Plan  work on forward bending and then reaching up activities, squating with forward bend and return upright and quick turns    PT Home Exercise Plan  VOR X1 in sitting, 1 minute reps, ST and FT on firm with hori and vert head turns    Consulted and Agree with Plan of Care  Patient       Patient will benefit from skilled therapeutic intervention in order to improve the following deficits and impairments:  Decreased  balance, Dizziness, Difficulty walking  Visit Diagnosis: Dizziness and giddiness     Problem List Patient Active Problem List   Diagnosis Date Noted  . Sixth (abducent) nerve palsy, left  eye 03/26/2018  . Abscess of finger of right hand   . Abscess of left forearm   . Cellulitis 10/21/2017  . 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 Personal history of colonic polyps   . Benign neoplasm of cecum   . Benign neoplasm of transverse colon   . Benign neoplasm of ascending colon   . Malignant neoplasm of upper-outer quadrant of left breast in female, estrogen receptor positive ([place]) 01/10/2017  . Neuropathy 10/31/2016  . Taking medication for chronic disease 10/31/2016  . Primary osteoarthritis of right hip 10/31/2016  . 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[place]) 10/19/2015  . Menopause 10/19/2015  . Hormone replacement therapy (HRT) 10/19/2015  . 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 Routine general medical examination at a health care facility 10/10/2014  . 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 Female stress incontinence 10/10/2014  . Colon polyp 10/10/2014  . Episodic paroxysmal anxiety disorder 10/10/2014   Lady Deutscher PT, DPT [phone] Lady Deutscher 01/25/2019, 9:18 AM  [facility] [address] [place], [place], [address] Phone: [phone]   Fax:  [phone]  Name: Atisha Windon MRN: [id number] Date of Birth: [birth year]

## 2019-02-01 ENCOUNTER — Ambulatory Visit: Payer: Medicare Other | Attending: Otolaryngology | Admitting: Physical Therapy

## 2019-02-01 ENCOUNTER — Other Ambulatory Visit: Payer: Self-pay

## 2019-02-01 ENCOUNTER — Encounter: Payer: Self-pay | Admitting: Physical Therapy

## 2019-02-01 DIAGNOSIS — R42 Dizziness and giddiness: Secondary | ICD-10-CM | POA: Insufficient documentation

## 2019-02-01 NOTE — Therapy (Signed)
[facility] [address]. [place], [place], [address] Phone: [phone]   Fax:  [phone]  Physical Therapy Treatment  Patient Details  Name: Melissa Daniels MRN: [id number] Date of Birth: [birth year] Referring Provider (PT): Dr. Kathyrn Sheriff   Encounter Date: 02/01/2019  PT End of Session - 02/01/19 0758    Visit Number  7    Number of Visits  9    Date for PT Re-Evaluation  02/08/19    Authorization Type  7/10 progress note    PT Start Time  0757    PT Stop Time  0845    PT Time Calculation (min)  48 min    Equipment Utilized During Treatment  Gait belt    Activity Tolerance  Patient tolerated treatment well    Behavior During Therapy  [facility] for tasks assessed/performed       Past Medical History:  Diagnosis Date  . Anxiety   . Arthritis    hands  . Double vision   . Hypercholesteremia   . ITP (idiopathic thrombocytopenic purpura)   . Malignant neoplasm of upper-outer quadrant of left breast in female, estrogen receptor positive ([place]) 01/10/2017  . Neuropathy    bilateral feet  . Osteoporosis   . Vertigo    several episodes per year    Past Surgical History:  Procedure Laterality Date  . BREAST BIOPSY Left 12/28/2016   stereo path pend  . BREAST LUMPECTOMY    . COLONOSCOPY WITH PROPOFOL N/A 09/08/2017   Procedure: COLONOSCOPY WITH PROPOFOL;  Surgeon: Lucilla Lame, MD;  Location: [place];  Service: Endoscopy;  Laterality: N/A;  . POLYPECTOMY  09/08/2017   Procedure: POLYPECTOMY;  Surgeon: Lucilla Lame, MD;  Location: [place];  Service: Endoscopy;;  . SPLENECTOMY, TOTAL      There were no vitals filed for this visit.  Subjective Assessment - 02/01/19 0757    Subjective  Patient reports that she was bending down to pull weeds yesterday morning and she fell over into the bushes. Patient reports that turning around quickly continues to make her dizzy.    Pertinent History  Patient reports that her  imbalance/dizziness problems began last year. Patient denies vertigo. Patient reports that she also began to have issued with double vision which began in July 2019. 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 Patient report she has been wearing a prism applique on the left lens of her eyeglasses which has corrected her double vision. Patient reports she is going to get new lens with the prism embedded in the lens. Patient being followed by the [facility] and [facility]. Patient reports she thought at first that her dizziness/imbalance symptoms were related to the Abducens nerve palsy. Patient describes her dizziness as imbalance and denies vertigo. Patient reports she gets imbalance symptoms daily multiple times each day. Patient reports she feels off-balance, staggers and veers when she walks. Patient reports her "eyes feel funny" at times but denies oscillopsia. Patient reports that head turns, quick movements, bending over, looking up and quick turns all bring on her symptoms and are aggravating factors and reports that holding onto something helps ease her symptoms. Patient denies falls. Patient states she is able to do her chores but states she moves slower on  the steps because she is afraid of falling. Patient reports her laundry is in the basement and she has 7 steps to enter her home. Patient reports she went to Dr. Kathyrn Sheriff at [facility] and had VNG testing on 08/14/2018 which revealed 34% left [facility] per medical record.    Diagnostic tests  VNG testing revealed L UVH per MR    Patient Stated Goals  to decrease unsteadiness symptoms       Neuromuscular Re-education:  Obstacle Course:  Patient performed obstacle course multiple reps which included the following activities: navigating onto  and over Airex balance pad, purple Dynadisc and 6" wooden step and forward walking and then sidestepping along Airex balance beam while bending down to pick up cones along the obstacle course and turning to hand to therapist with CGA.  Patient did have several small losses of balance with bending and amb on Aires balance beams.  Quick Turns:  Patient performed multiple reps walking with alternating quick turns left and right as called out by therapist. Patient with imbalance noted with quick turns especially right turns as compared to left turns. Patient demonstrated loss of balance with forward flexed trunk posture after turning quickly to her right but she was able to use hip strategies to recover her balance with CGA. Patient would pause at times after quick turn in order to regain her balance before continuing walking.  Patient without imbalance with walking with head turns  FUNCTIONAL OUTCOME MEASURES:  Results Comments  ABC Scale 83.7% Safe for community mobility  DGI 24/24 Safe for community mobility       PT Education - 02/01/19 0758    Education Details  Discussed progress towards goals and functional outcome testing and compared to prior testing. discussed plan of care    Person(s) Educated  Patient    Methods  Explanation    Comprehension  Verbalized understanding       PT Short Term Goals - 01/11/19 1033      PT SHORT TERM GOAL #1   Title  Patient will be able to perform home program independently for self-management.    Time  4    Period  Weeks    Status  Achieved    Target Date  01/11/19        PT Long Term Goals - 02/01/19 0806      PT LONG TERM GOAL #1   Title  Patient will report 50% or greater improvement in her symptoms of dizziness and imbalance with provoking motions or positions.    Baseline  patient reports 90% especially with walking with head turns. still having some difficulty with walking with body turns    Time  8    Period  Weeks    Status   Achieved      PT LONG TERM GOAL #2   Title  Patient will be able to ambulate with head turns and quick turns without evidence of imbalance in order to reduce patient's falls risk.    Baseline  patient with imbalance noted with quick turns especially right turns as compared to left turns. patient without imbalance with walking with head turns    Time  8    Period  Weeks    Status  Partially Met            Plan - 02/01/19 0758    Clinical Impression Statement  Patient reports that she had lost her balance while bending over to do weeding yesterday and fell into  a bush. Patient states that her symptoms overall are 90% better since starting vestibular therapy, but patient reports that she does continue to have difficulty with bending over and with turning around quickly when she walks. Patient noted to have imbalance with quick turns especially turning to the right as compared to the left. Patient met 2 out of 3 and partially met remaining goal as set on plan of care. Patient would benefit from continued PT services to further address difficulties the patient is having with bending and quick turns to try to prevent falls and improve patient's balance.    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, left and episodic paroxysmal anxiety disorder    Examination-Activity Limitations  Stairs    Stability/Clinical Decision Making  Stable/Uncomplicated    Rehab Potential  Excellent    PT Frequency  1x / week    PT Duration  8 weeks    PT Treatment/Interventions  Canalith Repostioning;Stair training;Gait training;Therapeutic exercise;Therapeutic activities;Balance training;Neuromuscular re-education;Vestibular;Patient/family education    PT Next Visit Plan  work on forward bending and then reaching up activities, squating with forward bend and return upright and quick turns    PT Home Exercise Plan  VOR X1 in sitting, 1 minute reps,  ST and FT on firm with hori and vert head turns    Consulted and Agree with Plan of Care  Patient       Patient will benefit from skilled therapeutic intervention in order to improve the following deficits and impairments:  Decreased balance, Dizziness, Difficulty walking  Visit Diagnosis: Dizziness and giddiness     Problem List Patient Active Problem List   Diagnosis Date Noted  . Sixth (abducent) nerve palsy, 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  . Abscess of finger of right hand   . Abscess of left forearm   . Cellulitis 10/21/2017  . 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 Personal history of colonic polyps   . Benign neoplasm of cecum   . Benign neoplasm of transverse colon   . Benign neoplasm of ascending colon   . Malignant neoplasm of upper-outer quadrant of left breast in female, estrogen receptor positive ([place]) 01/10/2017  . Neuropathy 10/31/2016  . Taking medication for chronic disease 10/31/2016  . Primary osteoarthritis of right hip 10/31/2016  . 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[place]) 10/19/2015  . Menopause 10/19/2015  . Hormone replacement therapy (HRT) 10/19/2015  . 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 Routine general medical examination at a health care facility 10/10/2014  . 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 Female stress incontinence 10/10/2014  . Colon polyp 10/10/2014  . Episodic paroxysmal anxiety disorder 10/10/2014   Lady Deutscher PT, DPT [phone] Lady Deutscher 02/01/2019, 1:00 PM  [facility] [address] [place], [place], [address] Phone: [phone]   Fax:  [phone]  Name: Analicia Skibinski MRN: [id number] Date of Birth: [birth year]

## 2019-02-08 ENCOUNTER — Other Ambulatory Visit: Payer: Self-pay

## 2019-02-08 ENCOUNTER — Encounter: Payer: Self-pay | Admitting: Physical Therapy

## 2019-02-08 ENCOUNTER — Ambulatory Visit: Payer: Medicare Other | Admitting: Physical Therapy

## 2019-02-08 DIAGNOSIS — R42 Dizziness and giddiness: Secondary | ICD-10-CM | POA: Diagnosis not present

## 2019-02-08 NOTE — Therapy (Signed)
[place] [facility] [address] [place], [place], [address] Phone: [phone]   Fax:  [phone]  Physical Therapy Treatment/Recertification  Date of service: 12/14/2018-02/08/2019  Patient Details  Name: Melissa Daniels MRN: [id number] Date of Birth: [birth year] Referring Provider (PT): Dr. Kathyrn Sheriff   Encounter Date: 02/08/2019  PT End of Session - 02/08/19 0804    Visit Number  8    Number of Visits  13    Date for PT Re-Evaluation  03/08/19    Authorization Type  8/10 progress note    PT Start Time  0801    PT Stop Time  0844    PT Time Calculation (min)  43 min    Equipment Utilized During Treatment  Gait belt    Activity Tolerance  Patient tolerated treatment well    Behavior During Therapy  [facility] for tasks assessed/performed       Past Medical History:  Diagnosis Date  . Anxiety   . Arthritis    hands  . Double vision   . Hypercholesteremia   . ITP (idiopathic thrombocytopenic purpura)   . Malignant neoplasm of upper-outer quadrant of left breast in female, estrogen receptor positive ([place]) 01/10/2017  . Neuropathy    bilateral feet  . Osteoporosis   . Vertigo    several episodes per year    Past Surgical History:  Procedure Laterality Date  . BREAST BIOPSY Left 12/28/2016   stereo path pend  . BREAST LUMPECTOMY    . COLONOSCOPY WITH PROPOFOL N/A 09/08/2017   Procedure: COLONOSCOPY WITH PROPOFOL;  Surgeon: Lucilla Lame, MD;  Location: [place];  Service: Endoscopy;  Laterality: N/A;  . POLYPECTOMY  09/08/2017   Procedure: POLYPECTOMY;  Surgeon: Lucilla Lame, MD;  Location: [place];  Service: Endoscopy;;  . SPLENECTOMY, TOTAL      There were no vitals filed for this visit.  Subjective Assessment - 02/08/19 0803    Subjective  Patient reports turning still bothers her.    Pertinent History  Patient reports that her imbalance/dizziness problems began last year. Patient denies vertigo. Patient reports  that she also began to have issued with double vision which began in July 2019. 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 Patient report she has been wearing a prism applique on the left lens of her eyeglasses which has corrected her double vision. Patient reports she is going to get new lens with the prism embedded in the lens. Patient being followed by the [facility] and [facility]. Patient reports she thought at first that her dizziness/imbalance symptoms were related to the Abducens nerve palsy. Patient describes her dizziness as imbalance and denies vertigo. Patient reports she gets imbalance symptoms daily multiple times each day. Patient reports she feels off-balance, staggers and veers when she walks. Patient reports her "eyes feel funny" at times but denies oscillopsia. Patient reports that head turns, quick movements, bending over, looking up and quick turns all bring on her symptoms and are aggravating factors and reports that holding onto something helps ease her symptoms. Patient denies falls. Patient states she is able to do her chores but states she moves slower on the steps because she is afraid of falling. Patient reports her laundry is in the basement and she has  7 steps to enter her home. Patient reports she went to Dr. Kathyrn Sheriff at [facility] and had VNG testing on 08/14/2018 which revealed 34% left [facility] per medical record.    Diagnostic tests  VNG testing revealed L UVH per MR    Patient Stated Goals  to decrease unsteadiness symptoms      Patient states that she feels she is doing better, but that she still has some difficulty with turning while walking and with bending over. Patient states she would like to do a few more sessions to work on these activities.     Neuromuscular Re-education:  Stairs: Patient reports that she has about 10 steps with left rail and landing and then 8 steps without rail to go from her basement where her laundry room is to the first floor and then there is a door at the top of the steps that she needs to open inward to access the first floor. Patient reports that she folds her clothes over her arm or uses a wicker basket for her laundry. Discussed strategies and safety with navigating steps with laundry. Patient held a collapsible laundry tote with handles and practiced ascending/descending steps with left rail and simulated opening the door inward at the top of the steps.   Sweeping with weights: Practiced placing small weights on the floor and having patient use a swiffer to turn side to side while pushing the weights across floor.   Body Wall Rolls:  Patient performed 4 reps of unsupported, body wall rolls with eyes open and repeated with eyes closed with CGA. Worked on single reps with eyes open and then eyes closed, but concentrated on increasing turning speed with CGA. Patient had sway and utilized ankle strategies and was able to self-correct each time.   Diona Foley tosses and passes: Patient performed multiple 52' trials of forward ambulation while doing random ball passes and bounces to the center, left and right sides as passed to patient by a second person and then added in random quick turns.  Uneven surfaces outside: Patient practiced walking outside on uneven, grassy surface with random quick turns and randomly bending over to pick objects off the ground then standing up and walking again with random quick turns and navigating up/down 6" curbs. Patient would step down off the curb and do a random quick turn and then step back up the curb onto the grass and continue walking. Patient had a few small losses of balance where she would sway and use ankle strategies to recover.   Will plan on working on navigating outside  on uneven surfaces, a variety of surfaces while doing random quick turns and bends and repeated unsupported body wall rolls while working on increasing turning speed.    PT Education - 02/08/19 1238    Education Details  Discussed plan of care    Person(s) Educated  Patient    Methods  Explanation    Comprehension  Verbalized understanding         PT Short Term Goals - 01/11/19 1033      PT SHORT TERM GOAL #1   Title  Patient will be able to perform home program independently for self-management.    Time  4    Period  Weeks    Status  Achieved    Target Date  01/11/19        PT Long Term Goals - 02/08/19 1248      PT LONG TERM GOAL #1   Title  Patient  will report 50% or greater improvement in her symptoms of dizziness and imbalance with provoking motions or positions.    Baseline  patient reports 90% especially with walking with head turns. still having some difficulty with walking with body turns    Time  8    Period  Weeks    Status  Achieved      PT LONG TERM GOAL #2   Title  Patient will be able to ambulate with head turns and quick turns without evidence of imbalance in order to reduce patient's falls risk.    Baseline  patient with imbalance noted with quick turns especially right turns as compared to left turns. patient without imbalance with walking with head turns    Time  8    Period  Weeks    Status  Partially Met      PT LONG TERM GOAL #3   Title  Patient will be able to ambulate outdoors on grass, sidewalks and up/down curbs without loss of balance in order to be able to perform gardening and outdoor activities safely.            Plan - 02/08/19 1241    Clinical Impression Statement  Last session, repeated functional outcome measures and patient met 2 out of 3 and partially met remaining goal as set on plan of care. Patient has made good progress with therapy and reports her symptoms overall are 90% better since starting therapy. Patient reports that  she does continue to have difficulty at times with bending over and with turning quickly. Worked on quick turns and patient does have brief imbalance with right turns. Patient does a lot of gardening and outdoor activities and she would benefit from a few sessions to work on higher level balance activites focusing on navigating outdoors, quick turns and bending activities. Added an outdoor ambulation goal to plan of care.    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, left and episodic paroxysmal anxiety disorder    Examination-Activity Limitations  Stairs    Stability/Clinical Decision Making  Stable/Uncomplicated    Rehab Potential  Excellent    PT Frequency  1x / week    PT Duration  8 weeks    PT Treatment/Interventions  Canalith Repostioning;Stair training;Gait training;Therapeutic exercise;Therapeutic activities;Balance training;Neuromuscular re-education;Vestibular;Patient/family education    PT Next Visit Plan  work on forward bending and then reaching up activities, squating with forward bend and return upright and quick turns    PT Home Exercise Plan  VOR X1 in sitting, 1 minute reps, ST and FT on firm with hori and vert head turns    Consulted and Agree with Plan of Care  Patient       Patient will benefit from skilled therapeutic intervention in order to improve the following deficits and impairments:  Decreased balance, Dizziness, Difficulty walking  Visit Diagnosis: Dizziness and giddiness     Problem List Patient Active Problem List   Diagnosis Date Noted  . Sixth (abducent) nerve palsy, 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  . Abscess of finger of right hand   . Abscess of left forearm   . Cellulitis 10/21/2017  . 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 Personal history of colonic polyps   . Benign neoplasm of cecum   . Benign neoplasm of transverse colon   . Benign neoplasm of ascending colon   . Malignant neoplasm of upper-outer  quadrant of left breast in female, estrogen receptor positive ([place])  01/10/2017  . Neuropathy 10/31/2016  . Taking medication for chronic disease 10/31/2016  . Primary osteoarthritis of right hip 10/31/2016  . 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[place]) 10/19/2015  . Menopause 10/19/2015  . Hormone replacement therapy (HRT) 10/19/2015  . 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 Routine general medical examination at a health care facility 10/10/2014  . 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 Female stress incontinence 10/10/2014  . Colon polyp 10/10/2014  . Episodic paroxysmal anxiety disorder 10/10/2014   Lady Deutscher PT, DPT [phone] Lady Deutscher 02/08/2019, 1:36 PM   [facility] [address] [place], [place], [address] Phone: [phone]   Fax:  [phone]  Name: Dashley Monts MRN: [id number] Date of Birth: 1945-10-11

## 2019-02-15 ENCOUNTER — Encounter: Payer: Self-pay | Admitting: Physical Therapy

## 2019-02-15 ENCOUNTER — Other Ambulatory Visit: Payer: Self-pay

## 2019-02-15 ENCOUNTER — Ambulatory Visit: Payer: Medicare Other | Admitting: Physical Therapy

## 2019-02-15 DIAGNOSIS — R42 Dizziness and giddiness: Secondary | ICD-10-CM | POA: Diagnosis not present

## 2019-02-15 NOTE — Therapy (Signed)
[place] [facility] [address] [place], [place], [address] Phone: [phone]   Fax:  [phone]  Physical Therapy Treatment  Patient Details  Name: Melissa Daniels MRN: [id number] Date of Birth: 1945-10-27 Referring Provider (PT): Dr. Kathyrn Sheriff   Encounter Date: 02/15/2019  PT End of Session - 02/15/19 0759    Visit Number  9    Number of Visits  13    Date for PT Re-Evaluation  03/08/19    Authorization Type  9/10 progress note    PT Start Time  0759    PT Stop Time  0845    PT Time Calculation (min)  46 min    Equipment Utilized During Treatment  Gait belt    Activity Tolerance  Patient tolerated treatment well    Behavior During Therapy  [facility] for tasks assessed/performed       Past Medical History:  Diagnosis Date  . Anxiety   . Arthritis    hands  . Double vision   . Hypercholesteremia   . ITP (idiopathic thrombocytopenic purpura)   . Malignant neoplasm of upper-outer quadrant of left breast in female, estrogen receptor positive ([place]) 01/10/2017  . Neuropathy    bilateral feet  . Osteoporosis   . Vertigo    several episodes per year    Past Surgical History:  Procedure Laterality Date  . BREAST BIOPSY Left 12/28/2016   stereo path pend  . BREAST LUMPECTOMY    . COLONOSCOPY WITH PROPOFOL N/A 09/08/2017   Procedure: COLONOSCOPY WITH PROPOFOL;  Surgeon: Lucilla Lame, MD;  Location: [place];  Service: Endoscopy;  Laterality: N/A;  . POLYPECTOMY  09/08/2017   Procedure: POLYPECTOMY;  Surgeon: Lucilla Lame, MD;  Location: [place];  Service: Endoscopy;;  . SPLENECTOMY, TOTAL      There were no vitals filed for this visit.  Subjective Assessment - 02/15/19 0758    Subjective  Patient states she did fine this past week and states she can see some signs that she has improved.    Pertinent History  Patient reports that her imbalance/dizziness problems began last year. Patient denies vertigo. Patient reports  that she also began to have issued with double vision which began in July 2019. 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 Patient report she has been wearing a prism applique on the left lens of her eyeglasses which has corrected her double vision. Patient reports she is going to get new lens with the prism embedded in the lens. Patient being followed by the [facility] and [facility]. Patient reports she thought at first that her dizziness/imbalance symptoms were related to the Abducens nerve palsy. Patient describes her dizziness as imbalance and denies vertigo. Patient reports she gets imbalance symptoms daily multiple times each day. Patient reports she feels off-balance, staggers and veers when she walks. Patient reports her "eyes feel funny" at times but denies oscillopsia. Patient reports that head turns, quick movements, bending over, looking up and quick turns all bring on her symptoms and are aggravating factors and reports that holding onto something helps ease her symptoms. Patient denies falls. Patient states she is able to do her chores but states she moves slower on the steps because she is afraid of falling. Patient reports her  laundry is in the basement and she has 7 steps to enter her home. Patient reports she went to Dr. Kathyrn Sheriff at [facility] and had VNG testing on 08/14/2018 which revealed 34% left [facility] per medical record.    Diagnostic tests  VNG testing revealed L UVH per MR    Patient Stated Goals  to decrease unsteadiness symptoms        Neuromuscular Re-education:  The weather was rainy and windy therefore did not get to try outdoor navigation on surfaces. Will attempt next visit.   Ambulation with head turns: Patient performed retro ambulation with eyes closed with  patient self-directed random head turns horizontal, vertical and diagonal several small veers where patient was able to self correct using either ankle, hip or stepping strategies with CGA.  Body Wall Rolls:  Patient performed unsupported body wall rolls with eyes closed multiple reps with CGA. Repeated reps working on increased sped of turns.  When patient would complete a roll and stop, patient swayed several times but no overt losses of balance.   Hallway ball toss:  In hallway, worked on ball toss against one wall with alternating quick turns to toss ball against opposite wall while tracking with eyes and head. Repeated while walking down the hallway.   PT Education - 02/15/19 0758    Education Details  discussed progressions of exercises    Person(s) Educated  Patient    Methods  Explanation    Comprehension  Verbalized understanding       PT Short Term Goals - 01/11/19 1033      PT SHORT TERM GOAL #1   Title  Patient will be able to perform home program independently for self-management.    Time  4    Period  Weeks    Status  Achieved    Target Date  01/11/19        PT Long Term Goals - 02/08/19 1248      PT LONG TERM GOAL #1   Title  Patient will report 50% or greater improvement in her symptoms of dizziness and imbalance with provoking motions or positions.    Baseline  patient reports 90% especially with walking with head turns. still having some difficulty with walking with body turns    Time  8    Period  Weeks    Status  Achieved      PT LONG TERM GOAL #2   Title  Patient will be able to ambulate with head turns and quick turns without evidence of imbalance in order to reduce patient's falls risk.    Baseline  patient with imbalance noted with quick turns especially right turns as compared to left turns. patient without imbalance with walking with head turns    Time  8    Period  Weeks    Status  Partially Met      PT LONG TERM GOAL #3   Title  Patient  will be able to ambulate outdoors on grass, sidewalks and up/down curbs without loss of balance in order to be able to perform gardening and outdoor activities safely.            Plan - 02/15/19 0759    Clinical Impression Statement  Originally had planned on practicing walking outdoors on a variety of surfaces but due to weather conditions this was deferred until next session. Worked on several different eyes closed activities, bending over and quick turns. Patient did better this date with being able to bend  over and return upright without any losses of balance. Patient did have several small episodes of veering and swaying with eyes closed and quick turn activities espcially with turns to the right. Will continue to practice quick turns and outside ambulation on uneven surfaces next session. Anticipate discharge from PT services at next session.    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, left and episodic paroxysmal anxiety disorder    Examination-Activity Limitations  Stairs    Stability/Clinical Decision Making  Stable/Uncomplicated    Rehab Potential  Excellent    PT Frequency  1x / week    PT Duration  8 weeks    PT Treatment/Interventions  Canalith Repostioning;Stair training;Gait training;Therapeutic exercise;Therapeutic activities;Balance training;Neuromuscular re-education;Vestibular;Patient/family education    PT Next Visit Plan  work on forward bending and then reaching up activities, squating with forward bend and return upright and quick turns    PT Home Exercise Plan  VOR X1 in sitting, 1 minute reps, ST and FT on firm with hori and vert head turns    Consulted and Agree with Plan of Care  Patient       Patient will benefit from skilled therapeutic intervention in order to improve the following deficits and impairments:  Decreased balance, Dizziness, Difficulty walking  Visit Diagnosis: Dizziness  and giddiness     Problem List Patient Active Problem List   Diagnosis Date Noted  . Sixth (abducent) nerve palsy, 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  . Abscess of finger of right hand   . Abscess of left forearm   . Cellulitis 10/21/2017  . 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 Personal history of colonic polyps   . Benign neoplasm of cecum   . Benign neoplasm of transverse colon   . Benign neoplasm of ascending colon   . Malignant neoplasm of upper-outer quadrant of left breast in female, estrogen receptor positive ([place]) 01/10/2017  . Neuropathy 10/31/2016  . Taking medication for chronic disease 10/31/2016  . Primary osteoarthritis of right hip 10/31/2016  . 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[place]) 10/19/2015  . Menopause 10/19/2015  . Hormone replacement therapy (HRT) 10/19/2015  . 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 Routine general medical examination at a health care facility 10/10/2014  . 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 Female stress incontinence 10/10/2014  . Colon polyp 10/10/2014  . Episodic paroxysmal anxiety disorder 10/10/2014   Lady Deutscher PT, DPT [phone] Lady Deutscher 02/15/2019, 12:11 PM  [place] [facility] [address] [place], [place], [address] Phone: [phone]   Fax:  [phone]  Name: Melissa Daniels MRN: [id number] Date of Birth: May 24, 1946

## 2019-03-01 ENCOUNTER — Ambulatory Visit: Payer: Medicare Other | Attending: Otolaryngology | Admitting: Physical Therapy

## 2019-03-01 ENCOUNTER — Other Ambulatory Visit: Payer: Self-pay

## 2019-03-01 ENCOUNTER — Encounter: Payer: Self-pay | Admitting: Physical Therapy

## 2019-03-01 DIAGNOSIS — R42 Dizziness and giddiness: Secondary | ICD-10-CM | POA: Diagnosis not present

## 2019-03-01 NOTE — Therapy (Signed)
[place] [facility] [address] [place], [place], [address] Phone: [phone]   Fax:  [phone]  Physical Therapy Treatment/Discharge Summary  Dates of Services:  12/14/2018-03/01/2019 Number of Visits: 13  Patient Details  Name: Melissa Daniels MRN: [id number] Date of Birth: February 03, 1946 Referring Provider (PT): Dr. Kathyrn Sheriff   Encounter Date: 03/01/2019  PT End of Session - 03/01/19 0850    Visit Number  10    Number of Visits  13    Date for PT Re-Evaluation  03/08/19    PT Start Time  0759    PT Stop Time  0845    PT Time Calculation (min)  46 min    Equipment Utilized During Treatment  Gait belt    Activity Tolerance  Patient tolerated treatment well    Behavior During Therapy  [facility] for tasks assessed/performed       Past Medical History:  Diagnosis Date  . Anxiety   . Arthritis    hands  . Double vision   . Hypercholesteremia   . ITP (idiopathic thrombocytopenic purpura)   . Malignant neoplasm of upper-outer quadrant of left breast in female, estrogen receptor positive ([place]) 01/10/2017  . Neuropathy    bilateral feet  . Osteoporosis   . Vertigo    several episodes per year    Past Surgical History:  Procedure Laterality Date  . BREAST BIOPSY Left 12/28/2016   stereo path pend  . BREAST LUMPECTOMY    . COLONOSCOPY WITH PROPOFOL N/A 09/08/2017   Procedure: COLONOSCOPY WITH PROPOFOL;  Surgeon: Lucilla Lame, MD;  Location: [place];  Service: Endoscopy;  Laterality: N/A;  . POLYPECTOMY  09/08/2017   Procedure: POLYPECTOMY;  Surgeon: Lucilla Lame, MD;  Location: [place];  Service: Endoscopy;;  . SPLENECTOMY, TOTAL      There were no vitals filed for this visit.  Subjective Assessment - 03/01/19 0759    Subjective  Patient states she has been doing well. Patient asked about some of her home exercises as she has been trying to work on progressions of exercises at home.    Pertinent History  Patient  reports that her imbalance/dizziness problems began last year. Patient denies vertigo. Patient reports that she also began to have issued with double vision which began in July 2019. 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 Patient report she has been wearing a prism applique on the left lens of her eyeglasses which has corrected her double vision. Patient reports she is going to get new lens with the prism embedded in the lens. Patient being followed by the [facility] and [facility]. Patient reports she thought at first that her dizziness/imbalance symptoms were related to the Abducens nerve palsy. Patient describes her dizziness as imbalance and denies vertigo. Patient reports she gets imbalance symptoms daily multiple times each day. Patient reports she feels off-balance, staggers and veers when she walks. Patient reports her "eyes feel funny" at times but denies oscillopsia. Patient reports that head turns, quick movements, bending over, looking up and quick turns all bring on her symptoms and are aggravating factors and reports that holding onto something helps ease her symptoms. Patient denies falls. Patient states she is able to do her chores but states she moves slower on  the steps because she is afraid of falling. Patient reports her laundry is in the basement and she has 7 steps to enter her home. Patient reports she went to Dr. Kathyrn Sheriff at [facility] and had VNG testing on 08/14/2018 which revealed 34% left [facility] per medical record.    Diagnostic tests  VNG testing revealed L UVH per MR    Patient Stated Goals  to decrease unsteadiness symptoms        Neuromuscular Re-education:  Uneven surfaces: Worked on ambulating up/down curbs, sidewalks, grass, slopes and uneven ground outside  this date while performing turns and bending to pick up object activities with CGA. Patient did very well but did have a few small losses of balance/imbalance that she was able to self-correct.   Body Wall Rolls:  Patient performed mulptiple reps of unsupported, body wall rolls with eyes closed in series of one and two complete revolutions while working on increasing turning speed. Repeated activity with stopping between revolutions and scanning for cones placed on floor and bending to retrieve cone.   Ambulation with head turns:  Patient performed 125' trials of forwards and retro ambulation with self-selected random horizontal,diagonal and vertical head turns with CGA.    Diona Foley toss to self:  Patient performed ambulation in hallway while tossing ball to the left and right sides of the wall with return catch with CGA.     PT Education - 03/01/19 0910    Education Details  discussed home exercise program and progressions of exercises; discussed discharge plans    Person(s) Educated  Patient    Methods  Explanation    Comprehension  Verbalized understanding       PT Short Term Goals - 01/11/19 1033      PT SHORT TERM GOAL #1   Title  Patient will be able to perform home program independently for self-management.    Time  4    Period  Weeks    Status  Achieved    Target Date  01/11/19        PT Long Term Goals - 03/01/19 0911      PT LONG TERM GOAL #1   Title  Patient will report 50% or greater improvement in her symptoms of dizziness and imbalance with provoking motions or positions.    Baseline  patient reports 90% especially with walking with head turns. still having some difficulty with walking with body turns    Time  8    Period  Weeks    Status  Achieved      PT LONG TERM GOAL #2   Title  Patient will be able to ambulate with head turns and quick turns without evidence of imbalance in order to reduce patient's falls risk.    Baseline  patient with imbalance noted at  times with quick turns especially right turns as compared to left turns. patient without imbalance with walking with head turns    Time  8    Period  Weeks    Status  Partially Met      PT LONG TERM GOAL #3   Title  Patient will be able to ambulate outdoors on grass, sidewalks and up/down curbs without loss of balance in order to be able to perform gardening and outdoor activities safely.    Baseline  Patient did well this date with navigating on outdoor uneven surfaces, curbs and side walks.    Status  Achieved  Plan - 03/01/19 0851    Clinical Impression Statement  Patient has progressed well with therapy services and she reports 90% improvement in her symptoms overall. Patient has met 1/1 short term goals and met 2/3 long term goals. Patient partially met remaining goal as she is now able to ambulate with head turns without evidence of imbalance, but she does have some mild imbalance with quick turns to the right more so than the left. However, patient is usually able to self-correct for any episodes of imbalance. Patient worked on high level balance activities and she is independent with her home exercise program. Patient plans to continue to perform home exercise program upon discharge and encouraged patient to stay active. Will plan on discharging patient at this time and patient is in agreement.    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, left and episodic paroxysmal anxiety disorder    Examination-Activity Limitations  Stairs    Stability/Clinical Decision Making  Stable/Uncomplicated    Rehab Potential  Excellent    PT Frequency  1x / week    PT Duration  8 weeks    PT Treatment/Interventions  Canalith Repostioning;Stair training;Gait training;Therapeutic exercise;Therapeutic activities;Balance training;Neuromuscular re-education;Vestibular;Patient/family education    PT Next Visit Plan  work on  forward bending and then reaching up activities, squating with forward bend and return upright and quick turns    PT Home Exercise Plan  VOR X1 in sitting, 1 minute reps, ST and FT on firm with hori and vert head turns    Consulted and Agree with Plan of Care  Patient       Patient will benefit from skilled therapeutic intervention in order to improve the following deficits and impairments:  Decreased balance, Dizziness, Difficulty walking  Visit Diagnosis: Dizziness and giddiness     Problem List Patient Active Problem List   Diagnosis Date Noted  . Sixth (abducent) nerve palsy, 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  . Abscess of finger of right hand   . Abscess of left forearm   . Cellulitis 10/21/2017  . 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 Personal history of colonic polyps   . Benign neoplasm of cecum   . Benign neoplasm of transverse colon   . Benign neoplasm of ascending colon   . Malignant neoplasm of upper-outer quadrant of left breast in female, estrogen receptor positive ([place]) 01/10/2017  . Neuropathy 10/31/2016  . Taking medication for chronic disease 10/31/2016  . Primary osteoarthritis of right hip 10/31/2016  . 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[place]) 10/19/2015  . Menopause 10/19/2015  . Hormone replacement therapy (HRT) 10/19/2015  . 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 Routine general medical examination at a health care facility 10/10/2014  . 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 Female stress incontinence 10/10/2014  . Colon polyp 10/10/2014  . Episodic paroxysmal anxiety disorder 10/10/2014   Lady Deutscher PT, DPT [phone] Lady Deutscher 03/01/2019, 2:05 PM  [facility] [address] [place], [place], [address] Phone: [phone]   Fax:  [phone]  Name: Lashante Fryberger MRN: [id number] Date of Birth: [birth year]

## 2019-03-02 ENCOUNTER — Other Ambulatory Visit: Payer: Self-pay | Admitting: Family Medicine

## 2019-03-02 DIAGNOSIS — E7849 Other hyperlipidemia: Secondary | ICD-10-CM

## 2019-03-06 DIAGNOSIS — Z170 Estrogen receptor positive status [ER+]: Secondary | ICD-10-CM | POA: Diagnosis not present

## 2019-03-06 DIAGNOSIS — Z853 Personal history of malignant neoplasm of breast: Secondary | ICD-10-CM | POA: Diagnosis not present

## 2019-03-06 DIAGNOSIS — C50412 Malignant neoplasm of upper-outer quadrant of left female breast: Secondary | ICD-10-CM | POA: Diagnosis not present

## 2019-03-06 LAB — HM MAMMOGRAPHY: HM Mammogram: NORMAL (ref 0–4)

## 2019-03-28 ENCOUNTER — Ambulatory Visit: Payer: Self-pay | Admitting: Family Medicine

## 2019-03-29 ENCOUNTER — Other Ambulatory Visit: Payer: Self-pay | Admitting: Family Medicine

## 2019-03-29 ENCOUNTER — Encounter: Payer: Self-pay | Admitting: Family Medicine

## 2019-03-29 ENCOUNTER — Ambulatory Visit (INDEPENDENT_AMBULATORY_CARE_PROVIDER_SITE_OTHER): Payer: Medicare Other | Admitting: Family Medicine

## 2019-03-29 ENCOUNTER — Ambulatory Visit: Payer: Self-pay | Admitting: Family Medicine

## 2019-03-29 ENCOUNTER — Other Ambulatory Visit: Payer: Self-pay

## 2019-03-29 VITALS — BP 124/80 | HR 72 | Ht 66.0 in | Wt 153.0 lb

## 2019-03-29 DIAGNOSIS — B373 Candidiasis of vulva and vagina: Secondary | ICD-10-CM | POA: Diagnosis not present

## 2019-03-29 DIAGNOSIS — N393 Stress incontinence (female) (male): Secondary | ICD-10-CM

## 2019-03-29 DIAGNOSIS — B3731 Acute candidiasis of vulva and vagina: Secondary | ICD-10-CM

## 2019-03-29 MED ORDER — NYSTATIN 100000 UNIT/GM EX CREA: 1.0000 "application " | TOPICAL_CREAM | Freq: Two times a day (BID) | CUTANEOUS | 5 refills | Status: DC

## 2019-03-29 MED ORDER — FLUCONAZOLE 150 MG PO TABS: 150.0000 mg | ORAL_TABLET | Freq: Once | ORAL | 0 refills | Status: AC

## 2019-03-29 NOTE — Progress Notes (Signed)
Date:  03/29/2019   Name:  Melissa Daniels   DOB:  Jul 07, 1945   MRN:  [id number]   Chief Complaint: Vaginitis (taking monistat otc- helping but not getting rid of it)  Rash This is a chronic problem. The current episode started more than 1 year ago. The problem has been waxing and waning since onset. Location: vulvar area. The rash is characterized by itchiness and redness. She was exposed to nothing (wipes s/p diarrhea). Treatments tried: monostat cream. The treatment provided mild relief.    Review of Systems  Skin: Positive for rash.    Patient Active Problem List   Diagnosis Date Noted  . Sixth (abducent) nerve palsy, 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  . Abscess of finger of right hand   . Abscess of left forearm   . Cellulitis 10/21/2017  . 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 Personal history of colonic polyps   . Benign neoplasm of cecum   . Benign neoplasm of transverse colon   . Benign neoplasm of ascending colon   . Malignant neoplasm of upper-outer quadrant of left breast in female, estrogen receptor positive ([place]) 01/10/2017  . Neuropathy 10/31/2016  . Taking medication for chronic disease 10/31/2016  . Primary osteoarthritis of right hip 10/31/2016  . 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[place]) 10/19/2015  . Menopause 10/19/2015  . Hormone replacement therapy (HRT) 10/19/2015  . 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 Routine general medical examination at a health care facility 10/10/2014  . 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 Female stress incontinence 10/10/2014  . Colon polyp 10/10/2014  . Episodic paroxysmal anxiety disorder 10/10/2014    Allergies  Allergen Reactions  . Aspirin   . Macrobid [Nitrofurantoin]   . 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 Sulfa Antibiotics Other (See Comments)    Platelets drop     Past Surgical History:  Procedure  Laterality Date  . BREAST BIOPSY Left 12/28/2016   stereo path pend  . BREAST LUMPECTOMY    . COLONOSCOPY WITH PROPOFOL N/A 09/08/2017   Procedure: COLONOSCOPY WITH PROPOFOL;  Surgeon: Lucilla Lame, MD;  Location: [place];  Service: Endoscopy;  Laterality: N/A;  . POLYPECTOMY  09/08/2017   Procedure: POLYPECTOMY;  Surgeon: Lucilla Lame, MD;  Location: [place];  Service: Endoscopy;;  . SPLENECTOMY, TOTAL      Social History   Tobacco Use  . Smoking status: Never Smoker  . Smokeless tobacco: Never Used  . Tobacco comment: smoking cessation materials not required  Substance Use Topics  . Alcohol use: No    Alcohol/week: 0.0 standard drinks  . Drug use: No     Medication list has been reviewed and updated.  Current Meds  Medication Sig  . ALPRAZolam (XANAX) 0.25 MG tablet TAKE (1) TABLET BY MOUTH EVERY DAY AS NEEDED  . Calcium Citrate-Vitamin D (CALCIUM + D PO) Take 1 tablet by mouth 2 (two) times daily.  . DULoxetine (CYMBALTA) 60 MG capsule Take by mouth.  . gabapentin (NEURONTIN) 300 MG capsule TAKE 1 CAPSULE BY MOUTH TWICE DAILY (Patient taking differently: Take 300 mg by mouth at bedtime. )  . Magnesium 400 MG CAPS Take 1 capsule by mouth 2 (two) times daily.   . Multiple Vitamins-Minerals (OCUVITE ADULT 50+ PO) Take 1 each by mouth 2 (two) times daily.  Marland Kitchen oxybutynin (DITROPAN-XL) 5 MG 24 hr tablet Take 5 mg by mouth at  bedtime.  . senna (SENOKOT) 8.6 MG tablet Take 1 tablet by mouth as needed for constipation.  . simvastatin (ZOCOR) 20 MG tablet TAKE ONE TABLET BY MOUTH AT BEDTIME  . vitamin E 400 UNIT capsule Take 400 Units by mouth daily.    PHQ 2/9 Scores 03/29/2019 11/27/2018 08/22/2018 08/21/2017  PHQ - 2 Score 0 0 0 0  PHQ- 9 Score 0 1 - 0    BP Readings from Last 3 Encounters:  03/29/19 124/80  11/27/18 120/80  05/07/18 120/62    Physical Exam  Wt Readings from Last 3 Encounters:  03/29/19 153 lb (69.4 kg)  11/27/18 148 lb (67.1 kg)   08/22/18 153 lb (69.4 kg)    BP 124/80   Pulse 72   Ht 5\' 6"  (1.676 m)   Wt 153 lb (69.4 kg)   BMI 24.69 kg/m   Assessment and Plan:  1. Yeast vaginitis Acute.  Persistent.  Patient has noted a persistence of a yeast vaginitis as described as redness.  Patient will be started on a Diflucan 150 mg to take now and may repeat in a week in the between times she may use nystatin cream apply twice a day.  I have discussed with patient the possibility of atrophic vaginitis and the possibility that this may be due to estrogen deficiency. - nystatin cream (MYCOSTATIN); Apply 1 application topically 2 (two) times daily.  Dispense: 30 g; Refill: 5 - fluconazole (DIFLUCAN) 150 MG tablet; Take 1 tablet (150 mg total) by mouth once for 1 dose. Take 1 today /may repeat in 1 week  Dispense: 2 tablet; Refill: 0 - [facility]

## 2019-04-01 ENCOUNTER — Other Ambulatory Visit: Payer: Self-pay

## 2019-04-01 DIAGNOSIS — N393 Stress incontinence (female) (male): Secondary | ICD-10-CM

## 2019-04-01 MED ORDER — OXYBUTYNIN CHLORIDE ER 5 MG PO TB24: 5.0000 mg | ORAL_TABLET | Freq: Every day | ORAL | 1 refills | Status: DC

## 2019-04-02 ENCOUNTER — Telehealth: Payer: Self-pay

## 2019-04-02 ENCOUNTER — Other Ambulatory Visit: Payer: Self-pay

## 2019-04-02 DIAGNOSIS — F410 Panic disorder [episodic paroxysmal anxiety] without agoraphobia: Secondary | ICD-10-CM

## 2019-04-02 MED ORDER — ALPRAZOLAM 0.25 MG PO TABS: ORAL_TABLET | ORAL | 0 refills | Status: DC

## 2019-04-02 NOTE — Telephone Encounter (Signed)
Pt called this am wanting her Alprazolam refilled- I left her a message after sending the med in. It has now been flagged as a high risk due to age. I explained, in detail on her machine, we sent this in for #30 and to use sparingly as this will probably be the last time Dr Ronnald Ramp is able to prescribe this med.

## 2019-04-02 NOTE — Telephone Encounter (Signed)
Pt called wanting her oxybutinin- I called the medicine in at [facility]. She also had asked about her gabapentin and cymbalta- which I reminded her, as well as called and spoke to Wallace at the pharmacy, these meds need to come from dr Pearletha Forge neuro at [facility].

## 2019-04-11 ENCOUNTER — Ambulatory Visit (INDEPENDENT_AMBULATORY_CARE_PROVIDER_SITE_OTHER): Payer: Medicare Other | Admitting: Advanced Practice Midwife

## 2019-04-11 ENCOUNTER — Other Ambulatory Visit: Payer: Self-pay

## 2019-04-11 ENCOUNTER — Other Ambulatory Visit (HOSPITAL_COMMUNITY)
Admission: RE | Admit: 2019-04-11 | Discharge: 2019-04-11 | Disposition: A | Discharge status: Home / Self Care | Payer: Medicare Other | Source: Ambulatory Visit | Attending: Advanced Practice Midwife | Admitting: Advanced Practice Midwife

## 2019-04-11 ENCOUNTER — Encounter: Payer: Self-pay | Admitting: Advanced Practice Midwife

## 2019-04-11 VITALS — BP 119/65 | HR 95 | Ht 66.0 in | Wt 152.0 lb

## 2019-04-11 DIAGNOSIS — N898 Other specified noninflammatory disorders of vagina: Secondary | ICD-10-CM | POA: Diagnosis not present

## 2019-04-11 DIAGNOSIS — B373 Candidiasis of vulva and vagina: Secondary | ICD-10-CM

## 2019-04-11 DIAGNOSIS — B3731 Acute candidiasis of vulva and vagina: Secondary | ICD-10-CM

## 2019-04-11 NOTE — Progress Notes (Signed)
Patient ID: Melissa Daniels, female   DOB: 06-16-1945, 73 y.o.   MRN: [id number]  Reason for Consult: Vaginitis (no discharge, no odor, itching)   Referred by Melissa Patch, MD  Subjective:     HPI:   Melissa Daniels is a 73 y.o. female being seen for vaginal irritation and mild itching. About a month ago she had an episode of diarrhea with some general contact of stool on her vaginal and perineal/perianal area. She started having some irritation and itching. She has tried several medications without relief; monistat, vagisil, nystatin cream and diflucan. She has seen areas of external redness. She wears Depends for bladder leakage and reports they are cotton covered. She denies any odor or discharge. She denies burning with urination. She has had some issues with vaginal dryness over the years since being post menopausal and has not taken hormone replacement. She is not sexually active.   Past Medical History:  Diagnosis Date  . Anxiety   . Arthritis    hands  . Double vision   . Hypercholesteremia   . ITP (idiopathic thrombocytopenic purpura)   . Malignant neoplasm of upper-outer quadrant of left breast in female, estrogen receptor positive ([place]) 01/10/2017  . Neuropathy    bilateral feet  . Osteoporosis   . Vertigo    several episodes per year   Family History  Problem Relation Age of Onset  . Heart disease Mother   . Colon cancer Mother   . Hypertension Mother   . Heart disease Father   . Hypertension Father   . Breast cancer Neg Hx    Past Surgical History:  Procedure Laterality Date  . BREAST BIOPSY Left 12/28/2016   stereo path pend  . BREAST LUMPECTOMY    . COLONOSCOPY WITH PROPOFOL N/A 09/08/2017   Procedure: COLONOSCOPY WITH PROPOFOL;  Surgeon: Melissa Lame, MD;  Location: [facility];  Service: Endoscopy;  Laterality: N/A;  . POLYPECTOMY  09/08/2017   Procedure: POLYPECTOMY;  Surgeon: Melissa Lame, MD;  Location: [place];  Service: Endoscopy;;  .  SPLENECTOMY, TOTAL      Short Social History:  Social History   Tobacco Use  . Smoking status: Never Smoker  . Smokeless tobacco: Never Used  . Tobacco comment: smoking cessation materials not required  Substance Use Topics  . Alcohol use: No    Alcohol/week: 0.0 standard drinks    Allergies  Allergen Reactions  . Aspirin   . Macrobid [Nitrofurantoin]   . 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 Sulfa Antibiotics Other (See Comments)    Platelets drop     Current Outpatient Medications  Medication Sig Dispense Refill  . ALPRAZolam (XANAX) 0.25 MG tablet TAKE (1) TABLET BY MOUTH EVERY DAY AS NEEDED 30 tablet 0  . Calcium Citrate-Vitamin D (CALCIUM + D PO) Take 1 tablet by mouth 2 (two) times daily.    . DULoxetine (CYMBALTA) 60 MG capsule Take by mouth.    . gabapentin (NEURONTIN) 300 MG capsule TAKE 1 CAPSULE BY MOUTH TWICE DAILY (Patient taking differently: Take 300 mg by mouth at bedtime. ) 60 capsule 1  . Magnesium 400 MG CAPS Take 1 capsule by mouth 2 (two) times daily.     . Multiple Vitamins-Minerals (OCUVITE ADULT 50+ PO) Take 1 each by mouth 2 (two) times daily.    Marland Kitchen nystatin cream (MYCOSTATIN) Apply 1  application topically 2 (two) times daily. 30 g 5  . oxybutynin (DITROPAN-XL) 5 MG 24 hr tablet Take 1 tablet (5 mg total) by mouth at bedtime. 30 tablet 1  . senna (SENOKOT) 8.6 MG tablet Take 1 tablet by mouth as needed for constipation.    . simvastatin (ZOCOR) 20 MG tablet TAKE ONE TABLET BY MOUTH AT BEDTIME 90 tablet 0  . vitamin E 400 UNIT capsule Take 400 Units by mouth daily.    . fluconazole (DIFLUCAN) 150 MG tablet     . Inulin (FIBERCHOICE PO) Take by mouth.     No current facility-administered medications for this visit.     Review of Systems  Constitutional: Negative.   HENT: Negative.   Eyes: Negative.   Respiratory: Negative.    Cardiovascular: Negative.   Gastrointestinal: Negative.   Genitourinary:       Vaginal irritation, redness  Musculoskeletal: Negative.   Skin: Positive for itching and rash.  Neurological: Negative.   Endo/Heme/Allergies: Negative.   Psychiatric/Behavioral: Negative.         Objective:  Objective   Vitals:   04/11/19 1049  BP: 119/65  Pulse: 95  Weight: 152 lb (68.9 kg)  Height: 5\' 6"  (1.676 m)   Body mass index is 24.53 kg/m. Constitutional: Well nourished, well developed female in no acute distress.  HEENT: normal Skin: Warm and dry.  Cardiovascular: Regular rate and rhythm.   Extremity: no edema  Respiratory: Clear to auscultation bilateral. Normal respiratory effort Neuro: DTRs 2+, Cranial nerves grossly intact Psych: Alert and Oriented x3. No memory deficits. Normal mood and affect.  MS: normal gait, normal bilateral lower extremity ROM/strength/stability.  Pelvic exam:  is not limited by body habitus EGBUS: red, slightly excoriated right side vulvar, redness with intact skin perineal/perianal bilateral with > on right side Vagina: within normal post menopausal limits  Cervix: normal appearance   Data: Vaginitis swab collected     Assessment/Plan:     73 yo post menopausal female with adult diaper rash  Daily tub soak or sitz bath with sea salt Barrier cream BID such as A&D ointment or other healing balm   [facility] 04/11/2019, 11:48 AM

## 2019-04-11 NOTE — Progress Notes (Unsigned)
Ref for GYN placed

## 2019-04-15 LAB — CERVICOVAGINAL ANCILLARY ONLY
Bacterial Vaginitis (gardnerella): NEGATIVE
Candida Glabrata: NEGATIVE
Candida Vaginitis: NEGATIVE
Comment: NEGATIVE
Comment: NEGATIVE
Comment: NEGATIVE

## 2019-04-19 ENCOUNTER — Telehealth: Payer: Self-pay | Admitting: Advanced Practice Midwife

## 2019-04-19 NOTE — Telephone Encounter (Signed)
Patient is call for her culture results. Please advise

## 2019-04-23 NOTE — Telephone Encounter (Signed)
I did try calling last week- left a voicemail and sent MyChart message regarding normal lab results.

## 2019-05-20 ENCOUNTER — Encounter: Payer: Self-pay | Admitting: Family Medicine

## 2019-05-20 ENCOUNTER — Ambulatory Visit (INDEPENDENT_AMBULATORY_CARE_PROVIDER_SITE_OTHER): Payer: Medicare Other | Admitting: Family Medicine

## 2019-05-20 ENCOUNTER — Other Ambulatory Visit: Payer: Self-pay

## 2019-05-20 VITALS — BP 138/84 | HR 100 | Ht 66.0 in | Wt 152.0 lb

## 2019-05-20 DIAGNOSIS — R000 Tachycardia, unspecified: Secondary | ICD-10-CM

## 2019-05-20 DIAGNOSIS — R030 Elevated blood-pressure reading, without diagnosis of hypertension: Secondary | ICD-10-CM | POA: Diagnosis not present

## 2019-05-20 DIAGNOSIS — F418 Other specified anxiety disorders: Secondary | ICD-10-CM

## 2019-05-20 DIAGNOSIS — R002 Palpitations: Secondary | ICD-10-CM

## 2019-05-20 NOTE — Progress Notes (Signed)
Date:  05/20/2019   Name:  Melissa Daniels   DOB:  1946/05/28   MRN:  [id number]   Chief Complaint: Hypertension (started feeling "jittery" for about 3 days- took b/p on machine x 2 today and it was elevated both times)  Hypertension This is a chronic problem. The current episode started more than 1 year ago. The problem has been gradually improving since onset. The problem is controlled. Pertinent negatives include no anxiety, blurred vision, chest pain, headaches, malaise/fatigue, neck pain, orthopnea, palpitations, peripheral edema, PND, shortness of breath or sweats. There are no associated agents to hypertension. Past treatments include lifestyle changes. The current treatment provides mild improvement. Compliance problems include diet.  There is no history of angina, kidney disease, CAD/MI, CVA, heart failure, left ventricular hypertrophy, PVD or retinopathy. There is no history of chronic renal disease, a hypertension causing med or renovascular disease.  Anxiety Presents for initial visit. Symptoms include nervous/anxious behavior. Patient reports no chest pain, depressed mood, dizziness, excessive worry, hyperventilation, insomnia, nausea, palpitations, panic or shortness of breath. The severity of symptoms is mild.    Palpitations  This is a chronic problem. The current episode started 1 to 4 weeks ago (couple weeks). The problem occurs every several days. Associated symptoms include anxiety. Pertinent negatives include no chest fullness, chest pain, coughing, dizziness, fever, irregular heartbeat, malaise/fatigue, nausea, numbness, shortness of breath, vomiting or weakness. She has tried nothing for the symptoms.    Lab Results  Component Value Date   CREATININE 0.69 11/27/2018   BUN 14 11/27/2018   NA 142 11/27/2018   K 4.6 11/27/2018   CL 101 11/27/2018   CO2 26 11/27/2018   Lab Results  Component Value Date   CHOL 197 11/27/2018   HDL 56 11/27/2018   LDLCALC 120 (H)  11/27/2018   TRIG 105 11/27/2018   CHOLHDL 3.5 11/27/2018   No results found for: TSH No results found for: HGBA1C   Review of Systems  Constitutional: Negative.  Negative for chills, fatigue, fever, malaise/fatigue and unexpected weight change.  HENT: Negative for congestion, ear discharge, ear pain, rhinorrhea, sinus pressure, sneezing and sore throat.   Eyes: Negative for blurred vision, photophobia, pain, discharge, redness and itching.  Respiratory: Negative for cough, shortness of breath, wheezing and stridor.   Cardiovascular: Negative for chest pain, palpitations, orthopnea and PND.  Gastrointestinal: Negative for abdominal pain, blood in stool, constipation, diarrhea, nausea and vomiting.  Endocrine: Negative for cold intolerance, heat intolerance, polydipsia, polyphagia and polyuria.  Genitourinary: Negative for dysuria, flank pain, frequency, hematuria, menstrual problem, pelvic pain, urgency, vaginal bleeding and vaginal discharge.  Musculoskeletal: Negative for arthralgias, back pain, myalgias and neck pain.  Skin: Negative for rash.  Allergic/Immunologic: Negative for environmental allergies and food allergies.  Neurological: Negative for dizziness, weakness, light-headedness, numbness and headaches.  Hematological: Negative for adenopathy. Does not bruise/bleed easily.  Psychiatric/Behavioral: Negative for dysphoric mood. The patient is nervous/anxious. The patient does not have insomnia.     Patient Active Problem List   Diagnosis Date Noted  . Sixth (abducent) nerve palsy, 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  . Abscess of finger of right hand   . Abscess of left forearm   . Cellulitis 10/21/2017  . 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 Personal history of colonic polyps   . Benign neoplasm of cecum   . Benign neoplasm of transverse colon   . Benign neoplasm of ascending colon   . Malignant neoplasm of upper-outer quadrant of left breast in female, estrogen receptor  positive ([place]) 01/10/2017  .  Neuropathy 10/31/2016  . Taking medication for chronic disease 10/31/2016  . Primary osteoarthritis of right hip 10/31/2016  . 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[place]) 10/19/2015  . Menopause 10/19/2015  . Hormone replacement therapy (HRT) 10/19/2015  . 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 Routine general medical examination at a health care facility 10/10/2014  . 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 Female stress incontinence 10/10/2014  . Colon polyp 10/10/2014  . Episodic paroxysmal anxiety disorder 10/10/2014    Allergies  Allergen Reactions  . Aspirin   . Macrobid [Nitrofurantoin]   . 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 Sulfa Antibiotics Other (See Comments)    Platelets drop     Past Surgical History:  Procedure Laterality Date  . BREAST BIOPSY Left 12/28/2016   stereo path pend  . BREAST LUMPECTOMY    . COLONOSCOPY WITH PROPOFOL N/A 09/08/2017   Procedure: COLONOSCOPY WITH PROPOFOL;  Surgeon: Lucilla Lame, MD;  Location: [facility];  Service: Endoscopy;  Laterality: N/A;  . POLYPECTOMY  09/08/2017   Procedure: POLYPECTOMY;  Surgeon: Lucilla Lame, MD;  Location: [facility];  Service: Endoscopy;;  . SPLENECTOMY, TOTAL      Social History   Tobacco Use  . Smoking status: Never Smoker  . Smokeless tobacco: Never Used  . Tobacco comment: smoking cessation materials not required  Substance Use Topics  . Alcohol use: No    Alcohol/week: 0.0 standard drinks  . Drug use: No     Medication list has been reviewed and updated.  Current Meds  Medication Sig  . ALPRAZolam (XANAX) 0.25 MG tablet TAKE (1) TABLET BY MOUTH EVERY DAY AS NEEDED  . Calcium Citrate-Vitamin D (CALCIUM + D PO) Take 1 tablet by mouth 2 (two) times daily.  . DULoxetine (CYMBALTA) 30 MG capsule Take 1 capsule by mouth daily. 60+30= 90mg / [facility]  doctor  . DULoxetine (CYMBALTA) 60 MG capsule Take 60 mg by mouth daily.   Marland Kitchen gabapentin (NEURONTIN) 300 MG capsule TAKE 1 CAPSULE BY MOUTH TWICE DAILY (Patient taking differently: Take 300 mg by mouth at bedtime. )  . Inulin (FIBERCHOICE PO) Take by mouth.  . Magnesium 400 MG CAPS Take 1 capsule by mouth 2 (two) times daily.   . Multiple Vitamins-Minerals (OCUVITE ADULT 50+ PO) Take 1 each by mouth 2 (two) times daily.  . Multiple Vitamins-Minerals (VITAMIN D3 COMPLETE PO) Take 1 capsule by mouth daily.  Marland Kitchen oxybutynin (DITROPAN-XL) 5 MG 24 hr tablet Take 1 tablet (5 mg total) by mouth at bedtime.  . senna (SENOKOT) 8.6 MG tablet Take 1 tablet by mouth as needed for constipation.  . simvastatin (ZOCOR) 20 MG tablet TAKE ONE TABLET BY MOUTH AT BEDTIME  . vitamin E 400 UNIT capsule Take 400 Units by mouth daily.  . [DISCONTINUED] fluconazole (DIFLUCAN) 150 MG tablet     PHQ 2/9 Scores 05/20/2019 03/29/2019 11/27/2018 08/22/2018  PHQ - 2 Score 0 0 0 0  PHQ- 9 Score 0 0 1 -    BP Readings from Last 3 Encounters:  05/20/19 138/84  04/11/19 119/65  03/29/19 124/80    Physical Exam Vitals and nursing note reviewed.  Constitutional:      General: She is not in acute distress.    Appearance: She is not diaphoretic.  HENT:     Head: Normocephalic and atraumatic.  Right Ear: Tympanic membrane, ear canal and external ear normal. There is no impacted cerumen.     Left Ear: Tympanic membrane, ear canal and external ear normal. There is no impacted cerumen.     Nose: Nose normal. No congestion or rhinorrhea.  Eyes:     General:        Right eye: No discharge.        Left eye: No discharge.     Conjunctiva/sclera: Conjunctivae normal.     Pupils: Pupils are equal, round, and reactive to light.  Neck:     Thyroid: No thyromegaly.     Vascular: No carotid bruit or JVD.  Cardiovascular:     Rate and Rhythm: Normal rate and regular rhythm.     Pulses: Normal pulses.     Heart sounds: Normal  heart sounds. No murmur. No friction rub. No gallop.   Pulmonary:     Effort: Pulmonary effort is normal.     Breath sounds: Normal breath sounds. No wheezing or rhonchi.  Abdominal:     General: Bowel sounds are normal.     Palpations: Abdomen is soft. There is no mass.     Tenderness: There is no abdominal tenderness. There is no guarding.  Musculoskeletal:        General: Normal range of motion.     Cervical back: Normal range of motion and neck supple.  Lymphadenopathy:     Cervical: No cervical adenopathy.  Skin:    General: Skin is warm and dry.     Capillary Refill: Capillary refill takes less than 2 seconds.  Neurological:     Mental Status: She is alert.     Deep Tendon Reflexes: Reflexes are normal and symmetric.     Wt Readings from Last 3 Encounters:  05/20/19 152 lb (68.9 kg)  04/11/19 152 lb (68.9 kg)  03/29/19 153 lb (69.4 kg)    BP 138/84   Pulse 100   Ht 5\' 6"  (1.676 m)   Wt 152 lb (68.9 kg)   BMI 24.53 kg/m   Assessment and Plan: 1. Palpitations New onset.  Episodic.  Does not occur on a daily basis but has been increasing over the last couple of weeks.  Patient describes a feeling of feeling "jittery" in the chest area.  There is no near syncope or chest discomfort associated with it.  KG was done.I have reviewed EKG which shows normal sinus rhythm with a rate of 86.  Normal intervals.  There is some decrease in R wave and voltage that may be due to lung disease which patient does not have.  There is no ischemic changes.. Comparison to previous EKG dated none to compare to. - EKG 12-Lead  2. Elevated blood pressure reading Patient has had elevated blood pressure readings at home.  Blood pressure readings at clinic have been normal.  We have discussed the recent increase of duloxetine from 60-90.  That this is a risk factor of duloxetine to have tachycardia, palpitations, and elevated blood pressure.  Patient is going to discontinue the 30 mg at night and  return to the 60 mg dosing.  3. Tachycardia Last 2 pulse readings have been 98 100 which I believe may have been during the winter that she is at the increase in duloxetine.  We will see if this will come down in the normal range for her with the discontinuance.  4. Situational anxiety Patient does not admit to significant anxiety but when pointed out that were  all under some level of stress given the Covid situation patient agrees that there is some anxiety and stress that is plan upon everyone's concerns.  Will decrease duloxetine from 90 to 60 mg.  Anxiety may also be a side effect of increasing duloxetine.

## 2019-06-01 ENCOUNTER — Other Ambulatory Visit: Payer: Self-pay | Admitting: Family Medicine

## 2019-06-01 DIAGNOSIS — E7849 Other hyperlipidemia: Secondary | ICD-10-CM

## 2019-06-01 DIAGNOSIS — N393 Stress incontinence (female) (male): Secondary | ICD-10-CM

## 2019-06-06 ENCOUNTER — Telehealth: Payer: Self-pay

## 2019-06-06 NOTE — Telephone Encounter (Signed)
Pt called in wanting to know if she should get the covid vaccine because of having a concern for her platelets and reactions to other vaccines. I explained to her that Dr Ronnald Ramp is recommending the covid vaccine, but to be able to say that she will be ok to get it with the above issues or concerns, we can't do that. It would be better to speak to hematologist about this. I gave her the names of the [facility] that, it appears, she saw in 2020. She said she would call them

## 2019-06-07 DIAGNOSIS — H532 Diplopia: Secondary | ICD-10-CM | POA: Diagnosis not present

## 2019-06-13 DIAGNOSIS — H532 Diplopia: Secondary | ICD-10-CM | POA: Diagnosis not present

## 2019-06-14 DIAGNOSIS — R9089 Other abnormal findings on diagnostic imaging of central nervous system: Secondary | ICD-10-CM | POA: Diagnosis not present

## 2019-06-14 DIAGNOSIS — H532 Diplopia: Secondary | ICD-10-CM | POA: Diagnosis not present

## 2019-06-20 ENCOUNTER — Other Ambulatory Visit: Payer: Self-pay | Admitting: Family Medicine

## 2019-06-20 DIAGNOSIS — E7849 Other hyperlipidemia: Secondary | ICD-10-CM

## 2019-06-20 DIAGNOSIS — N393 Stress incontinence (female) (male): Secondary | ICD-10-CM

## 2019-06-21 ENCOUNTER — Other Ambulatory Visit: Payer: Self-pay

## 2019-06-21 ENCOUNTER — Other Ambulatory Visit: Payer: Self-pay | Admitting: Family Medicine

## 2019-06-21 DIAGNOSIS — Z780 Asymptomatic menopausal state: Secondary | ICD-10-CM

## 2019-06-21 DIAGNOSIS — E7849 Other hyperlipidemia: Secondary | ICD-10-CM

## 2019-06-21 DIAGNOSIS — N393 Stress incontinence (female) (male): Secondary | ICD-10-CM

## 2019-06-21 NOTE — Progress Notes (Unsigned)
Bone density put in

## 2019-06-24 ENCOUNTER — Other Ambulatory Visit: Payer: Self-pay | Admitting: Family Medicine

## 2019-06-24 DIAGNOSIS — E7849 Other hyperlipidemia: Secondary | ICD-10-CM

## 2019-06-24 DIAGNOSIS — N393 Stress incontinence (female) (male): Secondary | ICD-10-CM

## 2019-06-28 ENCOUNTER — Other Ambulatory Visit: Payer: Self-pay

## 2019-06-28 ENCOUNTER — Encounter: Payer: Self-pay | Admitting: Family Medicine

## 2019-06-28 ENCOUNTER — Ambulatory Visit (INDEPENDENT_AMBULATORY_CARE_PROVIDER_SITE_OTHER): Payer: Medicare Other | Admitting: Family Medicine

## 2019-06-28 VITALS — BP 118/78 | HR 98 | Temp 98.3°F | Ht 66.0 in | Wt 153.0 lb

## 2019-06-28 DIAGNOSIS — F410 Panic disorder [episodic paroxysmal anxiety] without agoraphobia: Secondary | ICD-10-CM

## 2019-06-28 DIAGNOSIS — E7849 Other hyperlipidemia: Secondary | ICD-10-CM | POA: Diagnosis not present

## 2019-06-28 DIAGNOSIS — N393 Stress incontinence (female) (male): Secondary | ICD-10-CM

## 2019-06-28 DIAGNOSIS — D693 Immune thrombocytopenic purpura: Secondary | ICD-10-CM

## 2019-06-28 MED ORDER — ALPRAZOLAM 0.25 MG PO TABS: ORAL_TABLET | ORAL | 5 refills | Status: DC

## 2019-06-28 MED ORDER — SIMVASTATIN 20 MG PO TABS: ORAL_TABLET | ORAL | 1 refills | Status: DC

## 2019-06-28 MED ORDER — OXYBUTYNIN CHLORIDE ER 5 MG PO TB24: ORAL_TABLET | ORAL | 1 refills | Status: DC

## 2019-06-28 MED ORDER — GABAPENTIN 300 MG PO CAPS: 300.0000 mg | ORAL_CAPSULE | Freq: Every day | ORAL | 1 refills | Status: AC

## 2019-06-28 NOTE — Progress Notes (Signed)
Date:  06/28/2019   Name:  Melissa Daniels   DOB:  Apr 23, 1946   MRN:  [id number]   Chief Complaint: Anxiety (Wants 90 day refills for now on.)  Anxiety Presents for initial visit. The problem has been waxing and waning. Patient reports no chest pain, compulsions, confusion, decreased concentration, depressed mood, dizziness, dry mouth, excessive worry, feeling of choking, hyperventilation, impotence, insomnia, irritability, malaise, muscle tension, nausea, nervous/anxious behavior, obsessions, palpitations, panic, restlessness, shortness of breath or suicidal ideas. Primary symptoms comment: incontinence/for neuropathy. Symptoms occur occasionally.    Female GU Problem The patient's pertinent negatives include no genital itching, genital lesions, genital odor, genital rash, missed menses, pelvic pain, vaginal bleeding or vaginal discharge. Primary symptoms comment: incontinence/for neuropathy. This is a chronic problem. The current episode started more than 1 year ago. The problem has been gradually improving. Pertinent negatives include no abdominal pain, back pain, chills, constipation, diarrhea, dysuria, fever, flank pain, frequency, headaches, hematuria, nausea, rash, sore throat, urgency or vomiting.  Neurologic Problem The patient's pertinent negatives include no altered mental status, clumsiness, focal sensory loss, focal weakness, loss of balance, memory loss, near-syncope, slurred speech, syncope, visual change or weakness. Primary symptoms comment: incontinence/for neuropathy. The neurological problem developed gradually. Pertinent negatives include no abdominal pain, back pain, chest pain, confusion, dizziness, fatigue, fever, headaches, light-headedness, nausea, palpitations, shortness of breath or vomiting. The treatment provided moderate relief. There is no history of liver disease.  Hyperlipidemia This is a chronic problem. The current episode started more than 1 year ago. The problem is  controlled. Recent lipid tests were reviewed and are normal. She has no history of chronic renal disease, diabetes, hypothyroidism, liver disease, obesity or nephrotic syndrome. Pertinent negatives include no chest pain, focal sensory loss, focal weakness, myalgias or shortness of breath.    Lab Results  Component Value Date   CREATININE 0.69 11/27/2018   BUN 14 11/27/2018   NA 142 11/27/2018   K 4.6 11/27/2018   CL 101 11/27/2018   CO2 26 11/27/2018   Lab Results  Component Value Date   CHOL 197 11/27/2018   HDL 56 11/27/2018   LDLCALC 120 (H) 11/27/2018   TRIG 105 11/27/2018   CHOLHDL 3.5 11/27/2018   No results found for: TSH No results found for: HGBA1C   Review of Systems  Constitutional: Negative.  Negative for chills, fatigue, fever, irritability and unexpected weight change.  HENT: Negative for congestion, ear discharge, ear pain, rhinorrhea, sinus pressure, sneezing and sore throat.   Eyes: Negative for photophobia, pain, discharge, redness and itching.  Respiratory: Negative for cough, shortness of breath, wheezing and stridor.   Cardiovascular: Negative for chest pain, palpitations and near-syncope.  Gastrointestinal: Negative for abdominal pain, blood in stool, constipation, diarrhea, nausea and vomiting.  Endocrine: Negative for cold intolerance, heat intolerance, polydipsia, polyphagia and polyuria.  Genitourinary: Negative for dysuria, flank pain, frequency, hematuria, impotence, menstrual problem, missed menses, pelvic pain, urgency, vaginal bleeding and vaginal discharge.  Musculoskeletal: Negative for arthralgias, back pain and myalgias.  Skin: Negative for rash.  Allergic/Immunologic: Negative for environmental allergies and food allergies.  Neurological: Negative for dizziness, focal weakness, syncope, weakness, light-headedness, numbness, headaches and loss of balance.  Hematological: Negative for adenopathy. Does not bruise/bleed easily.   Psychiatric/Behavioral: Negative for confusion, decreased concentration, dysphoric mood, memory loss and suicidal ideas. The patient is not nervous/anxious and does not have insomnia.     Patient Active Problem List   Diagnosis Date Noted  . Sixth (abducent) nerve  palsy, 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  . Abscess of finger of right hand   . Abscess of left forearm   . Cellulitis 10/21/2017  . 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 Personal history of colonic polyps   . Benign neoplasm of cecum   . Benign neoplasm of transverse colon   . Benign neoplasm of ascending colon   . Malignant neoplasm of upper-outer quadrant of left breast in female, estrogen receptor positive ([place]) 01/10/2017  . Neuropathy 10/31/2016  . Taking medication for chronic disease 10/31/2016  . Primary osteoarthritis of right hip 10/31/2016  . 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 Menopause 10/19/2015  . Hormone replacement therapy (HRT) 10/19/2015  . 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 Routine general medical examination at a health care facility 10/10/2014  . 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 Female stress incontinence 10/10/2014  . Colon polyp 10/10/2014  . Episodic paroxysmal anxiety disorder 10/10/2014    Allergies  Allergen Reactions  . Aspirin   . Macrobid [Nitrofurantoin]   . 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 Sulfa Antibiotics Other (See Comments)    Platelets drop     Past Surgical History:  Procedure Laterality Date  . BREAST BIOPSY Left 12/28/2016   stereo path pend  . BREAST LUMPECTOMY    . COLONOSCOPY WITH PROPOFOL N/A 09/08/2017   Procedure: COLONOSCOPY WITH PROPOFOL;  Surgeon: Lucilla Lame, MD;  Location: [facility];  Service: Endoscopy;  Laterality: N/A;  . POLYPECTOMY  09/08/2017   Procedure: POLYPECTOMY;  Surgeon: Lucilla Lame, MD;   Location: [facility];  Service: Endoscopy;;  . SPLENECTOMY, TOTAL      Social History   Tobacco Use  . Smoking status: Never Smoker  . Smokeless tobacco: Never Used  . Tobacco comment: smoking cessation materials not required  Substance Use Topics  . Alcohol use: No    Alcohol/week: 0.0 standard drinks  . Drug use: No     Medication list has been reviewed and updated.  Current Meds  Medication Sig  . ALPRAZolam (XANAX) 0.25 MG tablet TAKE (1) TABLET BY MOUTH EVERY DAY AS NEEDED  . Calcium Citrate-Vitamin D (CALCIUM + D PO) Take 1 tablet by mouth 2 (two) times daily.  . DULoxetine (CYMBALTA) 30 MG capsule Take 1 capsule by mouth 2 (two) times daily. [facility] doctor  . gabapentin (NEURONTIN) 300 MG capsule TAKE 1 CAPSULE BY MOUTH TWICE DAILY (Patient taking differently: Take 300 mg by mouth at bedtime. )  . Magnesium 400 MG CAPS Take 1 capsule by mouth 2 (two) times daily.   . Multiple Vitamins-Minerals (OCUVITE ADULT 50+ PO) Take 1 each by mouth 2 (two) times daily.  . Multiple Vitamins-Minerals (VITAMIN D3 COMPLETE PO) Take 1 capsule by mouth daily.  Marland Kitchen oxybutynin (DITROPAN-XL) 5 MG 24 hr tablet TAKE (1) TABLET BY MOUTH DAILY AT BEDTIME  . simvastatin (ZOCOR) 20 MG tablet TAKE (1) TABLET BY MOUTH DAILY AT BEDTIME  . vitamin E 400 UNIT capsule Take 400 Units by mouth daily.    PHQ 2/9 Scores 06/28/2019 05/20/2019 03/29/2019 11/27/2018  PHQ - 2 Score 0 0 0 0  PHQ- 9 Score 0 0 0 1    BP Readings from Last 3 Encounters:  06/28/19 118/78  05/20/19 138/84  04/11/19 119/65    Physical Exam Vitals and nursing note reviewed.  Constitutional:      General: She is  not in acute distress.    Appearance: She is not diaphoretic.  HENT:     Head: Normocephalic and atraumatic.     Right Ear: Tympanic membrane, ear canal and external ear normal.     Left Ear: Tympanic membrane, ear canal and external ear normal.     Nose: Nose normal. No congestion or rhinorrhea.  Eyes:      General:        Right eye: No discharge.        Left eye: No discharge.     Conjunctiva/sclera: Conjunctivae normal.     Pupils: Pupils are equal, round, and reactive to light.  Neck:     Thyroid: No thyromegaly.     Vascular: No JVD.  Cardiovascular:     Rate and Rhythm: Normal rate and regular rhythm.     Heart sounds: Normal heart sounds. No murmur. No friction rub. No gallop.   Pulmonary:     Effort: Pulmonary effort is normal.     Breath sounds: Normal breath sounds. No wheezing or rhonchi.  Abdominal:     General: Bowel sounds are normal.     Palpations: Abdomen is soft. There is no mass.     Tenderness: There is no abdominal tenderness. There is no right CVA tenderness, left CVA tenderness or guarding.  Musculoskeletal:        General: Normal range of motion.     Cervical back: Normal range of motion and neck supple.  Lymphadenopathy:     Cervical: No cervical adenopathy.  Skin:    General: Skin is warm and dry.     Capillary Refill: Capillary refill takes less than 2 seconds.     Findings: No bruising or erythema.  Neurological:     Mental Status: She is alert.     Deep Tendon Reflexes: Reflexes are normal and symmetric.     Wt Readings from Last 3 Encounters:  06/28/19 153 lb (69.4 kg)  05/20/19 152 lb (68.9 kg)  04/11/19 152 lb (68.9 kg)    BP 118/78   Pulse 98   Temp 98.3 F (36.8 C) (Oral)   Ht 5\' 6"  (1.676 m)   Wt 153 lb (69.4 kg)   SpO2 97%   BMI 24.69 kg/m   Assessment and Plan: 1. Episodic paroxysmal anxiety disorder Chronic.  Controlled.  Episodic.  Stable patient continues to take 1/2-1 alprazolam a day as needed as needed for anxiety/panic concern. - ALPRAZolam (XANAX) 0.25 MG tablet; TAKE (1) TABLET BY MOUTH EVERY DAY AS NEEDED  Dispense: 30 tablet; Refill: 5  2. Female stress incontinence .  Controlled.  Stable.  This is controlled on Ditropan XL 5 mg once a day.  Will recheck in 6 months - oxybutynin (DITROPAN-XL) 5 MG 24 hr tablet; TAKE  (1) TABLET BY MOUTH DAILY AT BEDTIME  Dispense: 90 tablet; Refill: 1  3. Familial multiple lipoprotein-type hyperlipidemia Chronic.  Controlled.  Stable.  Continue simvastatin 20 mg once a day.  Review of previous lipid panel in June 2020 is stable.  Will continue on simvastatin 20 mg once a day.  Recheck in 6 months - simvastatin (ZOCOR) 20 MG tablet; One a day  Dispense: 90 tablet; Refill: 1  4. 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  Patient desires to know whether she can take the Covid given this condition because she had a reaction when she took one of  the hepatitis vaccinations.  She has tolerated the influenza and pneumonia vaccines in the past.  Review of previous platelets have been stable patient was question whether or not she should take the Provera injection told her we do not know if it can affect platelet counts and if she wanted to watch for the time being and use caution that that would be okay and till we get more information on the side effect profile of the COVID-19 injections.

## 2019-07-04 ENCOUNTER — Other Ambulatory Visit: Payer: Self-pay

## 2019-07-04 ENCOUNTER — Ambulatory Visit
Admission: RE | Admit: 2019-07-04 | Discharge: 2019-07-04 | Disposition: A | Discharge status: Home / Self Care | Payer: Medicare Other | Source: Ambulatory Visit | Attending: Family Medicine | Admitting: Family Medicine

## 2019-07-04 DIAGNOSIS — Z780 Asymptomatic menopausal state: Secondary | ICD-10-CM

## 2019-07-04 DIAGNOSIS — M85852 Other specified disorders of bone density and structure, left thigh: Secondary | ICD-10-CM | POA: Diagnosis not present

## 2019-07-29 ENCOUNTER — Telehealth: Payer: Self-pay | Admitting: Hematology and Oncology

## 2019-07-29 NOTE — Telephone Encounter (Signed)
Re:  COVID-19 vaccine  Patient called to discuss feasibility of COVID-19 vaccine.  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  She is s/p splenectomy.  She has received steroids, vincristine and Rituxan in the past.  No records are available in Epic.  I have not seen the patient.  She has had a normal platelet count for approximately 18 years.  There have been reports of thrombocytopenia following the [employer] and Moderna COVID-19 vaccination.  Several questions were asked and answered.   Lequita Asal, MD

## 2019-08-07 DIAGNOSIS — H532 Diplopia: Secondary | ICD-10-CM | POA: Diagnosis not present

## 2019-08-07 DIAGNOSIS — R930 Abnormal findings on diagnostic imaging of skull and head, not elsewhere classified: Secondary | ICD-10-CM | POA: Diagnosis not present

## 2019-08-07 DIAGNOSIS — H5005 Alternating esotropia: Secondary | ICD-10-CM | POA: Diagnosis not present

## 2019-08-07 DIAGNOSIS — H2513 Age-related nuclear cataract, bilateral: Secondary | ICD-10-CM | POA: Diagnosis not present

## 2019-08-13 ENCOUNTER — Other Ambulatory Visit: Payer: Self-pay

## 2019-08-13 ENCOUNTER — Other Ambulatory Visit: Payer: Medicare Other

## 2019-08-13 DIAGNOSIS — D693 Immune thrombocytopenic purpura: Secondary | ICD-10-CM | POA: Diagnosis not present

## 2019-08-14 LAB — CBC WITH DIFFERENTIAL/PLATELET
Basophils Absolute: 0.1 10*3/uL (ref 0.0–0.2)
Basos: 1 %
EOS (ABSOLUTE): 0.2 10*3/uL (ref 0.0–0.4)
Eos: 4 %
Hematocrit: 40.2 % (ref 34.0–46.6)
Hemoglobin: 13.4 g/dL (ref 11.1–15.9)
Immature Grans (Abs): 0 10*3/uL (ref 0.0–0.1)
Immature Granulocytes: 0 %
Lymphocytes Absolute: 1.8 10*3/uL (ref 0.7–3.1)
Lymphs: 35 %
MCH: 31 pg (ref 26.6–33.0)
MCHC: 33.3 g/dL (ref 31.5–35.7)
MCV: 93 fL (ref 79–97)
Monocytes Absolute: 0.9 10*3/uL (ref 0.1–0.9)
Monocytes: 18 %
Neutrophils Absolute: 2.1 10*3/uL (ref 1.4–7.0)
Neutrophils: 42 %
Platelets: 279 10*3/uL (ref 150–450)
RBC: 4.32 x10E6/uL (ref 3.77–5.28)
RDW: 13.2 % (ref 11.7–15.4)
WBC: 5.1 10*3/uL (ref 3.4–10.8)

## 2019-08-28 ENCOUNTER — Ambulatory Visit (INDEPENDENT_AMBULATORY_CARE_PROVIDER_SITE_OTHER): Payer: Medicare Other

## 2019-08-28 VITALS — Ht 66.0 in | Wt 155.0 lb

## 2019-08-28 DIAGNOSIS — Z0000 Encounter for general adult medical examination without abnormal findings: Secondary | ICD-10-CM | POA: Diagnosis not present

## 2019-08-28 NOTE — Patient Instructions (Signed)
Melissa Daniels , Thank you for taking time to come for your Medicare Wellness Visit. I appreciate your ongoing commitment to your health goals. Please review the following plan we discussed and let me know if I can assist you in the future.   Screening recommendations/referrals: Colonoscopy: done 09/08/17. Repeat in 2022 Mammogram: done 03/06/19 Bone Density: done 07/04/19 Recommended yearly ophthalmology/optometry visit for glaucoma screening and checkup Recommended yearly dental visit for hygiene and checkup  Vaccinations: Influenza vaccine: done 02/18/19 Pneumococcal vaccine: done 2016 Tdap vaccine: done 10/19/15 Shingles vaccine: Shingrix discussed. Please contact your pharmacy for coverage information.  Covid-19: done 08/16/19  Advanced directives: Please bring a copy of your health care power of attorney and living will to the office at your convenience.  Conditions/risks identified: Keep up the great work!  Next appointment: Please follow up in one year for your Medicare Annual Wellness visit.     Preventive Care 37 Years and Older, Female Preventive care refers to lifestyle choices and visits with your health care provider that can promote health and wellness. What does preventive care include?  A yearly physical exam. This is also called an annual well check.  Dental exams once or twice a year.  Routine eye exams. Ask your health care provider how often you should have your eyes checked.  Personal lifestyle choices, including:  Daily care of your teeth and gums.  Regular physical activity.  Eating a healthy diet.  Avoiding tobacco and drug use.  Limiting alcohol use.  Practicing safe sex.  Taking low-dose aspirin every day.  Taking vitamin and mineral supplements as recommended by your health care provider. What happens during an annual well check? The services and screenings done by your health care provider during your annual well check will depend on your age,  overall health, lifestyle risk factors, and family history of disease. Counseling  Your health care provider may ask you questions about your:  Alcohol use.  Tobacco use.  Drug use.  Emotional well-being.  Home and relationship well-being.  Sexual activity.  Eating habits.  History of falls.  Memory and ability to understand (cognition).  Work and work Statistician.  Reproductive health. Screening  You may have the following tests or measurements:  Height, weight, and BMI.  Blood pressure.  Lipid and cholesterol levels. These may be checked every 5 years, or more frequently if you are over 34 years old.  Skin check.  Lung cancer screening. You may have this screening every year starting at age 27 if you have a 30-pack-year history of smoking and currently smoke or have quit within the past 15 years.  Fecal occult blood test (FOBT) of the stool. You may have this test every year starting at age 29.  Flexible sigmoidoscopy or colonoscopy. You may have a sigmoidoscopy every 5 years or a colonoscopy every 10 years starting at age 1.  Hepatitis C blood test.  Hepatitis B blood test.  Sexually transmitted disease (STD) testing.  Diabetes screening. This is done by checking your blood sugar (glucose) after you have not eaten for a while (fasting). You may have this done every 1-3 years.  Bone density scan. This is done to screen for osteoporosis. You may have this done starting at age 76.  Mammogram. This may be done every 1-2 years. Talk to your health care provider about how often you should have regular mammograms. Talk with your health care provider about your test results, treatment options, and if necessary, the need for more tests.  Vaccines  Your health care provider may recommend certain vaccines, such as:  Influenza vaccine. This is recommended every year.  Tetanus, diphtheria, and acellular pertussis (Tdap, Td) vaccine. You may need a Td booster every 10  years.  Zoster vaccine. You may need this after age 11.  Pneumococcal 13-valent conjugate (PCV13) vaccine. One dose is recommended after age 54.  Pneumococcal polysaccharide (PPSV23) vaccine. One dose is recommended after age 33. Talk to your health care provider about which screenings and vaccines you need and how often you need them. This information is not intended to replace advice given to you by your health care provider. Make sure you discuss any questions you have with your health care provider. Document Released: 06/12/2015 Document Revised: 02/03/2016 Document Reviewed: 03/17/2015 Elsevier Interactive Patient Education  2017 [employer] Prevention in the Home Falls can cause injuries. They can happen to people of all ages. There are many things you can do to make your home safe and to help prevent falls. What can I do on the outside of my home?  Regularly fix the edges of walkways and driveways and fix any cracks.  Remove anything that might make you trip as you walk through a door, such as a raised step or threshold.  Trim any bushes or trees on the path to your home.  Use bright outdoor lighting.  Clear any walking paths of anything that might make someone trip, such as rocks or tools.  Regularly check to see if handrails are loose or broken. Make sure that both sides of any steps have handrails.  Any raised decks and porches should have guardrails on the edges.  Have any leaves, snow, or ice cleared regularly.  Use sand or salt on walking paths during winter.  Clean up any spills in your garage right away. This includes oil or grease spills. What can I do in the bathroom?  Use night lights.  Install grab bars by the toilet and in the tub and shower. Do not use towel bars as grab bars.  Use non-skid mats or decals in the tub or shower.  If you need to sit down in the shower, use a plastic, non-slip stool.  Keep the floor dry. Clean up any water that  spills on the floor as soon as it happens.  Remove soap buildup in the tub or shower regularly.  Attach bath mats securely with double-sided non-slip rug tape.  Do not have throw rugs and other things on the floor that can make you trip. What can I do in the bedroom?  Use night lights.  Make sure that you have a light by your bed that is easy to reach.  Do not use any sheets or blankets that are too big for your bed. They should not hang down onto the floor.  Have a firm chair that has side arms. You can use this for support while you get dressed.  Do not have throw rugs and other things on the floor that can make you trip. What can I do in the kitchen?  Clean up any spills right away.  Avoid walking on wet floors.  Keep items that you use a lot in easy-to-reach places.  If you need to reach something above you, use a strong step stool that has a grab bar.  Keep electrical cords out of the way.  Do not use floor polish or wax that makes floors slippery. If you must use wax, use non-skid floor wax.  Do not have throw rugs and other things on the floor that can make you trip. What can I do with my stairs?  Do not leave any items on the stairs.  Make sure that there are handrails on both sides of the stairs and use them. Fix handrails that are broken or loose. Make sure that handrails are as long as the stairways.  Check any carpeting to make sure that it is firmly attached to the stairs. Fix any carpet that is loose or worn.  Avoid having throw rugs at the top or bottom of the stairs. If you do have throw rugs, attach them to the floor with carpet tape.  Make sure that you have a light switch at the top of the stairs and the bottom of the stairs. If you do not have them, ask someone to add them for you. What else can I do to help prevent falls?  Wear shoes that:  Do not have high heels.  Have rubber bottoms.  Are comfortable and fit you well.  Are closed at the  toe. Do not wear sandals.  If you use a stepladder:  Make sure that it is fully opened. Do not climb a closed stepladder.  Make sure that both sides of the stepladder are locked into place.  Ask someone to hold it for you, if possible.  Clearly mark and make sure that you can see:  Any grab bars or handrails.  First and last steps.  Where the edge of each step is.  Use tools that help you move around (mobility aids) if they are needed. These include:  Canes.  Walkers.  Scooters.  Crutches.  Turn on the lights when you go into a dark area. Replace any light bulbs as soon as they burn out.  Set up your furniture so you have a clear path. Avoid moving your furniture around.  If any of your floors are uneven, fix them.  If there are any pets around you, be aware of where they are.  Review your medicines with your doctor. Some medicines can make you feel dizzy. This can increase your chance of falling. Ask your doctor what other things that you can do to help prevent falls. This information is not intended to replace advice given to you by your health care provider. Make sure you discuss any questions you have with your health care provider. Document Released: 03/12/2009 Document Revised: 10/22/2015 Document Reviewed: 06/20/2014 Elsevier Interactive Patient Education  2017 [employer].

## 2019-08-28 NOTE — Progress Notes (Signed)
Subjective:   Melissa Daniels is a 74 y.o. female who presents for an Initial Medicare Annual Wellness Visit.  Virtual Visit via Telephone Note  I connected with [name] on 08/28/19 at  1:20 PM EDT by telephone and verified that I am speaking with the correct person using two identifiers.  Medicare Annual Wellness visit completed telephonically due to Covid-19 pandemic.   Location: Patient: home Provider: office    I discussed the limitations, risks, security and privacy concerns of performing an evaluation and management service by telephone and the availability of in person appointments. The patient expressed understanding and agreed to proceed.  Some vital signs may be absent or patient reported.   Clemetine Marker, LPN    Review of Systems      Cardiac Risk Factors include: advanced age (>31men, >73 women);dyslipidemia     Objective:    Today's Vitals   08/28/19 1323  Weight: 155 lb (70.3 kg)  Height: 5\' 6"  (1.676 m)   Body mass index is 25.02 kg/m.  Advanced Directives 08/28/2019 12/14/2018 08/22/2018 10/21/2017 10/21/2017 10/21/2017 09/08/2017  Does Patient Have a Medical Advance Directive? Yes Yes Yes - No Yes Yes  Type of Paramedic of [place];Living will [place];Living will Living will;Healthcare Power of [place];Living will [place];Living will [place] in Chart? No - copy requested - No - copy requested - - - Yes  Would patient like information on creating a medical advance directive? - - - No - Patient declined - - -    Current Medications (verified) Outpatient Encounter Medications as of 08/28/2019  Medication Sig  . ALPRAZolam (XANAX) 0.25 MG tablet TAKE (1) TABLET BY MOUTH EVERY DAY AS NEEDED  . Calcium Citrate-Vitamin D (CALCIUM + D PO) Take 1 tablet by mouth 2 (two) times daily.  . cholecalciferol (VITAMIN  D3) 25 MCG (1000 UNIT) tablet Take 1,000 Units by mouth daily.  . DULoxetine (CYMBALTA) 30 MG capsule Take 1 capsule by mouth 2 (two) times daily. [facility] doctor  . gabapentin (NEURONTIN) 300 MG capsule Take 1 capsule (300 mg total) by mouth at bedtime.  . Magnesium 400 MG CAPS Take 1 capsule by mouth 2 (two) times daily.   . Multiple Vitamins-Minerals (OCUVITE ADULT 50+ PO) Take 1 each by mouth 2 (two) times daily.  Marland Kitchen oxybutynin (DITROPAN-XL) 5 MG 24 hr tablet TAKE (1) TABLET BY MOUTH DAILY AT BEDTIME  . simvastatin (ZOCOR) 20 MG tablet One a day  . vitamin E 400 UNIT capsule Take 400 Units by mouth daily.  . [DISCONTINUED] Multiple Vitamins-Minerals (VITAMIN D3 COMPLETE PO) Take 1 capsule by mouth daily.   No facility-administered encounter medications on file as of 08/28/2019.    Allergies (verified) Aspirin, Macrobid [nitrofurantoin], Nsaids, and Sulfa antibiotics   History: Past Medical History:  Diagnosis Date  . Anxiety   . Arthritis    hands  . Double vision   . Hypercholesteremia   . ITP (idiopathic thrombocytopenic purpura)   . Malignant neoplasm of upper-outer quadrant of left breast in female, estrogen receptor positive ([place]) 01/10/2017  . Neuropathy    bilateral feet  . Osteoporosis   . Vertigo    several episodes per year   Past Surgical History:  Procedure Laterality Date  . BREAST BIOPSY Left 12/28/2016   stereo path pend  . BREAST LUMPECTOMY    . COLONOSCOPY WITH PROPOFOL N/A 09/08/2017  Procedure: COLONOSCOPY WITH PROPOFOL;  Surgeon: Lucilla Lame, MD;  Location: [facility];  Service: Endoscopy;  Laterality: N/A;  . POLYPECTOMY  09/08/2017   Procedure: POLYPECTOMY;  Surgeon: Lucilla Lame, MD;  Location: [facility];  Service: Endoscopy;;  . SPLENECTOMY, TOTAL     Family History  Problem Relation Age of Onset  . Heart disease Mother   . Colon cancer Mother   . Hypertension Mother   . Heart disease Father   . Hypertension Father   . Breast  cancer Neg Hx    Social History   Socioeconomic History  . Marital status: Widowed    Spouse name: Not on file  . Number of children: 3  . Years of education: Not on file  . Highest education level: 12th grade  Occupational History  . Occupation: Retired  Tobacco Use  . Smoking status: Never Smoker  . Smokeless tobacco: Never Used  . Tobacco comment: smoking cessation materials not required  Substance and Sexual Activity  . Alcohol use: No    Alcohol/week: 0.0 standard drinks  . Drug use: No  . Sexual activity: Not Currently  Other Topics Concern  . Not on file  Social History Narrative  . Not on file   Social Determinants of Health   Financial Resource Strain: Low Risk   . Difficulty of Paying Living Expenses: Not hard at all  Food Insecurity: No Food Insecurity  . Worried About Charity fundraiser in the Last Year: Never true  . Ran Out of Food in the Last Year: Never true  Transportation Needs: No Transportation Needs  . Lack of Transportation (Medical): No  . Lack of Transportation (Non-Medical): No  Physical Activity: Insufficiently Active  . Days of Exercise per Week: 5 days  . Minutes of Exercise per Session: 20 min  Stress: No Stress Concern Present  . Feeling of Stress : Not at all  Social Connections: Somewhat Isolated  . Frequency of Communication with Friends and Family: More than three times a week  . Frequency of Social Gatherings with Friends and Family: Three times a week  . Attends Religious Services: More than 4 times per year  . Active Member of Clubs or Organizations: No  . Attends Archivist Meetings: Never  . Marital Status: Widowed    Tobacco Counseling Counseling given: Not Answered Comment: smoking cessation materials not required   Clinical Intake:  Pre-visit preparation completed: Yes  Pain : No/denies pain     BMI - recorded: 25.02 Nutritional Status: BMI 25 -29 Overweight Nutritional Risks: None Diabetes:  No  How often do you need to have someone help you when you read instructions, pamphlets, or other written materials from your doctor or pharmacy?: 1 - Never  Interpreter Needed?: No  Information entered by :: Clemetine Marker LPN   Activities of Daily Living In your present state of health, do you have any difficulty performing the following activities: 08/28/2019  Hearing? N  Comment declines hearing aids  Vision? Y  Difficulty concentrating or making decisions? N  Walking or climbing stairs? N  Dressing or bathing? N  Doing errands, shopping? N  Preparing Food and eating ? N  Using the Toilet? N  In the past six months, have you accidently leaked urine? Y  Comment wears liners for protection  Do you have problems with loss of bowel control? N  Managing your Medications? N  Managing your Finances? N  Housekeeping or managing your Housekeeping? N  Some  recent data might be hidden     Immunizations and Health Maintenance Immunization History  Administered Date(s) Administered  . Influenza, High Dose Seasonal PF 03/08/2018  . Influenza,inj,quad, With Preservative 02/15/2019  . Influenza-Unspecified 01/29/2015, 02/27/2017, 01/29/2019  . Meningococcal Conjugate 08/17/2014  . PFIZER SARS-COV-2 Vaccination 08/16/2019  . Pneumococcal Conjugate-13 05/30/2013  . Pneumococcal Polysaccharide-23 04/02/2012, 05/30/2014  . Tdap 10/19/2015   Health Maintenance Due  Topic Date Due  . Meningococcal B Vaccine (1 of 4 - Increased Risk Bexsero 2-dose series) 11/15/1955    Patient Care Team: Juline Patch, MD as PCP - General (Family Medicine) Cornelia Copa, DO as Consulting Physician (Surgical Oncology) Reeder-Hayes, Aundra Millet, MD as Consulting Physician (Oncology) [place], [place] as Consulting Physician (Optometry)  Indicate any recent Medical Services you may have received from other than [facility] providers in the past year (date may be approximate).     Assessment:    This is a routine wellness examination for Melissa Daniels.  Hearing/Vision screen  Hearing Screening   125Hz  250Hz  500Hz  1000Hz  2000Hz  3000Hz  4000Hz  6000Hz  8000Hz   Right ear:           Left ear:           Comments: Pt denies hearing difficulty  Vision Screening Comments: [place] for vision screenings  Dietary issues and exercise activities discussed: Current Exercise Habits: Home exercise routine, Type of exercise: walking, Time (Minutes): 20, Frequency (Times/Week): 5, Weekly Exercise (Minutes/Week): 100, Intensity: Mild, Exercise limited by: neurologic condition(s)  Goals    . DIET - INCREASE WATER INTAKE     Recommend to drink at least 6-8 8oz glasses of water per day.    . Weight (lb) < 150 lb (68 kg)     Pt states she would like to lose 5 lbs      Depression Screen PHQ 2/9 Scores 08/28/2019 06/28/2019 05/20/2019 03/29/2019 11/27/2018 08/22/2018 08/21/2017  PHQ - 2 Score 0 0 0 0 0 0 0  PHQ- 9 Score - 0 0 0 1 - 0    Fall Risk Fall Risk  08/28/2019 05/20/2019 11/27/2018 08/22/2018 08/21/2017  Falls in the past year? 0 0 0 0 No  Number falls in past yr: 0 - - 0 -  Injury with Fall? 0 - - 0 -  Risk for fall due to : Impaired balance/gait - - - Impaired vision  Risk for fall due to: Comment - - - - wears eyeglasses  Follow up Falls prevention discussed Falls evaluation completed Falls evaluation completed Falls prevention discussed -    FALL RISK PREVENTION PERTAINING TO THE HOME:  Any stairs in or around the home? Yes  If so, do they handrails? Yes   Home free of loose throw rugs in walkways, pet beds, electrical cords, etc? Yes  Adequate lighting in your home to reduce risk of falls? Yes   ASSISTIVE DEVICES UTILIZED TO PREVENT FALLS:  Life alert? No  Use of a cane, walker or w/c? No  Grab bars in the bathroom? Yes  Shower chair or bench in shower? No  Elevated toilet seat or a handicapped toilet? Yes   DME ORDERS:  DME order needed?  No   TIMED UP AND GO:  Was  the test performed? No . Telephonic visit.   Education: Fall risk prevention has been discussed.  Intervention(s) required? No   Cognitive Function: 6CIT deferred for 2021 AWV; completed within the last 12 months plus additional testing at [facility]  6CIT Screen 11/27/2018 08/22/2018 08/21/2017 10/31/2016  What Year? 0 points 0 points 0 points 0 points  What month? 0 points 0 points 0 points 0 points  What time? 0 points 0 points 0 points 0 points  Count back from 20 0 points 0 points 0 points 0 points  Months in reverse 0 points 0 points 0 points 0 points  Repeat phrase 0 points 0 points 4 points 0 points  Total Score 0 0 4 0    Screening Tests Health Maintenance  Topic Date Due  . Meningococcal B Vaccine (1 of 4 - Increased Risk Bexsero 2-dose series) 11/15/1955  . MAMMOGRAM  03/05/2020  . COLONOSCOPY  09/08/2020  . TETANUS/TDAP  10/18/2025  . INFLUENZA VACCINE  Completed  . DEXA SCAN  Completed  . Hepatitis C Screening  Completed  . PNA vac Low Risk Adult  Completed    Qualifies for Shingles Vaccine? Yes  . Due for Shingrix. Education has been provided regarding the importance of this vaccine. Pt has been advised to call insurance company to determine out of pocket expense. Advised may also receive vaccine at local pharmacy or Health Dept. Verbalized acceptance and understanding.  Tdap: Up to date  Flu Vaccine: Up to date  Pneumococcal Vaccine: Up to date   Cancer Screenings:  Colorectal Screening: Completed 09/08/17. Repeat every 3 years;   Mammogram: Completed 03/06/19. Repeat every year;    Bone Density: Completed 07/04/19. Results reflect  OSTEOPENIA. Repeat every 2 years.   Lung Cancer Screening: (Low Dose CT Chest recommended if Age 46-80 years, 30 pack-year currently smoking OR have quit w/in 15years.) does not qualify.    Additional Screening:  Hepatitis C Screening: does qualify; Completed 10/31/16  Vision Screening: Recommended annual ophthalmology exams for  early detection of glaucoma and other disorders of the eye. Is the patient up to date with their annual eye exam?  Yes  Who is the provider or what is the name of the office in which the pt attends annual eye exams? [facility]  Dental Screening: Recommended annual dental exams for proper oral hygiene  Community Resource Referral:  CRR required this visit?  No      Plan:    I have personally reviewed and addressed the Medicare Annual Wellness questionnaire and have noted the following in the patient's chart:  A. Medical and social history B. Use of alcohol, tobacco or illicit drugs  C. Current medications and supplements D. Functional ability and status E.  Nutritional status F.  Physical activity G. Advance directives H. List of other physicians I.  Hospitalizations, surgeries, and ER visits in previous 12 months J.  [place] such as hearing and vision if needed, cognitive and depression L. Referrals and appointments   In addition, I have reviewed and discussed with patient certain preventive protocols, quality metrics, and best practice recommendations. A written personalized care plan for preventive services as well as general preventive health recommendations were provided to patient.   Signed,  Clemetine Marker, LPN Nurse Health Advisor   Nurse Notes: pt doing well and appreciative of visit today

## 2019-09-02 ENCOUNTER — Telehealth: Payer: Self-pay

## 2019-09-02 NOTE — Telephone Encounter (Signed)
Pt called wanting to know if she needed platelet counts due to getting covid vaccine. We checked the CBC on the 16th of March and it was normal. Dr Ronnald Ramp has advised her to complete the series and then we will discuss rechecking cbc after completion. Pt voiced understanding

## 2019-10-25 DIAGNOSIS — H5005 Alternating esotropia: Secondary | ICD-10-CM | POA: Diagnosis not present

## 2019-10-25 DIAGNOSIS — H2513 Age-related nuclear cataract, bilateral: Secondary | ICD-10-CM | POA: Diagnosis not present

## 2019-10-25 DIAGNOSIS — R930 Abnormal findings on diagnostic imaging of skull and head, not elsewhere classified: Secondary | ICD-10-CM | POA: Diagnosis not present

## 2019-10-25 DIAGNOSIS — H532 Diplopia: Secondary | ICD-10-CM | POA: Diagnosis not present

## 2019-10-30 DIAGNOSIS — R42 Dizziness and giddiness: Secondary | ICD-10-CM | POA: Diagnosis not present

## 2019-11-08 ENCOUNTER — Other Ambulatory Visit: Payer: Self-pay | Admitting: Family Medicine

## 2019-11-08 DIAGNOSIS — E7849 Other hyperlipidemia: Secondary | ICD-10-CM

## 2019-11-28 ENCOUNTER — Ambulatory Visit (INDEPENDENT_AMBULATORY_CARE_PROVIDER_SITE_OTHER): Payer: Medicare Other | Admitting: Family Medicine

## 2019-11-28 ENCOUNTER — Other Ambulatory Visit: Payer: Self-pay

## 2019-11-28 ENCOUNTER — Encounter: Payer: Self-pay | Admitting: Family Medicine

## 2019-11-28 VITALS — BP 120/80 | HR 72 | Ht 66.0 in | Wt 155.0 lb

## 2019-11-28 DIAGNOSIS — Z0000 Encounter for general adult medical examination without abnormal findings: Secondary | ICD-10-CM | POA: Diagnosis not present

## 2019-11-28 DIAGNOSIS — L03113 Cellulitis of right upper limb: Secondary | ICD-10-CM | POA: Diagnosis not present

## 2019-11-28 MED ORDER — CEPHALEXIN 500 MG PO CAPS: 500.0000 mg | ORAL_CAPSULE | Freq: Three times a day (TID) | ORAL | 0 refills | Status: DC

## 2019-11-28 MED ORDER — MUPIROCIN 2 % EX OINT: 1.0000 "application " | TOPICAL_OINTMENT | Freq: Two times a day (BID) | CUTANEOUS | 0 refills | Status: DC

## 2019-11-28 NOTE — Progress Notes (Signed)
Annual phy   Date:  11/28/2019   Name:  Melissa Daniels   DOB:  1945-09-06   MRN:  [id number]   Chief Complaint: Annual Exam (breast exam)  Patient is a 74 year old female who presents for a comprehensive physical exam. The patient reports the following problems: fatigue. Health maintenance has been reviewed up to date.   Lab Results  Component Value Date   CREATININE 0.69 11/27/2018   BUN 14 11/27/2018   NA 142 11/27/2018   K 4.6 11/27/2018   CL 101 11/27/2018   CO2 26 11/27/2018   Lab Results  Component Value Date   CHOL 197 11/27/2018   HDL 56 11/27/2018   LDLCALC 120 (H) 11/27/2018   TRIG 105 11/27/2018   CHOLHDL 3.5 11/27/2018   No results found for: TSH No results found for: HGBA1C Lab Results  Component Value Date   WBC 5.1 08/13/2019   HGB 13.4 08/13/2019   HCT 40.2 08/13/2019   MCV 93 08/13/2019   PLT 279 08/13/2019   Lab Results  Component Value Date   ALT 18 11/27/2018   AST 23 11/27/2018   ALKPHOS 98 11/27/2018   BILITOT 0.5 11/27/2018     Review of Systems  Constitutional: Positive for fatigue. Negative for chills, fever and unexpected weight change.  HENT: Negative for congestion, ear discharge, ear pain, rhinorrhea, sinus pressure, sneezing and sore throat.   Eyes: Negative for photophobia, pain, discharge, redness and itching.  Respiratory: Negative for cough, shortness of breath, wheezing and stridor.   Gastrointestinal: Negative for abdominal pain, blood in stool, constipation, diarrhea, nausea and vomiting.  Endocrine: Negative for cold intolerance, heat intolerance, polydipsia, polyphagia and polyuria.  Genitourinary: Negative for dysuria, flank pain, frequency, hematuria, menstrual problem, pelvic pain, urgency, vaginal bleeding and vaginal discharge.  Musculoskeletal: Negative for arthralgias, back pain and myalgias.  Skin: Negative for rash.  Allergic/Immunologic: Negative for environmental allergies and food allergies.  Neurological: Negative  for dizziness, weakness, light-headedness, numbness and headaches.  Hematological: Negative for adenopathy. Does not bruise/bleed easily.  Psychiatric/Behavioral: Negative for dysphoric mood. The patient is not nervous/anxious.     Patient Active Problem List   Diagnosis Date Noted  . Sixth (abducent) nerve palsy, 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  . Esotropia 03/26/2018  . Abscess of finger of right hand   . Abscess of left forearm   . Cellulitis 10/21/2017  . 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 Personal history of colonic polyps   . Benign neoplasm of cecum   . Benign neoplasm of transverse colon   . Benign neoplasm of ascending colon   . Malignant neoplasm of upper-outer quadrant of left breast in female, estrogen receptor positive ([place]) 01/10/2017  . Neuropathy 10/31/2016  . Taking medication for chronic disease 10/31/2016  . Primary osteoarthritis of right hip 10/31/2016  . 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[place]) 10/19/2015  . Menopause 10/19/2015  . Hormone replacement therapy (HRT) 10/19/2015  . 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 Routine general medical examination at a health care facility 10/10/2014  . 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 Female stress incontinence 10/10/2014  . Colon polyp 10/10/2014  . Episodic paroxysmal anxiety disorder 10/10/2014    Allergies  Allergen Reactions  . Aspirin   . Macrobid [Nitrofurantoin]   . 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 Sulfa Antibiotics Other (See Comments)    Platelets drop  Past Surgical History:  Procedure Laterality Date  . BREAST BIOPSY Left 12/28/2016   stereo path pend  . BREAST LUMPECTOMY    . COLONOSCOPY WITH PROPOFOL N/A 09/08/2017   Procedure: COLONOSCOPY WITH PROPOFOL;  Surgeon: Lucilla Lame, MD;  Location: [facility];  Service: Endoscopy;  Laterality: N/A;  . POLYPECTOMY   09/08/2017   Procedure: POLYPECTOMY;  Surgeon: Lucilla Lame, MD;  Location: [facility];  Service: Endoscopy;;  . SPLENECTOMY, TOTAL      Social History   Tobacco Use  . Smoking status: Never Smoker  . Smokeless tobacco: Never Used  . Tobacco comment: smoking cessation materials not required  Vaping Use  . Vaping Use: Never used  Substance Use Topics  . Alcohol use: No    Alcohol/week: 0.0 standard drinks  . Drug use: No     Medication list has been reviewed and updated.  Current Meds  Medication Sig  . ALPRAZolam (XANAX) 0.25 MG tablet TAKE (1) TABLET BY MOUTH EVERY DAY AS NEEDED  . Calcium Citrate-Vitamin D (CALCIUM + D PO) Take 1 tablet by mouth 2 (two) times daily.  . cholecalciferol (VITAMIN D3) 25 MCG (1000 UNIT) tablet Take 1,000 Units by mouth daily.  . DULoxetine (CYMBALTA) 60 MG capsule Take 1 capsule by mouth 2 (two) times daily. [facility] doctor  . gabapentin (NEURONTIN) 300 MG capsule Take 1 capsule (300 mg total) by mouth at bedtime.  . Magnesium 400 MG CAPS Take 1 capsule by mouth 2 (two) times daily.   . Multiple Vitamins-Minerals (OCUVITE ADULT 50+ PO) Take 1 each by mouth 2 (two) times daily.  Marland Kitchen oxybutynin (DITROPAN-XL) 5 MG 24 hr tablet TAKE (1) TABLET BY MOUTH DAILY AT BEDTIME  . simvastatin (ZOCOR) 20 MG tablet TAKE (1) TABLET BY MOUTH EVERY DAY  . vitamin E 400 UNIT capsule Take 400 Units by mouth daily.    PHQ 2/9 Scores 11/28/2019 08/28/2019 06/28/2019 05/20/2019  PHQ - 2 Score 0 0 0 0  PHQ- 9 Score 2 - 0 0    GAD 7 : Generalized Anxiety Score 11/28/2019 06/28/2019 03/29/2019  Nervous, Anxious, on Edge 0 0 0  Control/stop worrying 0 0 0  Worry too much - different things 0 0 0  Trouble relaxing 0 0 0  Restless 0 0 0  Easily annoyed or irritable 0 0 0  Afraid - awful might happen 0 0 0  Total GAD 7 Score 0 0 0  Anxiety Difficulty - Not difficult at all -    BP Readings from Last 3 Encounters:  11/28/19 120/80  06/28/19 118/78  05/20/19 138/84      Physical Exam Vitals and nursing note reviewed.  Constitutional:      General: She is not in acute distress.    Appearance: Normal appearance. She is well-developed, well-groomed and normal weight. She is not diaphoretic.  HENT:     Head: Normocephalic and atraumatic. No right periorbital erythema or left periorbital erythema.     Salivary Glands: Right salivary gland is not diffusely enlarged or tender. Left salivary gland is not diffusely enlarged or tender.     Right Ear: Tympanic membrane, ear canal and external ear normal.     Left Ear: Tympanic membrane, ear canal and external ear normal.     Nose: Nose normal.  Eyes:     General: Lids are normal. Vision grossly intact.        Right eye: No discharge.        Left eye:  No discharge.     Conjunctiva/sclera: Conjunctivae normal.     Pupils: Pupils are equal, round, and reactive to light.     Funduscopic exam:    Right eye: Red reflex present.        Left eye: Red reflex present. Neck:     Thyroid: No thyroid mass, thyromegaly or thyroid tenderness.     Vascular: Normal carotid pulses. No carotid bruit, hepatojugular reflux or JVD.     Trachea: Trachea and phonation normal.  Cardiovascular:     Rate and Rhythm: Normal rate and regular rhythm.     Chest Wall: PMI is not displaced. No thrill.     Pulses: Normal pulses.          Carotid pulses are 2+ on the right side and 2+ on the left side.      Radial pulses are 2+ on the right side and 2+ on the left side.       Femoral pulses are 2+ on the right side and 2+ on the left side.      Popliteal pulses are 2+ on the right side and 2+ on the left side.       Dorsalis pedis pulses are 2+ on the right side and 2+ on the left side.       Posterior tibial pulses are 2+ on the right side and 2+ on the left side.     Heart sounds: Normal heart sounds, S1 normal and S2 normal. No murmur heard.  No systolic murmur is present.  No diastolic murmur is present.  No friction rub. No  gallop. No S3 or S4 sounds.   Pulmonary:     Effort: Pulmonary effort is normal.     Breath sounds: Normal breath sounds. No decreased breath sounds, wheezing, rhonchi or rales.  Chest:     Breasts: Breasts are symmetrical.        Right: Normal. No swelling, bleeding, inverted nipple, mass, nipple discharge, skin change or tenderness.        Left: Normal. No swelling, bleeding, inverted nipple, mass, nipple discharge, skin change or tenderness.  Abdominal:     General: Bowel sounds are normal.     Palpations: Abdomen is soft. There is no hepatomegaly, splenomegaly or mass.     Tenderness: There is no abdominal tenderness. There is no right CVA tenderness, left CVA tenderness or guarding.     Hernia: No hernia is present. There is no hernia in the umbilical area or ventral area.  Genitourinary:    Rectum: Normal. Guaiac result negative. No mass.  Musculoskeletal:        General: Normal range of motion.     Cervical back: Normal, full passive range of motion without pain, normal range of motion and neck supple.     Thoracic back: Normal.     Lumbar back: Normal.  Lymphadenopathy:     Head:     Right side of head: No submental or submandibular adenopathy.     Left side of head: No submental or submandibular adenopathy.     Cervical: No cervical adenopathy.     Right cervical: No superficial, deep or posterior cervical adenopathy.    Left cervical: No superficial, deep or posterior cervical adenopathy.     Upper Body:     Right upper body: No supraclavicular, axillary or pectoral adenopathy.     Left upper body: No supraclavicular, axillary or pectoral adenopathy.  Skin:    General: Skin is warm and dry.  Capillary Refill: Capillary refill takes less than 2 seconds.     Findings: No abrasion or abscess. Rash is not crusting or macular.  Neurological:     Mental Status: She is alert and oriented to person, place, and time.     Sensory: Sensation is intact.     Motor: Motor function  is intact.     Deep Tendon Reflexes: Reflexes are normal and symmetric.     Reflex Scores:      Tricep reflexes are 2+ on the right side and 2+ on the left side.      Bicep reflexes are 2+ on the right side and 2+ on the left side.      Brachioradialis reflexes are 2+ on the right side and 2+ on the left side.      Patellar reflexes are 2+ on the right side and 2+ on the left side.      Achilles reflexes are 2+ on the right side and 2+ on the left side. Psychiatric:        Attention and Perception: Attention normal.        Mood and Affect: Mood and affect normal.        Speech: Speech normal.        Behavior: Behavior normal. Behavior is cooperative.     Wt Readings from Last 3 Encounters:  11/28/19 155 lb (70.3 kg)  08/28/19 155 lb (70.3 kg)  06/28/19 153 lb (69.4 kg)    BP 120/80   Pulse 72   Ht 5\' 6"  (1.676 m)   Wt 155 lb (70.3 kg)   BMI 25.02 kg/m   Assessment and Plan:  1. Annual physical exam No subjective/objective concerns noted on history and physical exam.  Patient's chart was reviewed for previous encounters, most recent labs, most recent imaging, and care everywhere.Aramis Zobel is a 74 y.o. female who presents today for her Complete Annual Exam. She feels well. She reports exercising . She reports she is sleeping well. Immunizations are reviewed and recommendations provided.   Age appropriate screening tests are discussed. Counseling given for risk factor reduction interventions. Patient will have labs upon return in 4 weeks for medication refills. 2. Cellulitis of right upper extremity Patient had a cellulitis secondary to a scratch from her dog.  Patient has been started on cephalexin 500 mg 3 times a day for 7 days and Bactroban 2% applied twice a day. - mupirocin ointment (BACTROBAN) 2 %; Apply 1 application topically 2 (two) times daily.  Dispense: 22 g; Refill: 0

## 2019-12-06 DIAGNOSIS — Z808 Family history of malignant neoplasm of other organs or systems: Secondary | ICD-10-CM | POA: Diagnosis not present

## 2019-12-06 DIAGNOSIS — L570 Actinic keratosis: Secondary | ICD-10-CM | POA: Diagnosis not present

## 2019-12-06 DIAGNOSIS — L578 Other skin changes due to chronic exposure to nonionizing radiation: Secondary | ICD-10-CM | POA: Diagnosis not present

## 2019-12-06 DIAGNOSIS — L821 Other seborrheic keratosis: Secondary | ICD-10-CM | POA: Diagnosis not present

## 2019-12-06 DIAGNOSIS — Z872 Personal history of diseases of the skin and subcutaneous tissue: Secondary | ICD-10-CM | POA: Diagnosis not present

## 2019-12-19 ENCOUNTER — Encounter: Payer: Self-pay | Admitting: Family Medicine

## 2019-12-19 ENCOUNTER — Ambulatory Visit (INDEPENDENT_AMBULATORY_CARE_PROVIDER_SITE_OTHER): Payer: Medicare Other | Admitting: Family Medicine

## 2019-12-19 ENCOUNTER — Other Ambulatory Visit: Payer: Self-pay

## 2019-12-19 VITALS — BP 100/62 | HR 60 | Ht 66.0 in | Wt 156.0 lb

## 2019-12-19 DIAGNOSIS — N393 Stress incontinence (female) (male): Secondary | ICD-10-CM

## 2019-12-19 DIAGNOSIS — E7849 Other hyperlipidemia: Secondary | ICD-10-CM

## 2019-12-19 DIAGNOSIS — F410 Panic disorder [episodic paroxysmal anxiety] without agoraphobia: Secondary | ICD-10-CM | POA: Diagnosis not present

## 2019-12-19 MED ORDER — SIMVASTATIN 20 MG PO TABS: ORAL_TABLET | ORAL | 1 refills | Status: DC

## 2019-12-19 MED ORDER — ALPRAZOLAM 0.25 MG PO TABS: ORAL_TABLET | ORAL | 5 refills | Status: DC

## 2019-12-19 MED ORDER — OXYBUTYNIN CHLORIDE ER 5 MG PO TB24: ORAL_TABLET | ORAL | 1 refills | Status: DC

## 2019-12-19 NOTE — Progress Notes (Signed)
Date:  12/19/2019   Name:  Melissa Daniels   DOB:  21-Dec-1945   MRN:  [id number]   Chief Complaint: overactive bladder, Hyperlipidemia, and Anxiety  Hyperlipidemia This is a chronic problem. The current episode started more than 1 year ago. The problem is controlled. Recent lipid tests were reviewed and are normal. She has no history of chronic renal disease, diabetes, hypothyroidism, liver disease, obesity or nephrotic syndrome. There are no known factors aggravating her hyperlipidemia. Pertinent negatives include no chest pain, focal sensory loss, focal weakness, leg pain, myalgias or shortness of breath. She is currently on no antihyperlipidemic treatment. The current treatment provides moderate improvement of lipids. There are no compliance problems.   Anxiety Presents for follow-up visit. Symptoms include excessive worry and nervous/anxious behavior. Patient reports no chest pain, compulsions, confusion, decreased concentration, depressed mood, dizziness, dry mouth, feeling of choking, hyperventilation, impotence, insomnia, irritability, malaise, muscle tension, nausea, obsessions, palpitations, panic, restlessness or shortness of breath. Symptoms occur occasionally. The severity of symptoms is mild. Nighttime awakenings: none.      Lab Results  Component Value Date   CREATININE 0.69 11/27/2018   BUN 14 11/27/2018   NA 142 11/27/2018   K 4.6 11/27/2018   CL 101 11/27/2018   CO2 26 11/27/2018   Lab Results  Component Value Date   CHOL 197 11/27/2018   HDL 56 11/27/2018   LDLCALC 120 (H) 11/27/2018   TRIG 105 11/27/2018   CHOLHDL 3.5 11/27/2018   No results found for: TSH No results found for: HGBA1C Lab Results  Component Value Date   WBC 5.1 08/13/2019   HGB 13.4 08/13/2019   HCT 40.2 08/13/2019   MCV 93 08/13/2019   PLT 279 08/13/2019   Lab Results  Component Value Date   ALT 18 11/27/2018   AST 23 11/27/2018   ALKPHOS 98 11/27/2018   BILITOT 0.5 11/27/2018      Review of Systems  Constitutional: Negative.  Negative for chills, fatigue, fever, irritability and unexpected weight change.  HENT: Negative for congestion, ear discharge, ear pain, postnasal drip, rhinorrhea, sinus pressure, sneezing and sore throat.   Eyes: Negative for photophobia, pain, discharge, redness and itching.  Respiratory: Negative for cough, shortness of breath, wheezing and stridor.   Cardiovascular: Negative for chest pain and palpitations.  Gastrointestinal: Negative for abdominal pain, blood in stool, constipation, diarrhea, nausea and vomiting.  Endocrine: Negative for cold intolerance, heat intolerance, polydipsia, polyphagia and polyuria.  Genitourinary: Negative for dysuria, flank pain, frequency, hematuria, impotence, menstrual problem, pelvic pain, urgency, vaginal bleeding and vaginal discharge.  Musculoskeletal: Negative for arthralgias, back pain and myalgias.  Skin: Negative for rash.  Allergic/Immunologic: Negative for environmental allergies and food allergies.  Neurological: Negative for dizziness, tremors, focal weakness, syncope, weakness, light-headedness, numbness and headaches.  Hematological: Negative for adenopathy. Does not bruise/bleed easily.  Psychiatric/Behavioral: Negative for confusion, decreased concentration and dysphoric mood. The patient is nervous/anxious. The patient does not have insomnia.     Patient Active Problem List   Diagnosis Date Noted  . Sixth (abducent) nerve palsy, 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  . Esotropia 03/26/2018  . Abscess of finger of right hand   . Abscess of left forearm   . Cellulitis 10/21/2017  . 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 Personal history of colonic polyps   . Benign neoplasm of cecum   . Benign neoplasm of transverse colon   . Benign neoplasm of ascending colon   . Malignant neoplasm of upper-outer quadrant of left breast in  female, estrogen receptor positive ([place]) 01/10/2017  . Neuropathy 10/31/2016  . Taking  medication for chronic disease 10/31/2016  . Primary osteoarthritis of right hip 10/31/2016  . 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 Menopause 10/19/2015  . Hormone replacement therapy (HRT) 10/19/2015  . 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 Routine general medical examination at a health care facility 10/10/2014  . 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 Female stress incontinence 10/10/2014  . Colon polyp 10/10/2014  . Episodic paroxysmal anxiety disorder 10/10/2014    Allergies  Allergen Reactions  . Aspirin   . Macrobid [Nitrofurantoin]   . 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 Sulfa Antibiotics Other (See Comments)    Platelets drop     Past Surgical History:  Procedure Laterality Date  . BREAST BIOPSY Left 12/28/2016   stereo path pend  . BREAST LUMPECTOMY    . COLONOSCOPY WITH PROPOFOL N/A 09/08/2017   Procedure: COLONOSCOPY WITH PROPOFOL;  Surgeon: Lucilla Lame, MD;  Location: [facility];  Service: Endoscopy;  Laterality: N/A;  . POLYPECTOMY  09/08/2017   Procedure: POLYPECTOMY;  Surgeon: Lucilla Lame, MD;  Location: [facility];  Service: Endoscopy;;  . SPLENECTOMY, TOTAL      Social History   Tobacco Use  . Smoking status: Never Smoker  . Smokeless tobacco: Never Used  . Tobacco comment: smoking cessation materials not required  Vaping Use  . Vaping Use: Never used  Substance Use Topics  . Alcohol use: No    Alcohol/week: 0.0 standard drinks  . Drug use: No     Medication list has been reviewed and updated.  Current Meds  Medication Sig  . ALPRAZolam (XANAX) 0.25 MG tablet TAKE (1) TABLET BY MOUTH EVERY DAY AS NEEDED  . Calcium Citrate-Vitamin D (CALCIUM + D PO) Take 1 tablet by mouth 2 (two) times daily.  . cholecalciferol (VITAMIN D3) 25 MCG (1000 UNIT) tablet Take 1,000 Units by  mouth daily.  . DULoxetine (CYMBALTA) 60 MG capsule Take 1 capsule by mouth 2 (two) times daily. [facility] doctor  . gabapentin (NEURONTIN) 300 MG capsule Take 1 capsule (300 mg total) by mouth at bedtime. (Patient taking differently: Take 300 mg by mouth at bedtime. [facility] doctor)  . Magnesium 400 MG CAPS Take 1 capsule by mouth 2 (two) times daily.   . Multiple Vitamins-Minerals (OCUVITE ADULT 50+ PO) Take 1 each by mouth 2 (two) times daily.  . mupirocin ointment (BACTROBAN) 2 % Apply 1 application topically 2 (two) times daily.  Marland Kitchen oxybutynin (DITROPAN-XL) 5 MG 24 hr tablet TAKE (1) TABLET BY MOUTH DAILY AT BEDTIME  . simvastatin (ZOCOR) 20 MG tablet TAKE (1) TABLET BY MOUTH EVERY DAY  . vitamin E 400 UNIT capsule Take 400 Units by mouth daily.    PHQ 2/9 Scores 12/19/2019 11/28/2019 08/28/2019 06/28/2019  PHQ - 2 Score 0 0 0 0  PHQ- 9 Score 1 2 - 0    GAD 7 : Generalized Anxiety Score 12/19/2019 11/28/2019 06/28/2019 03/29/2019  Nervous, Anxious, on Edge 0 0 0 0  Control/stop worrying 0 0 0 0  Worry too much - different things 0 0 0 0  Trouble relaxing 0 0 0 0  Restless 0 0 0 0  Easily annoyed or irritable 0 0 0 0  Afraid - awful might happen 0 0 0 0  Total GAD 7 Score 0  0 0 0  Anxiety Difficulty - - Not difficult at all -    BP Readings from Last 3 Encounters:  12/19/19 100/62  11/28/19 120/80  06/28/19 118/78    Physical Exam Vitals and nursing note reviewed.  Constitutional:      Appearance: She is well-developed.  HENT:     Head: Normocephalic.     Right Ear: Tympanic membrane, ear canal and external ear normal. There is no impacted cerumen.     Left Ear: Tympanic membrane, ear canal and external ear normal. There is no impacted cerumen.     Nose: Nose normal.     Mouth/Throat:     Mouth: Mucous membranes are moist.     Pharynx: Oropharynx is clear.  Eyes:     General: Lids are everted, no foreign bodies appreciated. No scleral icterus.       Left eye: No foreign body or  hordeolum.     Conjunctiva/sclera: Conjunctivae normal.     Right eye: Right conjunctiva is not injected.     Left eye: Left conjunctiva is not injected.     Pupils: Pupils are equal, round, and reactive to light.  Neck:     Thyroid: No thyromegaly.     Vascular: No JVD.     Trachea: No tracheal deviation.  Cardiovascular:     Rate and Rhythm: Normal rate and regular rhythm.     Heart sounds: Normal heart sounds. No murmur heard.  No friction rub. No gallop.   Pulmonary:     Effort: Pulmonary effort is normal. No respiratory distress.     Breath sounds: Normal breath sounds. No stridor. No wheezing, rhonchi or rales.  Chest:     Chest wall: No tenderness.  Abdominal:     General: Bowel sounds are normal. There is no distension.     Palpations: Abdomen is soft. There is no mass.     Tenderness: There is no abdominal tenderness. There is no guarding or rebound.     Hernia: No hernia is present.  Musculoskeletal:        General: No tenderness. Normal range of motion.     Cervical back: Normal range of motion and neck supple.  Lymphadenopathy:     Cervical: No cervical adenopathy.  Skin:    General: Skin is warm.     Capillary Refill: Capillary refill takes less than 2 seconds.     Findings: No bruising, erythema or rash.  Neurological:     Mental Status: She is alert and oriented to person, place, and time.     Cranial Nerves: No cranial nerve deficit.     Deep Tendon Reflexes: Reflexes normal.  Psychiatric:        Mood and Affect: Mood is not anxious or depressed.     Wt Readings from Last 3 Encounters:  12/19/19 156 lb (70.8 kg)  11/28/19 155 lb (70.3 kg)  08/28/19 155 lb (70.3 kg)    BP 100/62   Pulse 60   Ht 5\' 6"  (1.676 m)   Wt 156 lb (70.8 kg)   BMI 25.18 kg/m   Assessment and Plan:  1. Episodic paroxysmal anxiety disorder Chronic.  Episodic.  Stable.  Uncomplicated.  Continue alprazolam 0.25 1/2-1 every day as needed as needed panic concern. - ALPRAZolam  (XANAX) 0.25 MG tablet; TAKE (1) TABLET BY MOUTH EVERY DAY AS NEEDED  Dispense: 30 tablet; Refill: 5  2. 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  Controlled.  Stable.  Continue oxybutynin 5 mg XL  1 tablet at bedtime. - oxybutynin (DITROPAN-XL) 5 MG 24 hr tablet; TAKE (1) TABLET BY MOUTH DAILY AT BEDTIME  Dispense: 90 tablet; Refill: 1  3. Familial multiple lipoprotein-type hyperlipidemia Chronic.  Controlled.  Stable.  Continue simvastatin 20 mg once a day.  Will check lipid panel as well as CMP for hepatic toxicity. - simvastatin (ZOCOR) 20 MG tablet; TAKE (1) TABLET BY MOUTH EVERY DAY  Dispense: 90 tablet; Refill: 1 - Lipid Panel With LDL/HDL Ratio - Comprehensive Metabolic Panel (CMET)

## 2019-12-20 LAB — LIPID PANEL WITH LDL/HDL RATIO
Cholesterol, Total: 222 mg/dL — ABNORMAL HIGH (ref 100–199)
HDL: 53 mg/dL (ref >39)
LDL Chol Calc (NIH): 142 mg/dL — ABNORMAL HIGH (ref 0–99)
LDL/HDL Ratio: 2.7 ratio (ref 0.0–3.2)
Triglycerides: 151 mg/dL — ABNORMAL HIGH (ref 0–149)
VLDL Cholesterol Cal: 27 mg/dL (ref 5–40)

## 2019-12-20 LAB — COMPREHENSIVE METABOLIC PANEL
ALT: 20 IU/L (ref 0–32)
AST: 28 IU/L (ref 0–40)
Albumin/Globulin Ratio: 1.6 (ref 1.2–2.2)
Albumin: 4.5 g/dL (ref 3.7–4.7)
Alkaline Phosphatase: 87 IU/L (ref 48–121)
BUN/Creatinine Ratio: 14 (ref 12–28)
BUN: 12 mg/dL (ref 8–27)
Bilirubin Total: 0.5 mg/dL (ref 0.0–1.2)
CO2: 26 mmol/L (ref 20–29)
Calcium: 9.5 mg/dL (ref 8.7–10.3)
Chloride: 101 mmol/L (ref 96–106)
Creatinine, Ser: 0.86 mg/dL (ref 0.57–1.00)
GFR calc Af Amer: 77 mL/min/{1.73_m2} (ref >59)
GFR calc non Af Amer: 67 mL/min/{1.73_m2} (ref >59)
Globulin, Total: 2.8 g/dL (ref 1.5–4.5)
Glucose: 91 mg/dL (ref 65–99)
Potassium: 4.8 mmol/L (ref 3.5–5.2)
Sodium: 142 mmol/L (ref 134–144)
Total Protein: 7.3 g/dL (ref 6.0–8.5)

## 2020-01-15 ENCOUNTER — Ambulatory Visit: Payer: Self-pay | Admitting: *Deleted

## 2020-01-15 ENCOUNTER — Other Ambulatory Visit: Payer: Self-pay

## 2020-01-15 ENCOUNTER — Ambulatory Visit (INDEPENDENT_AMBULATORY_CARE_PROVIDER_SITE_OTHER): Payer: Medicare Other | Admitting: Family Medicine

## 2020-01-15 ENCOUNTER — Encounter: Payer: Self-pay | Admitting: Family Medicine

## 2020-01-15 ENCOUNTER — Ambulatory Visit: Payer: Medicare Other | Admitting: Family Medicine

## 2020-01-15 VITALS — BP 124/70 | HR 80 | Ht 66.0 in | Wt 152.0 lb

## 2020-01-15 DIAGNOSIS — M795 Residual foreign body in soft tissue: Secondary | ICD-10-CM

## 2020-01-15 NOTE — Telephone Encounter (Signed)
Summary: bump under skin   Patient requesting to speak with nurse regarding a healed scratch on right arm that has developed a bump underneath.      July 1- patient was given ointment- for scratch on skin from dog. Patient report she has a pea sized hard area in the sratch that is tender is a hard place- sensitive an white. Patient states it does not look infected- but would like someone to check it.  Reason for Disposition  [1] After 14 days AND [2] wound isn't healed  Answer Assessment - Initial Assessment Questions 1. APPEARANCE of INJURY: "What does the injury look like?"      White and crusty- not inflamed, scratch is healed except on same area. 2. SIZE: "How large is the cut?"      Pea size 3. BLEEDING: "Is it bleeding now?" If Yes, ask: "Is it difficult to stop?"      no 4. LOCATION: "Where is the injury located?"      R arm 5. ONSET: "How long ago did the injury occur?"      Bump has been present 7/1 6. MECHANISM: "Tell me how it happened."      Scratch from dog 7. TETANUS: "When was the last tetanus booster?"     Current  8. PREGNANCY: "Is there any chance you are pregnant?" "When was your last menstrual period?"     n/a  Protocols used: [place]

## 2020-01-15 NOTE — Progress Notes (Signed)
Date:  01/15/2020   Name:  Melissa Daniels   DOB:  Mar 30, 1946   MRN:  [id number]   Chief Complaint: sore on skin (Right arm- been there for a littler over a month. Painful but not itchy. )  Patient is a 74 year old female who presents for a puncture wound exam. The patient reports the following problems: eschar. Health maintenance has been reviewed up to date.   Lab Results  Component Value Date   CREATININE 0.86 12/19/2019   BUN 12 12/19/2019   NA 142 12/19/2019   K 4.8 12/19/2019   CL 101 12/19/2019   CO2 26 12/19/2019   Lab Results  Component Value Date   CHOL 222 (H) 12/19/2019   HDL 53 12/19/2019   LDLCALC 142 (H) 12/19/2019   TRIG 151 (H) 12/19/2019   CHOLHDL 3.5 11/27/2018   No results found for: TSH No results found for: HGBA1C Lab Results  Component Value Date   WBC 5.1 08/13/2019   HGB 13.4 08/13/2019   HCT 40.2 08/13/2019   MCV 93 08/13/2019   PLT 279 08/13/2019   Lab Results  Component Value Date   ALT 20 12/19/2019   AST 28 12/19/2019   ALKPHOS 87 12/19/2019   BILITOT 0.5 12/19/2019     Review of Systems  Constitutional: Negative.  Negative for chills, fatigue, fever and unexpected weight change.  HENT: Negative for congestion, ear discharge, ear pain, rhinorrhea, sinus pressure, sneezing and sore throat.   Eyes: Negative for photophobia, pain, discharge, redness and itching.  Respiratory: Negative for cough, shortness of breath, wheezing and stridor.   Gastrointestinal: Negative for abdominal pain, blood in stool, constipation, diarrhea, nausea and vomiting.  Endocrine: Negative for cold intolerance, heat intolerance, polydipsia, polyphagia and polyuria.  Genitourinary: Negative for dysuria, flank pain, frequency, hematuria, menstrual problem, pelvic pain, urgency, vaginal bleeding and vaginal discharge.  Musculoskeletal: Negative for arthralgias, back pain and myalgias.  Skin: Negative for rash.  Allergic/Immunologic: Negative for environmental  allergies and food allergies.  Neurological: Negative for dizziness, weakness, light-headedness, numbness and headaches.  Hematological: Negative for adenopathy. Does not bruise/bleed easily.  Psychiatric/Behavioral: Negative for dysphoric mood. The patient is not nervous/anxious.     Patient Active Problem List   Diagnosis Date Noted  . Sixth (abducent) nerve palsy, 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  . Esotropia 03/26/2018  . Abscess of finger of right hand   . Abscess of left forearm   . Cellulitis 10/21/2017  . 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 Personal history of colonic polyps   . Benign neoplasm of cecum   . Benign neoplasm of transverse colon   . Benign neoplasm of ascending colon   . Malignant neoplasm of upper-outer quadrant of left breast in female, estrogen receptor positive ([place]) 01/10/2017  . Neuropathy 10/31/2016  . Taking medication for chronic disease 10/31/2016  . Primary osteoarthritis of right hip 10/31/2016  . 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[place]) 10/19/2015  . Menopause 10/19/2015  . Hormone replacement therapy (HRT) 10/19/2015  . 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 Routine general medical examination at a health care facility 10/10/2014  . 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 Female stress incontinence 10/10/2014  . Colon polyp 10/10/2014  . Episodic paroxysmal anxiety disorder 10/10/2014    Allergies  Allergen Reactions  . Aspirin   . Macrobid [Nitrofurantoin]   . 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  Sulfa Antibiotics Other (See Comments)    Platelets drop     Past Surgical History:  Procedure Laterality Date  . BREAST BIOPSY Left 12/28/2016   stereo path pend  . BREAST LUMPECTOMY    . COLONOSCOPY WITH PROPOFOL N/A 09/08/2017   Procedure: COLONOSCOPY WITH PROPOFOL;  Surgeon: Lucilla Lame, MD;  Location: [facility];   Service: Endoscopy;  Laterality: N/A;  . POLYPECTOMY  09/08/2017   Procedure: POLYPECTOMY;  Surgeon: Lucilla Lame, MD;  Location: [facility];  Service: Endoscopy;;  . SPLENECTOMY, TOTAL      Social History   Tobacco Use  . Smoking status: Never Smoker  . Smokeless tobacco: Never Used  . Tobacco comment: smoking cessation materials not required  Vaping Use  . Vaping Use: Never used  Substance Use Topics  . Alcohol use: No    Alcohol/week: 0.0 standard drinks  . Drug use: No     Medication list has been reviewed and updated.  Current Meds  Medication Sig  . ALPRAZolam (XANAX) 0.25 MG tablet TAKE (1) TABLET BY MOUTH EVERY DAY AS NEEDED  . Calcium Citrate-Vitamin D (CALCIUM + D PO) Take 1 tablet by mouth 2 (two) times daily.  . cholecalciferol (VITAMIN D3) 25 MCG (1000 UNIT) tablet Take 1,000 Units by mouth daily.  . DULoxetine (CYMBALTA) 60 MG capsule Take 1 capsule by mouth 2 (two) times daily. [facility] doctor  . gabapentin (NEURONTIN) 300 MG capsule Take 1 capsule (300 mg total) by mouth at bedtime. (Patient taking differently: Take 300 mg by mouth at bedtime. [facility] doctor)  . Magnesium 400 MG CAPS Take 1 capsule by mouth 2 (two) times daily.   . Multiple Vitamins-Minerals (OCUVITE ADULT 50+ PO) Take 1 each by mouth 2 (two) times daily.  . mupirocin ointment (BACTROBAN) 2 % Apply 1 application topically 2 (two) times daily.  Marland Kitchen oxybutynin (DITROPAN-XL) 5 MG 24 hr tablet TAKE (1) TABLET BY MOUTH DAILY AT BEDTIME  . simvastatin (ZOCOR) 20 MG tablet TAKE (1) TABLET BY MOUTH EVERY DAY  . vitamin E 400 UNIT capsule Take 400 Units by mouth daily.    PHQ 2/9 Scores 01/15/2020 12/19/2019 11/28/2019 08/28/2019  PHQ - 2 Score 0 0 0 0  PHQ- 9 Score 0 1 2 -    GAD 7 : Generalized Anxiety Score 12/19/2019 11/28/2019 06/28/2019 03/29/2019  Nervous, Anxious, on Edge 0 0 0 0  Control/stop worrying 0 0 0 0  Worry too much - different things 0 0 0 0  Trouble relaxing 0 0 0 0  Restless 0 0 0 0    Easily annoyed or irritable 0 0 0 0  Afraid - awful might happen 0 0 0 0  Total GAD 7 Score 0 0 0 0  Anxiety Difficulty - - Not difficult at all -    BP Readings from Last 3 Encounters:  01/15/20 124/70  12/19/19 100/62  11/28/19 120/80    Physical Exam Vitals and nursing note reviewed.  Constitutional:      General: She is not in acute distress.    Appearance: She is not diaphoretic.  HENT:     Head: Normocephalic and atraumatic.     Right Ear: Tympanic membrane, ear canal and external ear normal.     Left Ear: Tympanic membrane, ear canal and external ear normal.     Nose: Nose normal.  Eyes:     General:        Right eye: No discharge.  Left eye: No discharge.     Conjunctiva/sclera: Conjunctivae normal.     Pupils: Pupils are equal, round, and reactive to light.  Neck:     Thyroid: No thyromegaly.     Vascular: No JVD.  Cardiovascular:     Rate and Rhythm: Normal rate and regular rhythm.     Heart sounds: Normal heart sounds. No murmur heard.  No friction rub. No gallop.   Pulmonary:     Effort: Pulmonary effort is normal.     Breath sounds: Normal breath sounds.  Abdominal:     General: Bowel sounds are normal.     Palpations: Abdomen is soft. There is no mass.     Tenderness: There is no abdominal tenderness. There is no guarding.  Musculoskeletal:        General: Normal range of motion.     Cervical back: Normal range of motion and neck supple.  Lymphadenopathy:     Cervical: No cervical adenopathy.  Skin:    General: Skin is warm and dry.     Findings: Lesion present. No erythema.     Comments: 1cmx1cmx.5cm hypertrophic/ right forearm  Neurological:     Mental Status: She is alert.     Deep Tendon Reflexes: Reflexes are normal and symmetric.     Wt Readings from Last 3 Encounters:  01/15/20 152 lb (68.9 kg)  12/19/19 156 lb (70.8 kg)  11/28/19 155 lb (70.3 kg)    BP 124/70   Pulse 80   Ht 5\' 6"  (1.676 m)   Wt 152 lb (68.9 kg)   SpO2  97%   BMI 24.53 kg/m   Assessment and Plan: 1. Foreign body (FB) in soft tissue Patient sustained an injury to her right forearm the latter part of June.  This occurred when her dog jumped up on her and her paw punctured the skin and scratched her distally.  Initially was treated with cephalexin and Bactroban.  The scratch healed well but the area of puncture produced a 1 cm x 1 cm 0.5 cm area of granulated tissue/hypertrophic.  I suspect there may have been a foreign body that caused a foreign body reaction of overgrowth of scar tissue.  We will refer to dermatology for evaluation and possible scar revision with excision of the granulation tissue. - Ambulatory referral to Dermatology

## 2020-01-21 DIAGNOSIS — C44622 Squamous cell carcinoma of skin of right upper limb, including shoulder: Secondary | ICD-10-CM | POA: Diagnosis not present

## 2020-01-21 DIAGNOSIS — D485 Neoplasm of uncertain behavior of skin: Secondary | ICD-10-CM | POA: Diagnosis not present

## 2020-01-31 ENCOUNTER — Telehealth: Payer: Self-pay | Admitting: Family Medicine

## 2020-01-31 NOTE — Telephone Encounter (Signed)
Faxed Rx for mammo to [phone]- Diag bilateral mammo Hx lumpectomy and Z10.0 malignant neoplasm of upper outer quadrant L) breast female

## 2020-01-31 NOTE — Telephone Encounter (Signed)
Copied from [place] [id number]. Topic: General - Inquiry >> Jan 31, 2020 12:01 PM Percell Belt A wrote: Reason for CRM: pt called in and stated she needs to get her yearly mammogram.  She would like order sent to [facility]  .  [employer] /  Best number [phone]

## 2020-02-04 ENCOUNTER — Ambulatory Visit: Payer: Medicare Other | Admitting: Family Medicine

## 2020-02-12 DIAGNOSIS — C44622 Squamous cell carcinoma of skin of right upper limb, including shoulder: Secondary | ICD-10-CM | POA: Diagnosis not present

## 2020-03-10 DIAGNOSIS — Z853 Personal history of malignant neoplasm of breast: Secondary | ICD-10-CM | POA: Diagnosis not present

## 2020-03-10 DIAGNOSIS — R922 Inconclusive mammogram: Secondary | ICD-10-CM | POA: Diagnosis not present

## 2020-04-15 DIAGNOSIS — H5005 Alternating esotropia: Secondary | ICD-10-CM | POA: Diagnosis not present

## 2020-04-15 DIAGNOSIS — H2513 Age-related nuclear cataract, bilateral: Secondary | ICD-10-CM | POA: Diagnosis not present

## 2020-04-15 DIAGNOSIS — R930 Abnormal findings on diagnostic imaging of skull and head, not elsewhere classified: Secondary | ICD-10-CM | POA: Diagnosis not present

## 2020-04-15 DIAGNOSIS — H04123 Dry eye syndrome of bilateral lacrimal glands: Secondary | ICD-10-CM | POA: Diagnosis not present

## 2020-04-20 ENCOUNTER — Other Ambulatory Visit: Payer: Self-pay | Admitting: Family Medicine

## 2020-04-20 DIAGNOSIS — F410 Panic disorder [episodic paroxysmal anxiety] without agoraphobia: Secondary | ICD-10-CM

## 2020-04-20 NOTE — Telephone Encounter (Signed)
Requested medication (s) are due for refill today-no- should have RF  Requested medication (s) are on the active medication list -yes  Future visit scheduled -yes  Last refill: 11/22/19  Notes to clinic: Request non delegated Rx- Rx 12/19/19 5 RF  Requested Prescriptions  Pending Prescriptions Disp Refills   ALPRAZolam (XANAX) 0.25 MG tablet [Pharmacy Med Name: ALPRAZOLAM 0.25 MG TAB] 30 tablet     Sig: TAKE (1) TABLET BY MOUTH EVERY DAY AS NEEDED      Not Delegated - Psychiatry:  Anxiolytics/Hypnotics Failed - 04/20/2020  8:09 AM      Failed - This refill cannot be delegated      Failed - Urine Drug Screen completed in last 360 days      Passed - Valid encounter within last 6 months    Recent Outpatient Visits           3 months ago Foreign body (FB) in soft tissue   [facility] Juline Patch, MD   4 months ago Episodic paroxysmal anxiety disorder   [facility] Juline Patch, MD   4 months ago Annual physical exam   [facility] Juline Patch, MD   9 months ago Episodic paroxysmal anxiety disorder   [facility] Juline Patch, MD   11 months ago Palpitations   [facility] Juline Patch, MD       Future Appointments             In 1 month Juline Patch, MD [facility], [place]                Requested Prescriptions  Pending Prescriptions Disp Refills   ALPRAZolam (XANAX) 0.25 MG tablet [Pharmacy Med Name: ALPRAZOLAM 0.25 MG TAB] 30 tablet     Sig: TAKE (1) TABLET BY MOUTH EVERY DAY AS NEEDED      Not Delegated - Psychiatry:  Anxiolytics/Hypnotics Failed - 04/20/2020  8:09 AM      Failed - This refill cannot be delegated      Failed - Urine Drug Screen completed in last 360 days      Passed - Valid encounter within last 6 months    Recent Outpatient Visits           3 months ago Foreign body (FB) in soft tissue   [facility] Juline Patch, MD   4 months ago Episodic  paroxysmal anxiety disorder   [facility] Juline Patch, MD   4 months ago Annual physical exam   [facility] Juline Patch, MD   9 months ago Episodic paroxysmal anxiety disorder   [facility] Juline Patch, MD   11 months ago Palpitations   [facility] Juline Patch, MD       Future Appointments             In 1 month Juline Patch, MD [facility], [facility]

## 2020-06-01 DIAGNOSIS — R9089 Other abnormal findings on diagnostic imaging of central nervous system: Secondary | ICD-10-CM | POA: Diagnosis not present

## 2020-06-01 DIAGNOSIS — H532 Diplopia: Secondary | ICD-10-CM | POA: Diagnosis not present

## 2020-06-03 ENCOUNTER — Ambulatory Visit (INDEPENDENT_AMBULATORY_CARE_PROVIDER_SITE_OTHER): Payer: Medicare Other | Admitting: Family Medicine

## 2020-06-03 ENCOUNTER — Other Ambulatory Visit: Payer: Self-pay

## 2020-06-03 ENCOUNTER — Encounter: Payer: Self-pay | Admitting: Family Medicine

## 2020-06-03 VITALS — BP 112/70 | HR 80 | Ht 66.0 in | Wt 146.0 lb

## 2020-06-03 DIAGNOSIS — E7849 Other hyperlipidemia: Secondary | ICD-10-CM | POA: Diagnosis not present

## 2020-06-03 DIAGNOSIS — D693 Immune thrombocytopenic purpura: Secondary | ICD-10-CM | POA: Diagnosis not present

## 2020-06-03 DIAGNOSIS — F410 Panic disorder [episodic paroxysmal anxiety] without agoraphobia: Secondary | ICD-10-CM | POA: Diagnosis not present

## 2020-06-03 MED ORDER — SIMVASTATIN 20 MG PO TABS
ORAL_TABLET | ORAL | 1 refills | Status: DC
Start: 2020-06-03 — End: 2020-11-06

## 2020-06-03 MED ORDER — ALPRAZOLAM 0.25 MG PO TABS: ORAL_TABLET | ORAL | 5 refills | Status: DC

## 2020-06-03 NOTE — Progress Notes (Signed)
Date:  06/03/2020   Name:  Melissa Daniels   DOB:  02-Jul-1945   MRN:  [id number]   Chief Complaint: Anxiety, Hyperlipidemia, and Urinary Incontinence  Anxiety Presents for follow-up visit. Symptoms include nervous/anxious behavior. Patient reports no chest pain, compulsions, confusion, decreased concentration, depressed mood, dizziness, dry mouth, excessive worry, feeling of choking, hyperventilation, impotence, insomnia, irritability, malaise, muscle tension, nausea, obsessions, palpitations, panic, restlessness, shortness of breath or suicidal ideas. Symptoms occur rarely. The severity of symptoms is mild.    Hyperlipidemia This is a chronic problem. The current episode started more than 1 year ago. The problem is controlled. Recent lipid tests were reviewed and are normal. She has no history of chronic renal disease, diabetes, hypothyroidism, liver disease, obesity or nephrotic syndrome. There are no known factors aggravating her hyperlipidemia. Pertinent negatives include no chest pain, focal sensory loss, focal weakness, leg pain, myalgias or shortness of breath. Current antihyperlipidemic treatment includes statins. The current treatment provides mild improvement of lipids. There are no compliance problems.  Risk factors for coronary artery disease include dyslipidemia.    Lab Results  Component Value Date   CREATININE 0.86 12/19/2019   BUN 12 12/19/2019   NA 142 12/19/2019   K 4.8 12/19/2019   CL 101 12/19/2019   CO2 26 12/19/2019   Lab Results  Component Value Date   CHOL 222 (H) 12/19/2019   HDL 53 12/19/2019   LDLCALC 142 (H) 12/19/2019   TRIG 151 (H) 12/19/2019   CHOLHDL 3.5 11/27/2018   No results found for: TSH No results found for: HGBA1C Lab Results  Component Value Date   WBC 5.1 08/13/2019   HGB 13.4 08/13/2019   HCT 40.2 08/13/2019   MCV 93 08/13/2019   PLT 279 08/13/2019   Lab Results  Component Value Date   ALT 20 12/19/2019   AST 28 12/19/2019   ALKPHOS  87 12/19/2019   BILITOT 0.5 12/19/2019     Review of Systems  Constitutional: Negative.  Negative for chills, fatigue, fever, irritability and unexpected weight change.  HENT: Negative for congestion, ear discharge, ear pain, rhinorrhea, sinus pressure, sneezing and sore throat.   Eyes: Negative for double vision, photophobia, pain, discharge, redness and itching.  Respiratory: Negative for cough, shortness of breath, wheezing and stridor.   Cardiovascular: Negative for chest pain and palpitations.  Gastrointestinal: Negative for abdominal pain, blood in stool, constipation, diarrhea, nausea and vomiting.  Endocrine: Negative for cold intolerance, heat intolerance, polydipsia, polyphagia and polyuria.  Genitourinary: Negative for dysuria, flank pain, frequency, hematuria, impotence, menstrual problem, pelvic pain, urgency, vaginal bleeding and vaginal discharge.  Musculoskeletal: Negative for arthralgias, back pain and myalgias.  Skin: Negative for rash.  Allergic/Immunologic: Negative for environmental allergies and food allergies.  Neurological: Negative for dizziness, focal weakness, weakness, light-headedness, numbness and headaches.  Hematological: Negative for adenopathy. Does not bruise/bleed easily.  Psychiatric/Behavioral: Negative for confusion, decreased concentration, dysphoric mood and suicidal ideas. The patient is nervous/anxious. The patient does not have insomnia.     Patient Active Problem List   Diagnosis Date Noted  . Sixth (abducent) nerve palsy, 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  . Esotropia 03/26/2018  . Abscess of finger of right hand   . Abscess of left forearm   . Cellulitis 10/21/2017  . 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 Personal history of colonic polyps   . Benign neoplasm of cecum   . Benign neoplasm of transverse colon   . Benign neoplasm of ascending colon   . Malignant neoplasm of  upper-outer quadrant of left breast in female, estrogen receptor positive ([place]) 01/10/2017  .  Neuropathy 10/31/2016  . Taking medication for chronic disease 10/31/2016  . Primary osteoarthritis of right hip 10/31/2016  . 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 Menopause 10/19/2015  . Hormone replacement therapy (HRT) 10/19/2015  . 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 Routine general medical examination at a health care facility 10/10/2014  . 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 Female stress incontinence 10/10/2014  . Colon polyp 10/10/2014  . Episodic paroxysmal anxiety disorder 10/10/2014    Allergies  Allergen Reactions  . Aspirin   . Macrobid [Nitrofurantoin]   . 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 Sulfa Antibiotics Other (See Comments)    Platelets drop     Past Surgical History:  Procedure Laterality Date  . BREAST BIOPSY Left 12/28/2016   stereo path pend  . BREAST LUMPECTOMY    . COLONOSCOPY WITH PROPOFOL N/A 09/08/2017   Procedure: COLONOSCOPY WITH PROPOFOL;  Surgeon: Lucilla Lame, MD;  Location: [facility];  Service: Endoscopy;  Laterality: N/A;  . POLYPECTOMY  09/08/2017   Procedure: POLYPECTOMY;  Surgeon: Lucilla Lame, MD;  Location: [facility];  Service: Endoscopy;;  . SPLENECTOMY, TOTAL      Social History   Tobacco Use  . Smoking status: Never Smoker  . Smokeless tobacco: Never Used  . Tobacco comment: smoking cessation materials not required  Vaping Use  . Vaping Use: Never used  Substance Use Topics  . Alcohol use: No    Alcohol/week: 0.0 standard drinks  . Drug use: No     Medication list has been reviewed and updated.  Current Meds  Medication Sig  . ALPRAZolam (XANAX) 0.25 MG tablet TAKE (1) TABLET BY MOUTH EVERY DAY AS NEEDED  . Calcium Citrate-Vitamin D (CALCIUM + D PO) Take 1 tablet by mouth 2 (two) times daily.  . cholecalciferol (VITAMIN D3) 25 MCG (1000  UNIT) tablet Take 1,000 Units by mouth daily.  . Coenzyme Q10-Fish Oil-Vit E (CO-Q 10 OMEGA-3 FISH OIL PO) Take by mouth daily.  . DULoxetine (CYMBALTA) 60 MG capsule Take 1 capsule by mouth 2 (two) times daily. [facility] doctor  . gabapentin (NEURONTIN) 300 MG capsule Take 1 capsule (300 mg total) by mouth at bedtime. (Patient taking differently: Take 300 mg by mouth in the morning and at bedtime. [facility] doctor- morning and night)  . Magnesium 400 MG CAPS Take 1 capsule by mouth 2 (two) times daily.   . Multiple Vitamins-Minerals (OCUVITE ADULT 50+ PO) Take 1 each by mouth 2 (two) times daily.  Marland Kitchen oxybutynin (DITROPAN-XL) 5 MG 24 hr tablet TAKE (1) TABLET BY MOUTH DAILY AT BEDTIME  . simvastatin (ZOCOR) 20 MG tablet TAKE (1) TABLET BY MOUTH EVERY DAY  . vitamin E 400 UNIT capsule Take 400 Units by mouth daily.    PHQ 2/9 Scores 06/03/2020 01/15/2020 12/19/2019 11/28/2019  PHQ - 2 Score 0 0 0 0  PHQ- 9 Score 0 0 1 2    GAD 7 : Generalized Anxiety Score 06/03/2020 12/19/2019 11/28/2019 06/28/2019  Nervous, Anxious, on Edge 1 0 0 0  Control/stop worrying 0 0 0 0  Worry too much - different things 0 0 0 0  Trouble relaxing 0 0 0 0  Restless 0 0 0 0  Easily annoyed or irritable 0 0 0 0  Afraid -  awful might happen 0 0 0 0  Total GAD 7 Score 1 0 0 0  Anxiety Difficulty - - - Not difficult at all    BP Readings from Last 3 Encounters:  06/03/20 112/70  01/15/20 124/70  12/19/19 100/62    Physical Exam Vitals and nursing note reviewed.  Constitutional:      Appearance: She is well-developed and well-nourished.  HENT:     Head: Normocephalic.     Right Ear: Tympanic membrane, ear canal and external ear normal. There is no impacted cerumen.     Left Ear: Tympanic membrane, ear canal and external ear normal. There is no impacted cerumen.     Nose: Nose normal.     Mouth/Throat:     Mouth: Oropharynx is clear and moist. Mucous membranes are moist.  Eyes:     General: Lids are everted, no foreign  bodies appreciated. No scleral icterus.       Left eye: No foreign body or hordeolum.     Extraocular Movements: EOM normal.     Conjunctiva/sclera: Conjunctivae normal.     Right eye: Right conjunctiva is not injected.     Left eye: Left conjunctiva is not injected.     Pupils: Pupils are equal, round, and reactive to light.  Neck:     Thyroid: No thyromegaly.     Vascular: No JVD.     Trachea: No tracheal deviation.  Cardiovascular:     Rate and Rhythm: Normal rate and regular rhythm.     Pulses: Intact distal pulses.     Heart sounds: Normal heart sounds. No murmur heard. No friction rub. No gallop.   Pulmonary:     Effort: Pulmonary effort is normal. No respiratory distress.     Breath sounds: Normal breath sounds. No wheezing, rhonchi or rales.  Abdominal:     General: Bowel sounds are normal.     Palpations: Abdomen is soft. There is no hepatosplenomegaly or mass.     Tenderness: There is no abdominal tenderness. There is no guarding or rebound.  Musculoskeletal:        General: No tenderness or edema. Normal range of motion.     Cervical back: Normal range of motion and neck supple.  Lymphadenopathy:     Cervical: No cervical adenopathy.  Skin:    General: Skin is warm.     Findings: No rash.  Neurological:     Mental Status: She is alert and oriented to person, place, and time.     Cranial Nerves: No cranial nerve deficit.     Deep Tendon Reflexes: Strength normal. Reflexes normal.  Psychiatric:        Mood and Affect: Mood and affect normal. Mood is not anxious or depressed.     Wt Readings from Last 3 Encounters:  06/03/20 146 lb (66.2 kg)  01/15/20 152 lb (68.9 kg)  12/19/19 156 lb (70.8 kg)    BP 112/70   Pulse 80   Ht 5\' 6"  (1.676 m)   Wt 146 lb (66.2 kg)   BMI 23.57 kg/m   Assessment and Plan: 1. Familial multiple lipoprotein-type hyperlipidemia Chronic.  Controlled.  Stable.  Patient is doing well on simvastatin 20 mg once a day.  And we will  check lipid panel for current status of control. - simvastatin (ZOCOR) 20 MG tablet; TAKE (1) TABLET BY MOUTH EVERY DAY  Dispense: 90 tablet; Refill: 1 - Lipid Panel With LDL/HDL Ratio  2. Episodic paroxysmal anxiety disorder Chronic.  Controlled.  Stable.  Gad score is 1./Feeling nervous and anxious.  Patient has been given alprazolam 0.25 to use as needed once a day. - ALPRAZolam (XANAX) 0.25 MG tablet; TAKE (1) TABLET BY MOUTH EVERY DAY AS NEEDED  Dispense: 30 tablet; Refill: 5  3. 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HCC) Chronic.  Controlled.  Stable.  We will check CBC for the current status of platelet count. - CBC with Differential/Platelet  4. Hypocalcemia Patient had 1 remote minimally low calcium serum level.  We will check a renal function panel to confirm that this is returned to normal as it has done on the most recent as well as the phosphorus level. - Renal Function Panel

## 2020-06-04 ENCOUNTER — Telehealth: Payer: Self-pay | Admitting: Family Medicine

## 2020-06-04 LAB — RENAL FUNCTION PANEL
Albumin: 4.4 g/dL (ref 3.7–4.7)
BUN/Creatinine Ratio: 16 (ref 12–28)
BUN: 13 mg/dL (ref 8–27)
CO2: 27 mmol/L (ref 20–29)
Calcium: 9.8 mg/dL (ref 8.7–10.3)
Chloride: 101 mmol/L (ref 96–106)
Creatinine, Ser: 0.79 mg/dL (ref 0.57–1.00)
GFR calc Af Amer: 85 mL/min/{1.73_m2} (ref >59)
GFR calc non Af Amer: 74 mL/min/{1.73_m2} (ref >59)
Glucose: 94 mg/dL (ref 65–99)
Phosphorus: 3.5 mg/dL (ref 3.0–4.3)
Potassium: 4.7 mmol/L (ref 3.5–5.2)
Sodium: 143 mmol/L (ref 134–144)

## 2020-06-04 NOTE — Telephone Encounter (Signed)
Pt states she got her flu shot 02/26/20. Also would like a copy of her lab results.

## 2020-06-04 NOTE — Telephone Encounter (Signed)
The flu shot is in her chart- We are still waiting on part of her labs

## 2020-06-22 DIAGNOSIS — G629 Polyneuropathy, unspecified: Secondary | ICD-10-CM | POA: Diagnosis not present

## 2020-06-22 DIAGNOSIS — R9089 Other abnormal findings on diagnostic imaging of central nervous system: Secondary | ICD-10-CM | POA: Diagnosis not present

## 2020-06-22 DIAGNOSIS — H532 Diplopia: Secondary | ICD-10-CM | POA: Diagnosis not present

## 2020-07-21 DIAGNOSIS — Z872 Personal history of diseases of the skin and subcutaneous tissue: Secondary | ICD-10-CM | POA: Diagnosis not present

## 2020-07-21 DIAGNOSIS — Z859 Personal history of malignant neoplasm, unspecified: Secondary | ICD-10-CM | POA: Diagnosis not present

## 2020-07-21 DIAGNOSIS — L578 Other skin changes due to chronic exposure to nonionizing radiation: Secondary | ICD-10-CM | POA: Diagnosis not present

## 2020-07-28 IMAGING — MR MR HEAD WO/W CM
12 series · 48 of 48 positions shown · IV contrast (multihance)
Comparison: None.

CLINICAL DATA: Double vision for 1 week. History of breast
lumpectomy in 4817

EXAM:
MRI HEAD WITHOUT AND WITH CONTRAST
TECHNIQUE: Multiplanar, multiecho pulse sequences of the brain and surrounding
structures were obtained without and with intravenous contrast.
CONTRAST:  13mL MULTIHANCE GADOBENATE DIMEGLUMINE 529 MG/ML IV SOLN

[Series 3: DWI · axial · 3.0mm · 1.20mm/px · z∈[-52,+109]mm · 3 of 53 slices shown (1 of 4)]
[im 1/53]
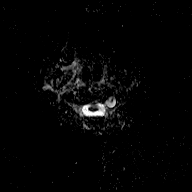
[im 27/53]
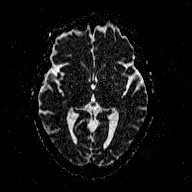
[im 53/53]
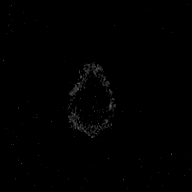

[Series 5: DWI · coronal · 3.0mm · 1.15mm/px · 2 of 49 slices shown (2 of 4)]
[im 1/49]
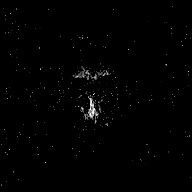
[im 49/49]
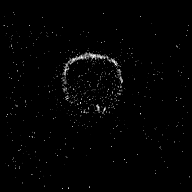

[Series 6: T1 · sagittal · 5.0mm · 0.45mm/px · 2 of 23 slices shown (1 of 2)]
[im 1/23]
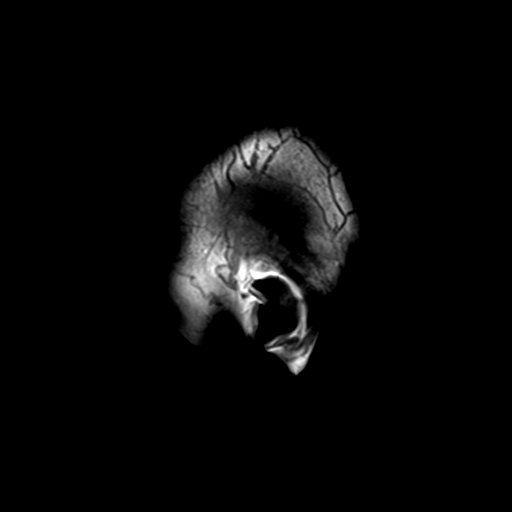
[im 23/23]
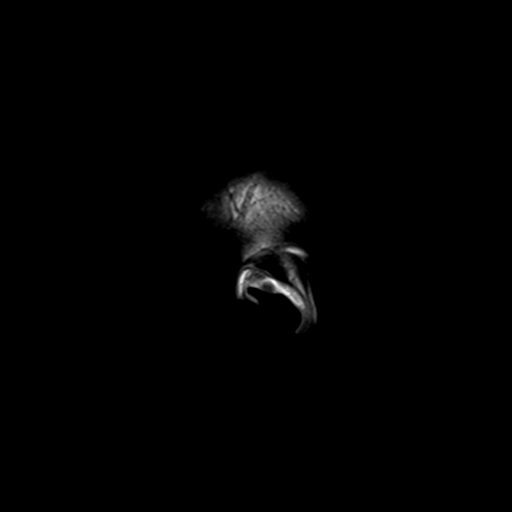

[Series 7: T2 · axial · 5.0mm · 0.72mm/px · z∈[-51,+102]mm · 2 of 23 slices shown (1 of 2)]
[im 1/23]
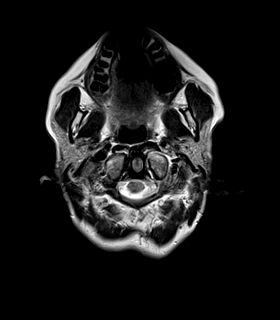
[im 23/23]
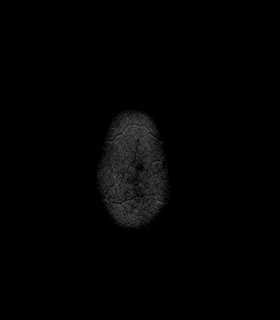

[Series 8: FLAIR · axial · 3.0mm · 0.45mm/px · z∈[-54,+106]mm · 4 of 55 slices shown]
[im 1/55]
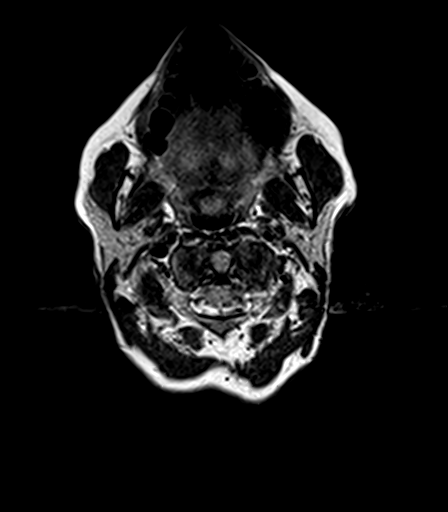
[im 19/55]
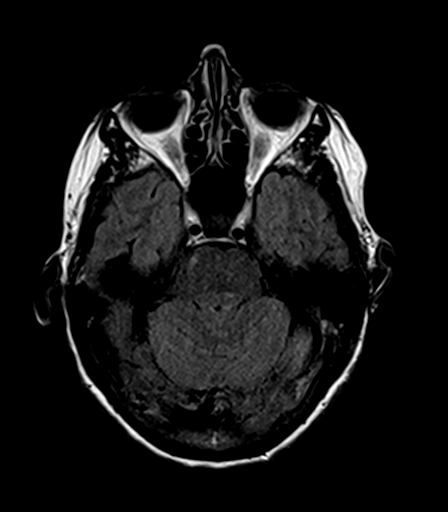
[im 37/55]
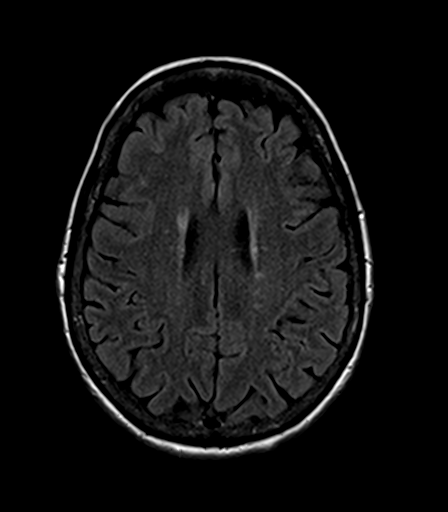
[im 55/55]
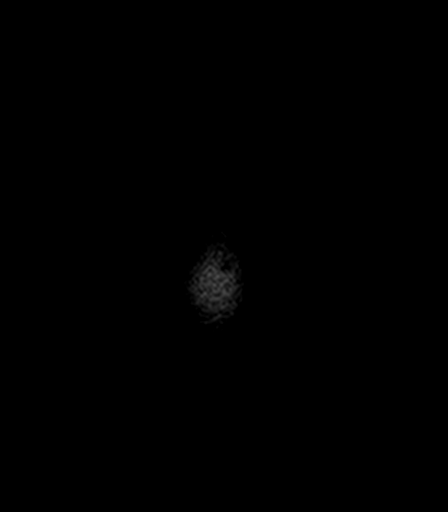

[Series 9: T2 · axial · 5.0mm · 0.72mm/px · z∈[-51,+102]mm · 2 of 23 slices shown (2 of 2)]
[im 1/23]
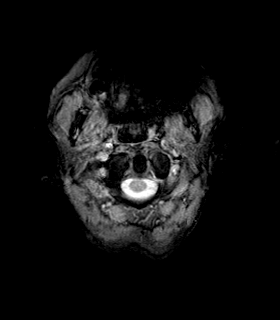
[im 23/23]
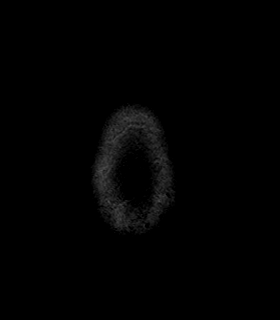

[Series 10: T1 · axial · 1.0mm · 1.00mm/px · z∈[-55,+102]mm · 11 of 160 slices shown (2 of 2)]
[im 1/160]
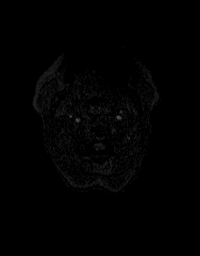
[im 16/160]
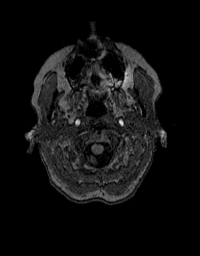
[im 32/160]
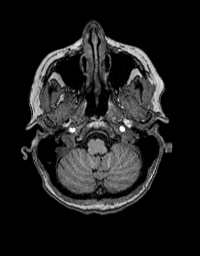
[im 48/160]
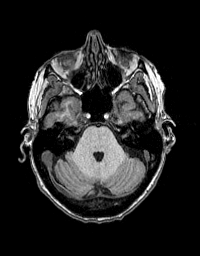
[im 64/160]
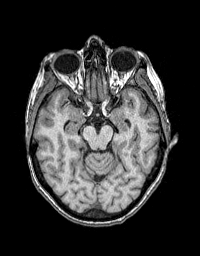
[im 80/160]
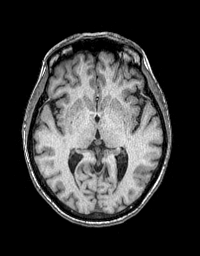
[im 96/160]
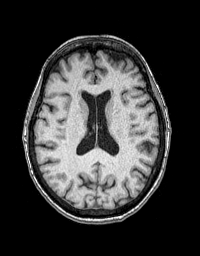
[im 112/160]
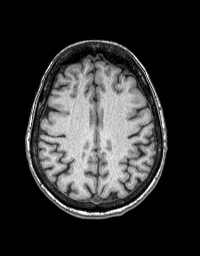
[im 128/160]
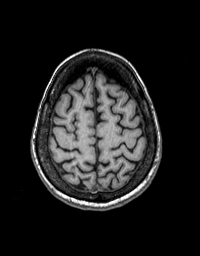
[im 144/160]
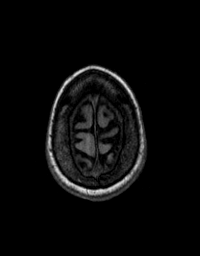
[im 160/160]
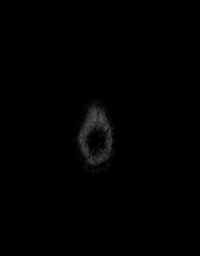

[Series 11: T2 post-contrast · coronal · 5.0mm · 0.43mm/px · 2 of 31 slices shown]
[im 1/31]
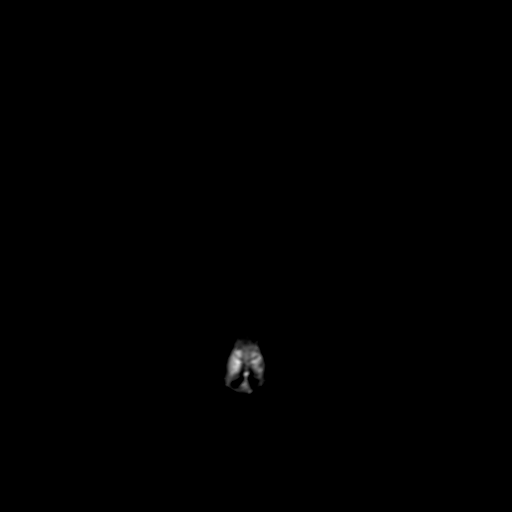
[im 31/31]
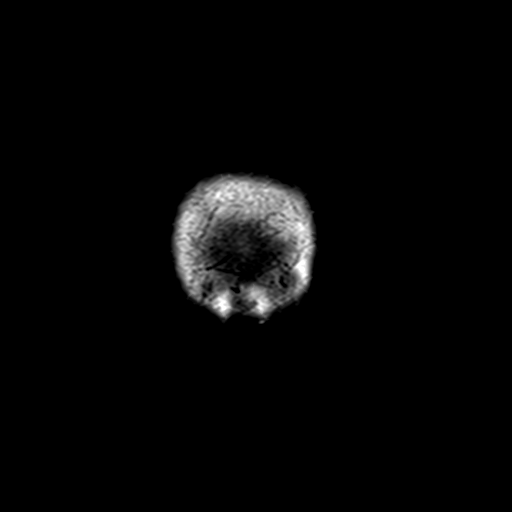

[Series 12: T1 post-contrast · axial · 1.0mm · 1.00mm/px · z∈[-55,+101]mm · 11 of 159 slices shown (1 of 2)]
[im 1/159]
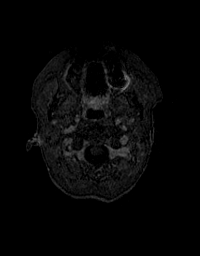
[im 16/159]
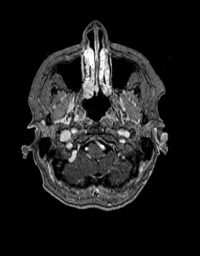
[im 32/159]
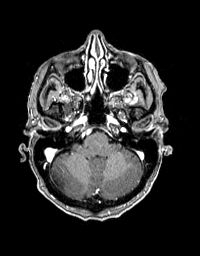
[im 48/159]
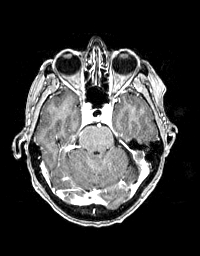
[im 64/159]
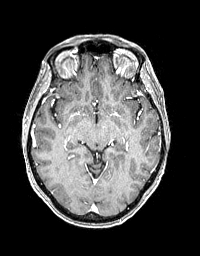
[im 80/159]
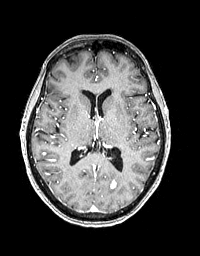
[im 95/159]
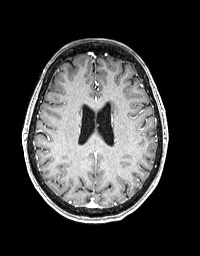
[im 111/159]
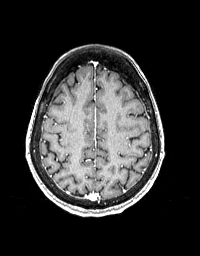
[im 127/159]
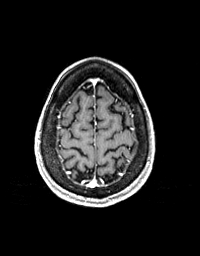
[im 143/159]
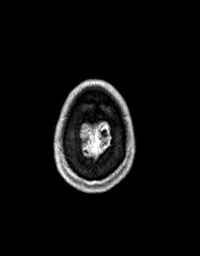
[im 159/159]
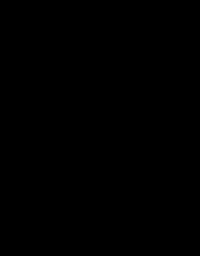

[Series 13: T1 post-contrast · coronal · 5.0mm · 0.43mm/px · 2 of 31 slices shown (2 of 2)]
[im 1/31]
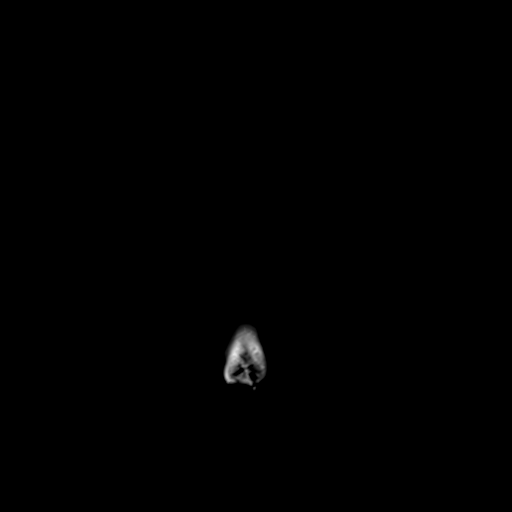
[im 31/31]
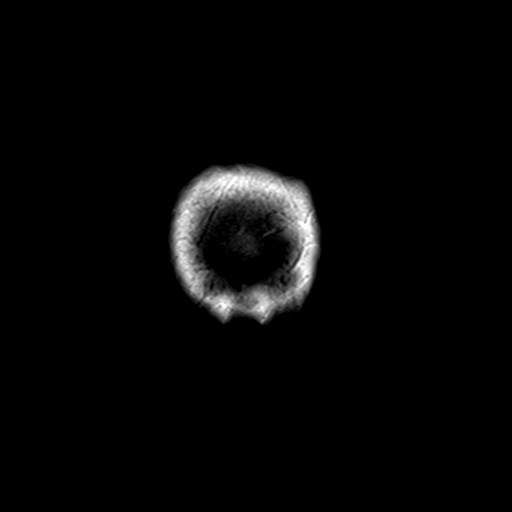

[Series 100: DWI · axial · 3.0mm · 1.20mm/px · z∈[-46,+109]mm · 4 of 53 slices shown (3 of 4)]
[im 1/53]
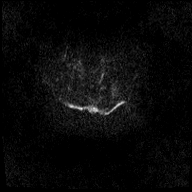
[im 18/53]
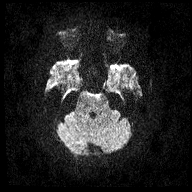
[im 35/53]
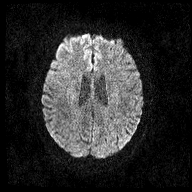
[im 53/53]
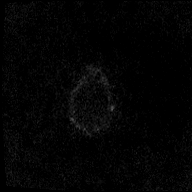

[Series 101: DWI · coronal · 3.0mm · 1.15mm/px · 3 of 49 slices shown (4 of 4)]
[im 1/49]
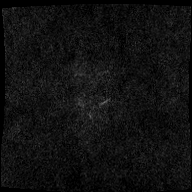
[im 25/49]
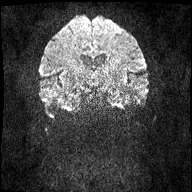
[im 49/49]
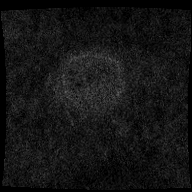

[48 of 48 positions shown; findings below may reference images not displayed]

FINDINGS: Brain: White matter FLAIR hyperintense lesion superior to the
occipital horn of the left lateral ventricle. This is DWI
hyperintense primarily from shine through, although there is
potentially a thin rim of restriction. The lesion has a incomplete
rim like enhancement pattern. The enhancing area measures 16 x 7 mm
on axial slices. There is a subcentimeter FLAIR hyperintensity
within the superficial right pons that is also enhancing, see
coronal postcontrast imaging. Few scattered FLAIR hyperintensities
in the cerebral white matter. There are a few scattered FLAIR
hyperintensities in the bilateral periventricular and deep cerebral
white matter separate from the enhancing lesion. Multiple sclerosis
is favored given the location and appearance of these enhancing
areas. Subacute ischemia with this distribution would be unusual.
Lesion characteristics is also not typical for metastatic disease.

Vascular: Major flow voids and vascular enhancements are preserved.

Skull and upper cervical spine: No evidence of marrow lesion.
Cervical facet spurring.

Sinuses/Orbits: Negative

Other: These results will be called to the ordering clinician or
representative by the Radiologist Assistant, and communication
documented in the PACS or zVision Dashboard.
IMPRESSION: Enhancing areas in the superficial right pons and left
periventricular white matter about the occipital horn of the lateral
ventricle. Active demyelinating disease such as multiple sclerosis
is favored over metastatic disease or subacute ischemia. In the
setting of recent diagnosis breast cancer, 3 month imaging follow-up
is recommended.

## 2020-08-06 DIAGNOSIS — H518 Other specified disorders of binocular movement: Secondary | ICD-10-CM | POA: Diagnosis not present

## 2020-08-06 DIAGNOSIS — H532 Diplopia: Secondary | ICD-10-CM | POA: Insufficient documentation

## 2020-08-06 DIAGNOSIS — H04123 Dry eye syndrome of bilateral lacrimal glands: Secondary | ICD-10-CM | POA: Diagnosis not present

## 2020-08-07 ENCOUNTER — Telehealth (INDEPENDENT_AMBULATORY_CARE_PROVIDER_SITE_OTHER): Payer: Self-pay | Admitting: Gastroenterology

## 2020-08-07 ENCOUNTER — Other Ambulatory Visit: Payer: Self-pay

## 2020-08-07 DIAGNOSIS — Z86010 Personal history of colonic polyps: Secondary | ICD-10-CM

## 2020-08-07 MED ORDER — NA SULFATE-K SULFATE-MG SULF 17.5-3.13-1.6 GM/177ML PO SOLN
1.0000 | Freq: Once | ORAL | 0 refills | Status: AC
Start: 2020-08-07 — End: 2020-08-07

## 2020-08-07 NOTE — Progress Notes (Signed)
Gastroenterology Pre-Procedure Review  Request Date: Thursday 09/03/20 Requesting Physician: Dr. Allen Norris  PATIENT REVIEW QUESTIONS: The patient responded to the following health history questions as indicated:    1. Are you having any GI issues? no 2. Do you have a personal history of Polyps? yes (yes last colonoscopy performed by Dr. Allen Norris 2019) 3. Do you have a family history of Colon Cancer or Polyps? yes (mother colon cancer) 4. Diabetes Mellitus? no 5. Joint replacements in the past 12 months?no 6. Major health problems in the past 3 months?no 7. Any artificial heart valves, MVP, or defibrillator?no    MEDICATIONS & ALLERGIES:    Patient reports the following regarding taking any anticoagulation/antiplatelet therapy:   Plavix, Coumadin, Eliquis, Xarelto, Lovenox, Pradaxa, Brilinta, or Effient? no Aspirin? no  Patient confirms/reports the following medications:  Current Outpatient Medications  Medication Sig Dispense Refill  . ALPRAZolam (XANAX) 0.25 MG tablet TAKE (1) TABLET BY MOUTH EVERY DAY AS NEEDED 30 tablet 5  . Calcium Citrate-Vitamin D (CALCIUM + D PO) Take 1 tablet by mouth 2 (two) times daily.    . cholecalciferol (VITAMIN D3) 25 MCG (1000 UNIT) tablet Take 1,000 Units by mouth daily.    . Coenzyme Q10-Fish Oil-Vit E (CO-Q 10 OMEGA-3 FISH OIL PO) Take by mouth daily.    . DULoxetine (CYMBALTA) 60 MG capsule Take 1 capsule by mouth 2 (two) times daily. [facility] doctor    . gabapentin (NEURONTIN) 300 MG capsule Take 1 capsule (300 mg total) by mouth at bedtime. (Patient taking differently: Take 300 mg by mouth in the morning and at bedtime. [facility] doctor- morning and night) 90 capsule 1  . Magnesium 400 MG CAPS Take 1 capsule by mouth 2 (two) times daily.     . Multiple Vitamins-Minerals (OCUVITE ADULT 50+ PO) Take 1 each by mouth 2 (two) times daily.    Marland Kitchen oxybutynin (DITROPAN-XL) 5 MG 24 hr tablet TAKE (1) TABLET BY MOUTH DAILY AT BEDTIME 90 tablet 1  . simvastatin (ZOCOR) 20 MG  tablet TAKE (1) TABLET BY MOUTH EVERY DAY 90 tablet 1  . vitamin E 400 UNIT capsule Take 400 Units by mouth daily.     No current facility-administered medications for this visit.    Patient confirms/reports the following allergies:  Allergies  Allergen Reactions  . Aspirin   . Macrobid [Nitrofurantoin]   . 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 Sulfa Antibiotics Other (See Comments)    Platelets drop     No orders of the defined types were placed in this encounter.   AUTHORIZATION INFORMATION Primary Insurance: 1D#: Group #:  Secondary Insurance: 1D#: Group #:  SCHEDULE INFORMATION: Date: Thursday 09/03/20 Time: Location:[facility]

## 2020-08-11 ENCOUNTER — Telehealth: Payer: Self-pay

## 2020-08-11 NOTE — Telephone Encounter (Unsigned)
Copied from [place] [id number]. Topic: General - Other >> Aug 11, 2020  2:02 PM Celene Kras wrote: Reason for CRM: Pt called and is requesting to have Prior Approval for a colonoscopy that she is supposed to receive on 09/03/20. Please advise.

## 2020-08-11 NOTE — Telephone Encounter (Signed)
Please let her know that [facility] does the PA, not [place]

## 2020-08-12 NOTE — Telephone Encounter (Signed)
Called spoke to pt

## 2020-08-25 ENCOUNTER — Encounter: Payer: Self-pay | Admitting: Gastroenterology

## 2020-09-01 ENCOUNTER — Other Ambulatory Visit: Payer: Self-pay

## 2020-09-01 ENCOUNTER — Other Ambulatory Visit
Admission: RE | Admit: 2020-09-01 | Discharge: 2020-09-01 | Disposition: A | Discharge status: Home / Self Care | Payer: Medicare Other | Source: Ambulatory Visit | Attending: Gastroenterology | Admitting: Gastroenterology

## 2020-09-01 DIAGNOSIS — Z20822 Contact with and (suspected) exposure to covid-19: Secondary | ICD-10-CM | POA: Insufficient documentation

## 2020-09-01 DIAGNOSIS — Z01812 Encounter for preprocedural laboratory examination: Secondary | ICD-10-CM | POA: Insufficient documentation

## 2020-09-01 LAB — SARS CORONAVIRUS 2 (TAT 6-24 HRS): SARS Coronavirus 2: NEGATIVE

## 2020-09-02 NOTE — Discharge Instructions (Signed)

## 2020-09-03 ENCOUNTER — Encounter: Payer: Self-pay | Admitting: Gastroenterology

## 2020-09-03 ENCOUNTER — Ambulatory Visit: Payer: Medicare Other | Admitting: Anesthesiology

## 2020-09-03 ENCOUNTER — Other Ambulatory Visit: Payer: Self-pay

## 2020-09-03 ENCOUNTER — Ambulatory Visit
Admission: RE | Admit: 2020-09-03 | Discharge: 2020-09-03 | Disposition: A | Discharge status: Home / Self Care | Payer: Medicare Other | Attending: Gastroenterology | Admitting: Gastroenterology

## 2020-09-03 ENCOUNTER — Encounter
Admission: RE | Disposition: A | Discharge status: Home / Self Care | Payer: Self-pay | Source: Home / Self Care | Attending: Gastroenterology

## 2020-09-03 DIAGNOSIS — Z7982 Long term (current) use of aspirin: Secondary | ICD-10-CM | POA: Insufficient documentation

## 2020-09-03 DIAGNOSIS — Z881 Allergy status to other antibiotic agents status: Secondary | ICD-10-CM | POA: Diagnosis not present

## 2020-09-03 DIAGNOSIS — Z8249 Family history of ischemic heart disease and other diseases of the circulatory system: Secondary | ICD-10-CM | POA: Diagnosis not present

## 2020-09-03 DIAGNOSIS — K5730 Diverticulosis of large intestine without perforation or abscess without bleeding: Secondary | ICD-10-CM | POA: Insufficient documentation

## 2020-09-03 DIAGNOSIS — Z791 Long term (current) use of non-steroidal anti-inflammatories (NSAID): Secondary | ICD-10-CM | POA: Diagnosis not present

## 2020-09-03 DIAGNOSIS — Z800 Family history of malignant neoplasm of digestive organs: Secondary | ICD-10-CM | POA: Diagnosis not present

## 2020-09-03 DIAGNOSIS — Z79899 Other long term (current) drug therapy: Secondary | ICD-10-CM | POA: Diagnosis not present

## 2020-09-03 DIAGNOSIS — Z882 Allergy status to sulfonamides status: Secondary | ICD-10-CM | POA: Diagnosis not present

## 2020-09-03 DIAGNOSIS — Z86010 Personal history of colonic polyps: Secondary | ICD-10-CM | POA: Insufficient documentation

## 2020-09-03 DIAGNOSIS — K640 First degree hemorrhoids: Secondary | ICD-10-CM | POA: Insufficient documentation

## 2020-09-03 DIAGNOSIS — Z853 Personal history of malignant neoplasm of breast: Secondary | ICD-10-CM | POA: Diagnosis not present

## 2020-09-03 DIAGNOSIS — Z886 Allergy status to analgesic agent status: Secondary | ICD-10-CM | POA: Diagnosis not present

## 2020-09-03 DIAGNOSIS — Z1211 Encounter for screening for malignant neoplasm of colon: Secondary | ICD-10-CM | POA: Insufficient documentation

## 2020-09-03 HISTORY — PX: COLONOSCOPY WITH PROPOFOL: SHX5780

## 2020-09-03 SURGERY — COLONOSCOPY WITH PROPOFOL
Anesthesia: General | Site: Rectum

## 2020-09-03 MED ORDER — LACTATED RINGERS IV SOLN: INTRAVENOUS | Status: DC

## 2020-09-03 MED ORDER — SODIUM CHLORIDE 0.9 % IV SOLN
INTRAVENOUS | Status: DC
Start: 2020-09-03 — End: 2020-09-03

## 2020-09-03 MED ORDER — PROPOFOL 10 MG/ML IV BOLUS
INTRAVENOUS | Status: DC | PRN
  Administered 2020-09-03: 30 mg via INTRAVENOUS
  Administered 2020-09-03: 20 mg via INTRAVENOUS
  Administered 2020-09-03: 50 mg via INTRAVENOUS
  Administered 2020-09-03: 20 mg via INTRAVENOUS

## 2020-09-03 MED ORDER — STERILE WATER FOR IRRIGATION IR SOLN: Status: DC | PRN

## 2020-09-03 MED ORDER — LIDOCAINE HCL (CARDIAC) PF 100 MG/5ML IV SOSY
PREFILLED_SYRINGE | INTRAVENOUS | Status: DC | PRN
  Administered 2020-09-03: 50 mg via INTRAVENOUS

## 2020-09-03 SURGICAL SUPPLY — 6 items
GOWN CVR UNV OPN BCK APRN NK (MISCELLANEOUS) ×2 IMPLANT
GOWN ISOL THUMB LOOP REG UNIV (MISCELLANEOUS) ×4
KIT PRC NS LF DISP ENDO (KITS) ×1 IMPLANT
KIT PROCEDURE OLYMPUS (KITS) ×2
MANIFOLD NEPTUNE II (INSTRUMENTS) ×2 IMPLANT
WATER STERILE IRR 250ML POUR (IV SOLUTION) ×2 IMPLANT

## 2020-09-03 NOTE — Anesthesia Procedure Notes (Signed)
Procedure Name: MAC Date/Time: 09/03/2020 7:50 AM Performed by: Silvana Newness, CRNA Pre-anesthesia Checklist: Patient identified, Emergency Drugs available, Suction available, Patient being monitored and Timeout performed Patient Re-evaluated:Patient Re-evaluated prior to induction Oxygen Delivery Method: Nasal cannula Placement Confirmation: positive ETCO2

## 2020-09-03 NOTE — Transfer of Care (Signed)
Immediate Anesthesia Transfer of Care Note  Patient: Melissa Daniels  Procedure(s) Performed: COLONOSCOPY WITH PROPOFOL (N/A Rectum)  Patient Location: PACU  Anesthesia Type: General  Level of Consciousness: awake, alert  and patient cooperative  Airway and Oxygen Therapy: Patient Spontanous Breathing and Patient connected to supplemental oxygen  Post-op Assessment: Post-op Vital signs reviewed, Patient's Cardiovascular Status Stable, Respiratory Function Stable, Patent Airway and No signs of Nausea or vomiting  Post-op Vital Signs: Reviewed and stable  Complications: No complications documented.

## 2020-09-03 NOTE — Anesthesia Preprocedure Evaluation (Signed)
Anesthesia Evaluation  Patient identified by MRN, date of birth, ID band Patient awake    Reviewed: Allergy & Precautions, NPO status , Patient's Chart, lab work & pertinent test results  Airway Mallampati: I  TM Distance: >3 FB Neck ROM: Full    Dental no notable dental hx.    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)    GI/Hepatic Neg liver ROS, History of colon polyps   Endo/Other  negative endocrine ROS  Renal/GU negative Renal ROS     Musculoskeletal  (+) Arthritis , Osteoarthritis,    Abdominal Normal abdominal exam  (+)   Peds  Hematology  (+) Blood dyscrasia (ITP s/p chemo (only sx is bruising, stable)), ,   Anesthesia Other Findings   Reproductive/Obstetrics negative OB ROS                            Anesthesia Physical  Anesthesia Plan  ASA: II  Anesthesia Plan: General   Post-op Pain Management:    Induction: Intravenous  PONV Risk Score and Plan: 3 and Propofol infusion, TIVA and Treatment may vary due to age or medical condition  Airway Management Planned: Natural Airway and Nasal Cannula  Additional Equipment: None  Intra-op Plan:   Post-operative Plan:   Informed Consent: I have reviewed the patients History and Physical, chart, labs and discussed the procedure including the risks, benefits and alternatives for the proposed anesthesia with the patient or authorized representative who has indicated his/her understanding and acceptance.     Dental advisory given  Plan Discussed with: CRNA  Anesthesia Plan Comments:         Anesthesia Quick Evaluation

## 2020-09-03 NOTE — Op Note (Signed)
[facility] - Inpatient Gastroenterology Patient Name: Melissa Daniels Procedure Date: 09/03/2020 7:14 AM MRN: [id number] Account #: [id number] Date of Birth: 1945-11-28 Admit Type: Outpatient Age: 75 Room: [facility] OR ROOM 01 Gender: Female Note Status: Finalized Procedure:             Colonoscopy Indications:           High risk colon cancer surveillance: Personal history                         of colonic polyps Providers:             Lucilla Lame MD, MD Referring MD:          Juline Patch, MD (Referring MD) Medicines:             Propofol per Anesthesia Complications:         No immediate complications. Procedure:             Pre-Anesthesia Assessment:                        - Prior to the procedure, a History and Physical was                         performed, and patient medications and allergies were                         reviewed. The patient's tolerance of previous                         anesthesia was also reviewed. The risks and benefits                         of the procedure and the sedation options and risks                         were discussed with the patient. All questions were                         answered, and informed consent was obtained. Prior                         Anticoagulants: The patient has taken no previous                         anticoagulant or antiplatelet agents. ASA Grade                         Assessment: II - A patient with mild systemic disease.                         After reviewing the risks and benefits, the patient                         was deemed in satisfactory condition to undergo the                         procedure.  After obtaining informed consent, the colonoscope was                         passed under direct vision. Throughout the procedure,                         the patient's blood pressure, pulse, and oxygen                         saturations were monitored continuously. The was                          introduced through the anus and advanced to the the                         cecum, identified by appendiceal orifice and ileocecal                         valve. The colonoscopy was performed without                         difficulty. The patient tolerated the procedure well.                         The quality of the bowel preparation was excellent. Findings:      The perianal and digital rectal examinations were normal.      Multiple small-mouthed diverticula were found in the sigmoid colon.      Non-bleeding internal hemorrhoids were found during retroflexion. The       hemorrhoids were Grade I (internal hemorrhoids that do not prolapse). Impression:            - Diverticulosis in the sigmoid colon.                        - Non-bleeding internal hemorrhoids.                        - No specimens collected. Recommendation:        - Discharge patient to home.                        - Resume previous diet.                        - Continue present medications.                        - Repeat colonoscopy is not recommended for                         surveillance.                        - unless any change in family history or lower GI                         problems. Procedure Code(s):     --- Professional ---                        [phone], Colonoscopy,  flexible; diagnostic, including                         collection of specimen(s) by brushing or washing, when                         performed (separate procedure) Diagnosis Code(s):     --- Professional ---                        Z86.010, Personal history of colonic polyps CPT copyright 2019 American Medical Association. All rights reserved. The codes documented in this report are preliminary and upon coder review may  be revised to meet current compliance requirements. Lucilla Lame MD, MD 09/03/2020 8:07:01 AM This report has been signed electronically. Number of Addenda: 0 Note Initiated On: 09/03/2020 7:14 AM Scope  Withdrawal Time: 0 hours 6 minutes 24 seconds  Total Procedure Duration: 0 hours 14 minutes 30 seconds  Estimated Blood Loss:  Estimated blood loss: none.      [facility]

## 2020-09-03 NOTE — H&P (Signed)
Lucilla Lame, MD [address] [place], [place] [address] Phone:[phone] Fax : [phone]  Primary Care Physician:  Juline Patch, MD Primary Gastroenterologist:  Dr. Allen Norris  Pre-Procedure History & Physical: HPI:  Melissa Daniels is a 75 y.o. female is here for an colonoscopy.   Past Medical History:  Diagnosis Date  . Anxiety   . Arthritis    hands  . Double vision   . Hypercholesteremia   . ITP (idiopathic thrombocytopenic purpura)   . Malignant neoplasm of upper-outer quadrant of left breast in female, estrogen receptor positive ([place]) 01/10/2017  . Neuropathy    bilateral feet  . Osteoporosis   . Vertigo    several episodes per year    Past Surgical History:  Procedure Laterality Date  . BREAST BIOPSY Left 12/28/2016   stereo path pend  . BREAST LUMPECTOMY    . COLONOSCOPY WITH PROPOFOL N/A 09/08/2017   Procedure: COLONOSCOPY WITH PROPOFOL;  Surgeon: Lucilla Lame, MD;  Location: [place];  Service: Endoscopy;  Laterality: N/A;  . POLYPECTOMY  09/08/2017   Procedure: POLYPECTOMY;  Surgeon: Lucilla Lame, MD;  Location: [place];  Service: Endoscopy;;  . SPLENECTOMY, TOTAL      Prior to Admission medications   Medication Sig Start Date End Date Taking? Authorizing Provider  ALPRAZolam (XANAX) 0.25 MG tablet TAKE (1) TABLET BY MOUTH EVERY DAY AS NEEDED 06/03/20  Yes Juline Patch, MD  Calcium Citrate-Vitamin D (CALCIUM + D PO) Take 1 tablet by mouth 2 (two) times daily.   Yes [provider]  cholecalciferol (VITAMIN D3) 25 MCG (1000 UNIT) tablet Take 1,000 Units by mouth daily.   Yes [provider]  Coenzyme Q10-Fish Oil-Vit E (CO-Q 10 OMEGA-3 FISH OIL PO) Take by mouth daily.   Yes [provider]  DULoxetine (CYMBALTA) 60 MG capsule Take 1 capsule by mouth 2 (two) times daily. [facility] doctor 04/20/19  Yes [provider]  gabapentin (NEURONTIN) 300 MG capsule Take 1 capsule (300 mg total) by mouth  at bedtime. Patient taking differently: Take 300 mg by mouth in the morning and at bedtime. [facility] doctor- morning and night 06/28/19  Yes Juline Patch, MD  Magnesium 400 MG CAPS Take 1 capsule by mouth 2 (two) times daily.    Yes [provider]  Multiple Vitamins-Minerals (OCUVITE ADULT 50+ PO) Take by mouth.   Yes [provider]  oxybutynin (DITROPAN-XL) 5 MG 24 hr tablet TAKE (1) TABLET BY MOUTH DAILY AT BEDTIME 12/19/19  Yes Juline Patch, MD  simvastatin (ZOCOR) 20 MG tablet TAKE (1) TABLET BY MOUTH EVERY DAY 06/03/20  Yes Juline Patch, MD  vitamin E 400 UNIT capsule Take 400 Units by mouth daily.   Yes [provider]    Allergies as of 08/07/2020 - Review Complete 08/07/2020  Allergen Reaction Noted  . Aspirin  11/28/2017  . Macrobid [nitrofurantoin]  06/28/2015  . Nsaids Other (See Comments) 11/20/2017  . Sulfa antibiotics Other (See Comments) 06/28/2015    Family History  Problem Relation Age of Onset  . Heart disease Mother   . Colon cancer Mother   . Hypertension Mother   . Heart disease Father   . Hypertension Father   . Breast cancer Neg Hx     Social History   Socioeconomic History  . Marital status: Widowed    Spouse name: Not on file  . Number of children: 3  . Years of education: Not on file  .  Highest education level: 12th grade  Occupational History  . Occupation: Retired  Tobacco Use  . Smoking status: Never Smoker  . Smokeless tobacco: Never Used  . Tobacco comment: smoking cessation materials not required  Vaping Use  . Vaping Use: Never used  Substance and Sexual Activity  . Alcohol use: No    Alcohol/week: 0.0 standard drinks  . Drug use: No  . Sexual activity: Not Currently  Other Topics Concern  . Not on file  Social History Narrative  . Not on file   Social Determinants of Health   Financial Resource Strain: Not on file  Food Insecurity: Not on file  Transportation Needs: Not on file  Physical  Activity: Not on file  Stress: Not on file  Social Connections: Not on file  Intimate Partner Violence: Not on file    Review of Systems: See HPI, otherwise negative ROS  Physical Exam: BP 123/66   Pulse 73   Temp (!) 97 F (36.1 C) (Temporal)   Resp 16   Ht 5\' 6"  (1.676 m)   Wt 64.4 kg   SpO2 98%   BMI 22.92 kg/m  General:   Alert,  pleasant and cooperative in NAD Head:  Normocephalic and atraumatic. Neck:  Supple; no masses or thyromegaly. Lungs:  Clear throughout to auscultation.    Heart:  Regular rate and rhythm. Abdomen:  Soft, nontender and nondistended. Normal bowel sounds, without guarding, and without rebound.   Neurologic:  Alert and  oriented x4;  grossly normal neurologically.  Impression/Plan: Melissa Daniels is here for an colonoscopy to be performed for a history of adenomatous polyps on 08/2017   Risks, benefits, limitations, and alternatives regarding  colonoscopy have been reviewed with the patient.  Questions have been answered.  All parties agreeable.   Lucilla Lame, MD  09/03/2020, 7:39 AM

## 2020-09-03 NOTE — Anesthesia Postprocedure Evaluation (Signed)
Anesthesia Post Note  Patient: Melissa Daniels  Procedure(s) Performed: COLONOSCOPY WITH PROPOFOL (N/A Rectum)     Patient location during evaluation: PACU Anesthesia Type: General Level of consciousness: awake and alert Pain management: pain level controlled Vital Signs Assessment: post-procedure vital signs reviewed and stable Respiratory status: nonlabored ventilation and spontaneous breathing Cardiovascular status: blood pressure returned to baseline Postop Assessment: no apparent nausea or vomiting Anesthetic complications: no   No complications documented.  Ashleigh Arya [employer]

## 2020-09-03 NOTE — Patient Instructions (Incomplete)
Patient reports that her imbalance/dizziness problems began last year. Patient denies vertigo. Patient reports that she also began to have issued with double vision which began in July 2019. 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 Patient report she has been wearing a prism applique on the left lens of her eyeglasses which has corrected her double vision. Patient reports she is going to get new lens with the prism embedded in the lens. Patient being followed by the [facility] and [facility]. Patient reports she thought at first that her dizziness/imbalance symptoms were related to the Abducens nerve palsy. Patient describes her dizziness as imbalance and denies vertigo. Patient reports she gets imbalance symptoms daily multiple times each day. Patient reports she feels off-balance, staggers and veers when she walks. Patient reports her "eyes feel funny" at times but denies oscillopsia. Patient reports that head turns, quick movements, bending over, looking up and quick turns all bring on her symptoms and are aggravating factors and reports that holding onto something helps ease her symptoms. Patient denies falls. Patient states she is able to do her chores but states she moves slower on the steps because she is afraid of falling. Patient reports her laundry is in the basement and she has 7 steps to enter her home. Patient reports she went to Dr. Kathyrn Sheriff at 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 She denied any other cranial nerve symptoms at that time.  She was  most recently see in our clinic on 06/14/19. Melissa Daniels says that when she got a prism in her glasses in 2020 her double vision improved for a period of time. However, it recurred in December 2020 and has been an issue ever since. The double vision is intermittent with no obvious precipitating factors. She tends to notice the double vision more in the afternoons. Occasionally it will improve with rest. She thinks it is worse with left lateral gaze and it resolves when covering one eye. There is no associated eye pain. Sometimes she has associated blurry vision in one eye are both which is worse with far vision. These episodes are occurring at least once per day.     Melissa Daniels is a 75 year old female who presents to neuroimmunology clinic for follow up of diplopia. Diplopia has been present for two years though it improved for a period of time with prisms. At onset of symptoms, she had abnormal brain MRI with a left occipital lobe periventricular lesion that had associated contrast enhancement. 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 On repeat imaging, this lesion self-resolved though she continues to have diplopia. We wonder now if a nerve injury could have causes resulting atrophy of an ocular muscle leading to persistent symptoms. Also on the differential includes sagging eye syndrome per evaluation by Dr. Danise Mina. We have not found evidence of a relapsing demyelinating disorder such as MS, paraneoplastic syndrome or myasthenia gravis after extensive workup. We discussed the differential diagnosis today and  our current impressions. Unfortunately, we do not have any medical therapy to offer at this time. We discussed that her imbalance is likely secondary to her peripheral neuropathy with impaired vibratory sensation in distal lower extremities.   SUBJECTIVE Chief complaint: Onset: Recent changes in overall health/medication: No Directional  pattern for falls: None Prior history of physical therapy for balance: None Follow-up appointment with MD: None scheduled Red flags (bowel/bladder changes, saddle paresthesia, personal history of cancer, chills/fever, night sweats, unrelenting pain) Negative   OBJECTIVE  MUSCULOSKELETAL: Tremor: Absent Bulk: Normal Tone: Normal, no clonus  Posture No gross abnormalities noted in standing or seated posture  Gait No gross abnormalities in gait noted  Strength R/L 5/5 Hip flexion 5/5 Hip external rotation 5/5 Hip internal rotation 5/5 Hip extension  5/5 Hip abduction 5/5 Hip adduction 5/5 Knee extension 5/5 Knee flexion 5/5 Ankle Plantarflexion 5/5 Ankle Dorsiflexion   NEUROLOGICAL:  Mental Status Patient is oriented to person, place and time.  Recent memory is intact.  Remote memory is intact.  Attention span and concentration are intact.  Expressive speech is intact.  Patient's fund of knowledge is within normal limits for educational level.  Cranial Nerves Visual acuity and visual fields are intact  Extraocular muscles are intact  Facial sensation is intact bilaterally  Facial strength is intact bilaterally  Hearing is normal as tested by gross conversation Palate elevates midline, normal phonation  Shoulder shrug strength is intact  Tongue protrudes midline   Sensation Grossly intact to light touch bilateral UEs/LEs as determined by testing dermatomes C2-T2/L2-S2 respectively Proprioception and hot/cold testing deferred on this date  Reflexes R/L 2+/2+ Knee Jerk (L3/4) 2+/2+ Ankle Jerk (S1/2)  Coordination/Cerebellar Finger to Nose: WNL Heel to Shin: WNL Rapid alternating movements: WNL Finger Opposition: WNL Pronator Drift: Negative   FUNCTIONAL OUTCOME MEASURES   Results Comments  BERG /56 Fall risk, in need of intervention  DGI /24   FGA /30   TUG seconds   5TSTS seconds   6 Minute Walk Test    10 Meter Gait Speed Self-selected: s =  m/s; Fastest: s = m/s Below normative values for full community ambulation  ABC Scale %   DHI /100     POSTURAL CONTROL TESTS   Modified Clinical Test of Sensory Interaction for Balance    (CTSIB):  CONDITION TIME STRATEGY SWAY  Eyes open, firm surface 30 seconds ankle   Eyes closed, firm surface 30 seconds ankle   Eyes open, foam surface 30 seconds ankle   Eyes closed, foam surface 30 seconds ankle     ASSESSMENT Clinical Impression: Pt is a pleasant year-old female/female referred for difficulty with balance. PT examination reveals deficits . Pt presents with deficits in strength, gait and balance. Pt will benefit from skilled PT services to address deficits in balance and decrease risk for future falls.    PLAN Next Visit: HEP:    Pt will be independent with HEP in order to improve strength and balance in order to decrease fall risk and improve function at home and work.   Pt will improve BERG by at least 3 points in order to demonstrate clinically significant improvement in balance.    Pt will improve DGI by at least 3 points in order to demonstrate clinically significant improvement in balance and decreased risk for falls.  Pt will improve ABC by at least 13% in order to demonstrate clinically significant improvement in balance confidence.   Pt will decrease 5TSTS by at least 3 seconds  in order to demonstrate clinically significant improvement in LE strength.  Pt will decrease TUG to below 14 seconds/decrease in order to demonstrate decreased fall risk.  Pt will decrease DHI score by at least 18 points in order to demonstrate clinically significant reduction in disability   Pt will increase 6MWT by at least 75m (135ft) in order to demonstrate clinically significant improvement in cardiopulmonary endurance and community ambulation

## 2020-09-04 ENCOUNTER — Encounter: Payer: Self-pay | Admitting: Gastroenterology

## 2020-09-07 ENCOUNTER — Ambulatory Visit (INDEPENDENT_AMBULATORY_CARE_PROVIDER_SITE_OTHER): Payer: Medicare Other

## 2020-09-07 DIAGNOSIS — Z0000 Encounter for general adult medical examination without abnormal findings: Secondary | ICD-10-CM

## 2020-09-07 NOTE — Progress Notes (Signed)
Subjective:   Melissa Daniels is a 75 y.o. female who presents for Medicare Annual (Subsequent) preventive examination.  Virtual Visit via Telephone Note  I connected with  [name] on 09/07/20 at  1:20 PM EDT by telephone and verified that I am speaking with the correct person using two identifiers.  Location: Patient: home Provider: [facility] Persons participating in the virtual visit: [place]   I discussed the limitations, risks, security and privacy concerns of performing an evaluation and management service by telephone and the availability of in person appointments. The patient expressed understanding and agreed to proceed.  Interactive audio and video telecommunications were attempted between this nurse and patient, however failed, due to patient having technical difficulties OR patient did not have access to video capability.  We continued and completed visit with audio only.  Some vital signs may be absent or patient reported.   Clemetine Marker, LPN    Review of Systems     Cardiac Risk Factors include: advanced age (>80men, >30 women);dyslipidemia     Objective:    There were no vitals filed for this visit. There is no height or weight on file to calculate BMI.  Advanced Directives 09/07/2020 09/03/2020 08/28/2019 12/14/2018 08/22/2018 10/21/2017 10/21/2017  Does Patient Have a Medical Advance Directive? Yes Yes Yes Yes Yes - No  Type of Paramedic of [place];Living will [place];Living will [place];Living will [place];Living will Living will;Healthcare Power of [place];Living will  Does patient want to make changes to medical advance directive? - No - Patient declined - - - - -  Copy of [place] in Chart? No - copy requested Yes - validated most recent copy scanned in chart (See row information) No - copy requested - No - copy  requested - -  Would patient like information on creating a medical advance directive? - - - - - No - Patient declined -    Current Medications (verified) Outpatient Encounter Medications as of 09/07/2020  Medication Sig  . ALPRAZolam (XANAX) 0.25 MG tablet TAKE (1) TABLET BY MOUTH EVERY DAY AS NEEDED  . Calcium Citrate-Vitamin D (CALCIUM + D PO) Take 1 tablet by mouth 2 (two) times daily.  . Coenzyme Q10-Fish Oil-Vit E (CO-Q 10 OMEGA-3 FISH OIL PO) Take by mouth daily.  . DULoxetine (CYMBALTA) 30 MG capsule Take 90 mg by mouth daily.  Marland Kitchen gabapentin (NEURONTIN) 300 MG capsule Take 1 capsule (300 mg total) by mouth at bedtime. (Patient taking differently: Take 300 mg by mouth in the morning and at bedtime. [facility] doctor- morning and night)  . Magnesium 400 MG CAPS Take 1 capsule by mouth 2 (two) times daily.   . Multiple Vitamins-Minerals (OCUVITE ADULT 50+ PO) Take by mouth.  . oxybutynin (DITROPAN-XL) 5 MG 24 hr tablet TAKE (1) TABLET BY MOUTH DAILY AT BEDTIME  . simvastatin (ZOCOR) 20 MG tablet TAKE (1) TABLET BY MOUTH EVERY DAY  . vitamin E 400 UNIT capsule Take 400 Units by mouth daily.  . [DISCONTINUED] cholecalciferol (VITAMIN D3) 25 MCG (1000 UNIT) tablet Take 1,000 Units by mouth daily.  . [DISCONTINUED] DULoxetine (CYMBALTA) 60 MG capsule Take 1 capsule by mouth 2 (two) times daily. [facility] doctor   No facility-administered encounter medications on file as of 09/07/2020.    Allergies (verified) Aspirin, Macrobid [nitrofurantoin], Nsaids, and Sulfa antibiotics   History: Past Medical History:  Diagnosis Date  . Anxiety   .  Arthritis    hands  . Double vision   . Hypercholesteremia   . ITP (idiopathic thrombocytopenic purpura)   . Malignant neoplasm of upper-outer quadrant of left breast in female, estrogen receptor positive ([place]) 01/10/2017  . Neuropathy    bilateral feet  . Osteoporosis   . Vertigo    several episodes per year   Past Surgical History:  Procedure Laterality  Date  . BREAST BIOPSY Left 12/28/2016   stereo path pend  . BREAST LUMPECTOMY    . COLONOSCOPY WITH PROPOFOL N/A 09/08/2017   Procedure: COLONOSCOPY WITH PROPOFOL;  Surgeon: Lucilla Lame, MD;  Location: [place];  Service: Endoscopy;  Laterality: N/A;  . COLONOSCOPY WITH PROPOFOL N/A 09/03/2020   Procedure: COLONOSCOPY WITH PROPOFOL;  Surgeon: Lucilla Lame, MD;  Location: [place];  Service: Endoscopy;  Laterality: N/A;  priority 4  . POLYPECTOMY  09/08/2017   Procedure: POLYPECTOMY;  Surgeon: Lucilla Lame, MD;  Location: [place];  Service: Endoscopy;;  . SPLENECTOMY, TOTAL     Family History  Problem Relation Age of Onset  . Heart disease Mother   . Colon cancer Mother   . Hypertension Mother   . Heart disease Father   . Hypertension Father   . Breast cancer Neg Hx    Social History   Socioeconomic History  . Marital status: Widowed    Spouse name: Not on file  . Number of children: 3  . Years of education: Not on file  . Highest education level: 12th grade  Occupational History  . Occupation: Retired  Tobacco Use  . Smoking status: Never Smoker  . Smokeless tobacco: Never Used  . Tobacco comment: smoking cessation materials not required  Vaping Use  . Vaping Use: Never used  Substance and Sexual Activity  . Alcohol use: No    Alcohol/week: 0.0 standard drinks  . Drug use: No  . Sexual activity: Not Currently  Other Topics Concern  . Not on file  Social History Narrative   Pt lives alone   Social Determinants of Health   Financial Resource Strain: Low Risk   . Difficulty of Paying Living Expenses: Not hard at all  Food Insecurity: No Food Insecurity  . Worried About Charity fundraiser in the Last Year: Never true  . Ran Out of Food in the Last Year: Never true  Transportation Needs: No Transportation Needs  . Lack of Transportation (Medical): No  . Lack of Transportation (Non-Medical): No  Physical Activity: Insufficiently  Active  . Days of Exercise per Week: 5 days  . Minutes of Exercise per Session: 20 min  Stress: No Stress Concern Present  . Feeling of Stress : Only a little  Social Connections: Moderately Isolated  . Frequency of Communication with Friends and Family: More than three times a week  . Frequency of Social Gatherings with Friends and Family: Three times a week  . Attends Religious Services: More than 4 times per year  . Active Member of Clubs or Organizations: No  . Attends Archivist Meetings: Never  . Marital Status: Widowed    Tobacco Counseling Counseling given: Not Answered Comment: smoking cessation materials not required   Clinical Intake:  Pre-visit preparation completed: Yes  Pain : No/denies pain     Nutritional Risks: None Diabetes: No  How often do you need to have someone help you when you read instructions, pamphlets, or other written materials from your doctor or pharmacy?: 1 - Never  Interpreter Needed?: No  Information entered by :: Clemetine Marker LPN   Activities of Daily Living In your present state of health, do you have any difficulty performing the following activities: 09/07/2020 09/03/2020  Hearing? N N  Comment pt declines hearing aids -  Vision? N N  Difficulty concentrating or making decisions? N N  Walking or climbing stairs? N N  Dressing or bathing? N N  Doing errands, shopping? N -  Preparing Food and eating ? N -  Using the Toilet? N -  In the past six months, have you accidently leaked urine? Y -  Comment wears pads for protection -  Do you have problems with loss of bowel control? N -  Managing your Medications? N -  Managing your Finances? N -  Housekeeping or managing your Housekeeping? N -  Some recent data might be hidden    Patient Care Team: Juline Patch, MD as PCP - General (Family Medicine) Cornelia Copa, DO as Consulting Physician (Surgical Oncology) [place] Aundra Millet, MD as Consulting  Physician (Oncology) [place], [place] as Consulting Physician (Optometry)  Indicate any recent Medical Services you may have received from other than [facility] providers in the past year (date may be approximate).     Assessment:   This is a routine wellness examination for Blanche.  Hearing/Vision screen  Hearing Screening   125Hz  250Hz  500Hz  1000Hz  2000Hz  3000Hz  4000Hz  6000Hz  8000Hz   Right ear:           Left ear:           Comments: Pt denies hearing difficulty  Vision Screening Comments: [facility]; c/o double vision  Dietary issues and exercise activities discussed: Current Exercise Habits: Home exercise routine, Type of exercise: walking;treadmill, Time (Minutes): 20, Frequency (Times/Week): 5, Weekly Exercise (Minutes/Week): 100, Intensity: Mild, Exercise limited by: neurologic condition(s)  Goals    . DIET - INCREASE WATER INTAKE     Recommend to drink at least 6-8 8oz glasses of water per day.    . Weight (lb) < 150 lb (68 kg)     Pt states she would like to lose 5 lbs      Depression Screen PHQ 2/9 Scores 09/07/2020 06/03/2020 01/15/2020 12/19/2019 11/28/2019 08/28/2019 06/28/2019  PHQ - 2 Score 0 0 0 0 0 0 0  PHQ- 9 Score - 0 0 1 2 - 0    Fall Risk Fall Risk  09/07/2020 06/03/2020 12/19/2019 11/28/2019 08/28/2019  Falls in the past year? 0 0 0 1 0  Number falls in past yr: 0 - - 0 0  Injury with Fall? 0 - - 0 0  Risk for fall due to : Impaired vision - - - Impaired balance/gait  Risk for fall due to: Comment - - - - -  Follow up Falls prevention discussed Falls evaluation completed Falls evaluation completed Falls evaluation completed Falls prevention discussed    FALL RISK PREVENTION PERTAINING TO THE HOME:  Any stairs in or around the home? Yes  If so, are there any without handrails? No  Home free of loose throw rugs in walkways, pet beds, electrical cords, etc? Yes  Adequate lighting in your home to reduce risk of falls? Yes    ASSISTIVE DEVICES UTILIZED TO PREVENT FALLS:  Life alert? No  Use of a cane, walker or w/c? No  Grab bars in the bathroom? Yes  Shower chair or bench in shower? No  Elevated toilet seat  or a handicapped toilet? Yes   TIMED UP AND GO:  Was the test performed? No . Telephonic visit.      Cognitive Function: Normal cognitive status assessed by direct observation by this Nurse Health Advisor. No abnormalities found.       6CIT Screen 11/27/2018 08/22/2018 08/21/2017 10/31/2016  What Year? 0 points 0 points 0 points 0 points  What month? 0 points 0 points 0 points 0 points  What time? 0 points 0 points 0 points 0 points  Count back from 20 0 points 0 points 0 points 0 points  Months in reverse 0 points 0 points 0 points 0 points  Repeat phrase 0 points 0 points 4 points 0 points  Total Score 0 0 4 0    Immunizations Immunization History  Administered Date(s) Administered  . Influenza, High Dose Seasonal PF 03/08/2018  . Influenza,inj,quad, With Preservative 02/15/2019  . Influenza-Unspecified 02/27/2017, 01/29/2019, 02/26/2020  . Meningococcal Conjugate 08/17/2014  . PFIZER(Purple Top)SARS-COV-2 Vaccination 08/16/2019, 09/06/2019  . Pneumococcal Conjugate-13 05/30/2013  . Pneumococcal Polysaccharide-23 04/02/2012, 05/30/2014  . Tdap 10/19/2015    TDAP status: Up to date  Flu Vaccine status: Up to date  Pneumococcal vaccine status: Up to date  Covid-19 vaccine status: Completed vaccines  Qualifies for Shingles Vaccine? Yes   Zostavax completed No   Shingrix Completed?: No.    Education has been provided regarding the importance of this vaccine. Patient has been advised to call insurance company to determine out of pocket expense if they have not yet received this vaccine. Advised may also receive vaccine at local pharmacy or Health Dept. Verbalized acceptance and understanding.  Screening Tests Health Maintenance  Topic Date Due  . COVID-19 Vaccine (3 - Pfizer risk  4-dose series) 12/02/2020 (Originally 10/04/2019)  . INFLUENZA VACCINE  12/28/2020  . MAMMOGRAM  03/10/2021  . COLONOSCOPY (Pts 45-69yrs Insurance coverage will need to be confirmed)  09/04/2023  . TETANUS/TDAP  10/18/2025  . DEXA SCAN  Completed  . Hepatitis C Screening  Completed  . PNA vac Low Risk Adult  Completed  . HPV VACCINES  Aged Out  . Meningococcal B Vaccine  Discontinued    Health Maintenance  There are no preventive care reminders to display for this patient.  Colorectal cancer screening: Type of screening: Colonoscopy. Completed 09/03/20. Repeat every 3 years.   Mammogram status: Completed 03/10/20. Repeat every year  Bone Density status: Completed 09/03/20. Results reflect: Bone density results: OSTEOPENIA. Repeat every 2 years.  Lung Cancer Screening: (Low Dose CT Chest recommended if Age 82-80 years, 30 pack-year currently smoking OR have quit w/in 15years.) does not qualify.    Additional Screening:  Hepatitis C Screening: does qualify; Completed 10/31/16.  Vision Screening: Recommended annual ophthalmology exams for early detection of glaucoma and other disorders of the eye. Is the patient up to date with their annual eye exam?  Yes  Who is the provider or what is the name of the office in which the patient attends annual eye exams? [place]  Dental Screening: Recommended annual dental exams for proper oral hygiene  Community Resource Referral / Chronic Care Management: CRR required this visit?  No   CCM required this visit?  No      Plan:     I have personally reviewed and noted the following in the patient's chart:   . Medical and social history . Use of alcohol, tobacco or illicit drugs  . Current medications and supplements . Functional ability and status .  Nutritional status . Physical activity . Advanced directives . List of other physicians . Hospitalizations, surgeries, and ER visits in previous 12 months . Vitals . Screenings to  include cognitive, depression, and falls . Referrals and appointments  In addition, I have reviewed and discussed with patient certain preventive protocols, quality metrics, and best practice recommendations. A written personalized care plan for preventive services as well as general preventive health recommendations were provided to patient.     Clemetine Marker, LPN   2/37/0230   Nurse Notes: none

## 2020-09-07 NOTE — Patient Instructions (Signed)
Melissa Daniels , Thank you for taking time to come for your Medicare Wellness Visit. I appreciate your ongoing commitment to your health goals. Please review the following plan we discussed and let me know if I can assist you in the future.   Screening recommendations/referrals: Colonoscopy: done 09/03/20 Mammogram: done 03/10/20 Bone Density: done 07/04/19 Recommended yearly ophthalmology/optometry visit for glaucoma screening and checkup Recommended yearly dental visit for hygiene and checkup  Vaccinations: Influenza vaccine: done 02/26/20 Pneumococcal vaccine: done 2016 Tdap vaccine: done 10/19/15 Shingles vaccine: Shingrix discussed. Please contact your pharmacy for coverage information.  Covid-19: done 08/16/19 & 09/06/19  Advanced directives: Please bring a copy of your health care power of attorney and living will to the office at your convenience.  Conditions/risks identified: Keep up the great work!  Next appointment: Follow up in one year for your annual wellness visit    Preventive Care 65 Years and Older, Female Preventive care refers to lifestyle choices and visits with your health care provider that can promote health and wellness. What does preventive care include?  A yearly physical exam. This is also called an annual well check.  Dental exams once or twice a year.  Routine eye exams. Ask your health care provider how often you should have your eyes checked.  Personal lifestyle choices, including:  Daily care of your teeth and gums.  Regular physical activity.  Eating a healthy diet.  Avoiding tobacco and drug use.  Limiting alcohol use.  Practicing safe sex.  Taking low-dose aspirin every day.  Taking vitamin and mineral supplements as recommended by your health care provider. What happens during an annual well check? The services and screenings done by your health care provider during your annual well check will depend on your age, overall health, lifestyle risk  factors, and family history of disease. Counseling  Your health care provider may ask you questions about your:  Alcohol use.  Tobacco use.  Drug use.  Emotional well-being.  Home and relationship well-being.  Sexual activity.  Eating habits.  History of falls.  Memory and ability to understand (cognition).  Work and work Statistician.  Reproductive health. Screening  You may have the following tests or measurements:  Height, weight, and BMI.  Blood pressure.  Lipid and cholesterol levels. These may be checked every 5 years, or more frequently if you are over 34 years old.  Skin check.  Lung cancer screening. You may have this screening every year starting at age 38 if you have a 30-pack-year history of smoking and currently smoke or have quit within the past 15 years.  Fecal occult blood test (FOBT) of the stool. You may have this test every year starting at age 43.  Flexible sigmoidoscopy or colonoscopy. You may have a sigmoidoscopy every 5 years or a colonoscopy every 10 years starting at age 36.  Hepatitis C blood test.  Hepatitis B blood test.  Sexually transmitted disease (STD) testing.  Diabetes screening. This is done by checking your blood sugar (glucose) after you have not eaten for a while (fasting). You may have this done every 1-3 years.  Bone density scan. This is done to screen for osteoporosis. You may have this done starting at age 18.  Mammogram. This may be done every 1-2 years. Talk to your health care provider about how often you should have regular mammograms. Talk with your health care provider about your test results, treatment options, and if necessary, the need for more tests. Vaccines  Your health  care provider may recommend certain vaccines, such as:  Influenza vaccine. This is recommended every year.  Tetanus, diphtheria, and acellular pertussis (Tdap, Td) vaccine. You may need a Td booster every 10 years.  Zoster vaccine. You  may need this after age 76.  Pneumococcal 13-valent conjugate (PCV13) vaccine. One dose is recommended after age 10.  Pneumococcal polysaccharide (PPSV23) vaccine. One dose is recommended after age 51. Talk to your health care provider about which screenings and vaccines you need and how often you need them. This information is not intended to replace advice given to you by your health care provider. Make sure you discuss any questions you have with your health care provider. Document Released: 06/12/2015 Document Revised: 02/03/2016 Document Reviewed: 03/17/2015 Elsevier Interactive Patient Education  2017 [place] Prevention in the Home Falls can cause injuries. They can happen to people of all ages. There are many things you can do to make your home safe and to help prevent falls. What can I do on the outside of my home?  Regularly fix the edges of walkways and driveways and fix any cracks.  Remove anything that might make you trip as you walk through a door, such as a raised step or threshold.  Trim any bushes or trees on the path to your home.  Use bright outdoor lighting.  Clear any walking paths of anything that might make someone trip, such as rocks or tools.  Regularly check to see if handrails are loose or broken. Make sure that both sides of any steps have handrails.  Any raised decks and porches should have guardrails on the edges.  Have any leaves, snow, or ice cleared regularly.  Use sand or salt on walking paths during winter.  Clean up any spills in your garage right away. This includes oil or grease spills. What can I do in the bathroom?  Use night lights.  Install grab bars by the toilet and in the tub and shower. Do not use towel bars as grab bars.  Use non-skid mats or decals in the tub or shower.  If you need to sit down in the shower, use a plastic, non-slip stool.  Keep the floor dry. Clean up any water that spills on the floor as soon as  it happens.  Remove soap buildup in the tub or shower regularly.  Attach bath mats securely with double-sided non-slip rug tape.  Do not have throw rugs and other things on the floor that can make you trip. What can I do in the bedroom?  Use night lights.  Make sure that you have a light by your bed that is easy to reach.  Do not use any sheets or blankets that are too big for your bed. They should not hang down onto the floor.  Have a firm chair that has side arms. You can use this for support while you get dressed.  Do not have throw rugs and other things on the floor that can make you trip. What can I do in the kitchen?  Clean up any spills right away.  Avoid walking on wet floors.  Keep items that you use a lot in easy-to-reach places.  If you need to reach something above you, use a strong step stool that has a grab bar.  Keep electrical cords out of the way.  Do not use floor polish or wax that makes floors slippery. If you must use wax, use non-skid floor wax.  Do not have  throw rugs and other things on the floor that can make you trip. What can I do with my stairs?  Do not leave any items on the stairs.  Make sure that there are handrails on both sides of the stairs and use them. Fix handrails that are broken or loose. Make sure that handrails are as long as the stairways.  Check any carpeting to make sure that it is firmly attached to the stairs. Fix any carpet that is loose or worn.  Avoid having throw rugs at the top or bottom of the stairs. If you do have throw rugs, attach them to the floor with carpet tape.  Make sure that you have a light switch at the top of the stairs and the bottom of the stairs. If you do not have them, ask someone to add them for you. What else can I do to help prevent falls?  Wear shoes that:  Do not have high heels.  Have rubber bottoms.  Are comfortable and fit you well.  Are closed at the toe. Do not wear sandals.  If  you use a stepladder:  Make sure that it is fully opened. Do not climb a closed stepladder.  Make sure that both sides of the stepladder are locked into place.  Ask someone to hold it for you, if possible.  Clearly mark and make sure that you can see:  Any grab bars or handrails.  First and last steps.  Where the edge of each step is.  Use tools that help you move around (mobility aids) if they are needed. These include:  Canes.  Walkers.  Scooters.  Crutches.  Turn on the lights when you go into a dark area. Replace any light bulbs as soon as they burn out.  Set up your furniture so you have a clear path. Avoid moving your furniture around.  If any of your floors are uneven, fix them.  If there are any pets around you, be aware of where they are.  Review your medicines with your doctor. Some medicines can make you feel dizzy. This can increase your chance of falling. Ask your doctor what other things that you can do to help prevent falls. This information is not intended to replace advice given to you by your health care provider. Make sure you discuss any questions you have with your health care provider. Document Released: 03/12/2009 Document Revised: 10/22/2015 Document Reviewed: 06/20/2014 Elsevier Interactive Patient Education  2017 [employer].

## 2020-09-08 ENCOUNTER — Ambulatory Visit: Payer: Medicare Other

## 2020-09-08 DIAGNOSIS — R42 Dizziness and giddiness: Secondary | ICD-10-CM

## 2020-09-11 ENCOUNTER — Other Ambulatory Visit: Payer: Self-pay

## 2020-09-11 ENCOUNTER — Ambulatory Visit: Payer: Medicare Other | Attending: Neurology

## 2020-09-11 DIAGNOSIS — R42 Dizziness and giddiness: Secondary | ICD-10-CM | POA: Insufficient documentation

## 2020-09-11 DIAGNOSIS — R2681 Unsteadiness on feet: Secondary | ICD-10-CM | POA: Diagnosis not present

## 2020-09-11 NOTE — Therapy (Signed)
[facility] [address]. [place], [place], [address] Phone: [phone]   Fax:  [phone]  Physical Therapy Evaluation  Patient Details  Name: Melissa Daniels MRN: [id number] Date of Birth: 05/09/1946 Referring Provider (PT): Dr. Erin Sons   Encounter Date: 09/11/2020   PT End of Session - 09/11/20 0813    Visit Number 1    Number of Visits 17    Date for PT Re-Evaluation 11/06/20    Authorization Type eval: 09/11/20    PT Start Time 0800    PT Stop Time 0845    PT Time Calculation (min) 45 min    Equipment Utilized During Treatment Gait belt    Activity Tolerance Patient tolerated treatment well    Behavior During Therapy [facility] for tasks assessed/performed           Past Medical History:  Diagnosis Date  . Anxiety   . Arthritis    hands  . Double vision   . Hypercholesteremia   . ITP (idiopathic thrombocytopenic purpura)   . Malignant neoplasm of upper-outer quadrant of left breast in female, estrogen receptor positive ([place]) 01/10/2017  . Neuropathy    bilateral feet  . Osteoporosis   . Vertigo    several episodes per year    Past Surgical History:  Procedure Laterality Date  . BREAST BIOPSY Left 12/28/2016   stereo path pend  . BREAST LUMPECTOMY    . COLONOSCOPY WITH PROPOFOL N/A 09/08/2017   Procedure: COLONOSCOPY WITH PROPOFOL;  Surgeon: Lucilla Lame, MD;  Location: [place];  Service: Endoscopy;  Laterality: N/A;  . COLONOSCOPY WITH PROPOFOL N/A 09/03/2020   Procedure: COLONOSCOPY WITH PROPOFOL;  Surgeon: Lucilla Lame, MD;  Location: [place];  Service: Endoscopy;  Laterality: N/A;  priority 4  . POLYPECTOMY  09/08/2017   Procedure: POLYPECTOMY;  Surgeon: Lucilla Lame, MD;  Location: [place];  Service: Endoscopy;;  . SPLENECTOMY, TOTAL      There were no vitals filed for this visit.    Subjective Assessment - 09/11/20 0804    Subjective Imbalance    Pertinent History  Pt complains of difficulty with balance and she would like to see if physical therapy would help. She started having imbalance/dizziness/double vision in July 2019. Per medical record at onset of symptoms she had abnormal brain MRI with a left occipital lobe periventricular lesion that had associated contrast enhancement. 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 On repeat imaging, this lesion self-resolved though she continued to have diplopia. It has been postulated that a nerve injury could have resulted in atrophy of an ocular muscle (diagnosed with L eye abducens palsy) leading to her persistent symptoms. Her physicians have not found evidence of a relapsing demyelinating disorder such as MS, paraneoplastic syndrome or myasthenia gravis after extensive workup. Pt reports that she continues having difficulty with her double vision and that the prism lenses are intermittently helpful. She has discussed possible surgical correction but doesn't think she will proceed with surgery. She has no limitations with respect to her balance but reports some occasional staggering. She has to wait a few seconds if she bends over and then stands up quickly and also struggles with steps. Denies any vertigo currently but does have a history of vertigo in the past. She went to Dr. Kathyrn Sheriff at [facility] and had VNG testing on 08/14/2018 which revealed 34% left [facility] per medical record. 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 She takes gabapentin and Cymbalta to help with the pain related to her neuropathy. Her last eye exam was in 2022 and she has had an MRI this year which did not show any significant findings. She complains of worsening osteoarthritis in her hands over the last couple years. She walks on her treadmill and works in the yard for exercise. No falls in the last 6 months.     Limitations Walking    Diagnostic tests See history    Patient Stated Goals Improve balance    Currently in Pain? Other (Comment)   Unrelated to episode             [facility] PT Assessment - 09/11/20 0805      Assessment   Medical Diagnosis Imbalance    Referring Provider (PT) Dr. 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, volunteers at [employer], active in [place]   Overall Cognitive Status Within [place] for tasks assessed      [place] Test   Sit to Stand Able to stand without using hands and stabilize independently    Standing Unsupported Able to stand safely 2 minutes    Sitting with Back Unsupported but Feet Supported on Floor or Stool Able to sit safely and securely 2 minutes    Stand to Sit Sits safely with minimal use of hands    Transfers Able to transfer safely, minor use of hands    Standing Unsupported with Eyes Closed Able to stand 10 seconds safely    Standing Unsupported with Feet Together Able to place feet together independently and stand 1 minute safely    From Standing, Reach Forward with Outstretched Arm Can reach confidently >25 cm (10")    From Standing Position, Pick up Object from Floor Able to pick up shoe safely and easily    From Standing Position, Turn to Look Behind Over each Shoulder Looks  behind from both sides and weight shifts well    Turn 360 Degrees Able to turn 360 degrees safely in 4 seconds or less    Standing Unsupported, Alternately Place Feet on Step/Stool Able to stand independently and safely and complete 8 steps in 20 seconds    Standing Unsupported, One Foot in Front Able to plae foot ahead of the other independently and hold 30 seconds    Standing on One Leg Able to lift leg independently and hold 5-10 seconds    Total Score 54                 SUBJECTIVE Chief complaint: "I just have trouble with my  balance."  Onset: Pt complains of difficulty with balance and she would like to see if physical therapy would help. She started having imbalance/dizziness/double vision in July 2019. Per medical record at onset of symptoms she had abnormal brain MRI with a left occipital lobe periventricular lesion that had associated contrast enhancement. 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 On repeat imaging, this lesion self-resolved though she continued to have diplopia. It has been postulated that a nerve injury could have resulted in atrophy of an ocular muscle (diagnosed with L eye abducens palsy) leading to her persistent symptoms. Her physicians have not found evidence of a relapsing demyelinating disorder such as MS, paraneoplastic syndrome or myasthenia gravis after extensive workup. Pt reports that she continues having difficulty with her double vision and that the prism lenses are intermittently helpful. She has discussed possible surgical correction but doesn't think she will proceed with surgery. She has no limitations with respect to her balance but reports some occasional staggering. She has to wait a few seconds if she bends over and then stands up quickly and also struggles with steps. Denies any vertigo currently but does have a history of vertigo in the past. She went to Dr. Kathyrn Sheriff at [facility] and  had VNG testing on 08/14/2018 which revealed 34% left [facility] per medical record. 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 She takes gabapentin and Cymbalta to help with the pain related to her neuropathy. Her last eye exam was in 2022 and she has had an MRI this year which did not show any significant findings. She complains of worsening osteoarthritis in her hands over the last couple years. She walks on her treadmill and works in the yard for exercise. No falls in the last 6 months.  Recent changes in overall health/medication: No Directional pattern for falls: None Prior history of physical therapy for balance: None Follow-up appointment with MD: Neuroopthomalogy 10/22/20 Red flags (bowel/bladder changes, saddle paresthesia, personal history of cancer, chills/fever, night sweats, unrelenting pain) Negative   OBJECTIVE  MUSCULOSKELETAL: Tremor: Absent Bulk: Normal Tone: Normal, no clonus  Posture No gross abnormalities noted in standing or seated posture  Gait No gross abnormalities in gait noted  Strength R/L 5/5 Hip flexion Strong and painless bilateral hip abduction in sitting Strong and painless bilateral hip adduction in sitting 5/5 Knee extension 5/5 Knee flexion (sitting) Active bilateral ankle plantarflexion 5/5 Ankle Dorsiflexion UE strong grossly WNL (4+ to 5/5 throughout) without focal weakness, slight decrease in grip strength bilaterally;    NEUROLOGICAL:  Mental Status Patient is oriented to person, place and time.  Recent memory is intact.  Remote memory is intact.  Attention span and concentration are intact.  Expressive speech is intact.  Patient's fund of knowledge is within normal limits for educational level.  Cranial Nerves Double vision in L visual field reported with gross examination; Extraocular muscles appear grossly intact, history of L abducens palsy. Decreased  vertical gaze noted bilaterally with finger follow. Pt does appear to possibly have some L horizontal beating nystagmus with L gaze. Resting esotropia or exotropia noted either at rest or with cross cover test;  Facial sensation is intact bilaterally  Facial strength is intact bilaterally  Hearing is normal as tested by gross conversation Palate elevates midline, normal phonation  Shoulder shrug strength is intact  Tongue protrudes midline  Convergence  is approximately 4" with prism lenses  Sensation Grossly intact to light touch bilateral UEs/LEs as determined by testing dermatomes C2-T2/L2-S2 respectively Proprioception and hot/cold testing deferred on this date  Reflexes Deferred  Coordination/Cerebellar Finger to Nose: WNL Heel to Shin: WNL Rapid alternating movements: WNL Finger Opposition: WNL Pronator Drift: Negative   FUNCTIONAL OUTCOME MEASURES   Results Comments  BERG 54/56 Minimal fall risk  DGI NT   FGA NT   5TSTS 10.7 seconds WNL  10 Meter Gait Speed NT   ABC Scale 59.375% Below cut-off  FOTO 60 Predicted decline to 59    POSTURAL CONTROL TESTS   Modified Clinical Test of Sensory Interaction for Balance    (CTSIB):  CONDITION TIME STRATEGY SWAY  Eyes open, firm surface 30 seconds ankle 2+  Eyes closed, firm surface 30 seconds ankle 2+  Eyes open, foam surface 30 seconds ankle 3+  Eyes closed, foam surface 18 seconds ankle 4+                     Objective measurements completed on examination: See above findings.               PT Education - 09/11/20 0813    Education Details Plan of care    Person(s) Educated Patient    Methods Explanation    Comprehension Verbalized understanding            PT Short Term Goals - 09/11/20 1116      PT SHORT TERM GOAL #1   Title Pt will be independent with HEP in order to improve strength and balance in order to decrease fall risk and improve function at home.    Time 4    Period  Weeks    Status New    Target Date 10/09/20             PT Long Term Goals - 09/11/20 1117      PT LONG TERM GOAL #1   Title Pt will improve single leg balance to at least 20s bilaterally in order to decrease fall risk and improve her ability to work in the yard safely.    Baseline 09/11/20: RLE: 6-7s, LLE: 8-9s    Time 8    Period Weeks    Status New    Target Date 11/06/20      PT LONG TERM GOAL #2   Title Pt will improve ABC by at least 13% in order to demonstrate clinically significant improvement in balance confidence.    Baseline 09/11/20: 59.375%    Time 8    Period Weeks    Status New    Target Date 11/06/20      PT LONG TERM GOAL #3   Title Pt will improve DGI by at least 3 points in order to demonstrate clinically significant improvement in balance and decreased risk for falls    Baseline 09/11/20: To be performed at next visit    Time 8    Period Weeks    Status New    Target Date 11/06/20                  Plan - 09/11/20 1112    Clinical Impression Statement Pt is a pleasant 75 year-old female referred for difficulty with balance. PT examination reveals mild balance deficits as indicated by limited single-leg stance. BERG score was 54/56. On the mCTSIB she struggled during condition 4 (foam, eyes closed) with severe sway followed by eventual loss of  balance.  Low balance confidence with patient scoring 59.375% on the ABC scale.  Due to time constraints deferred additional balance testing to first follow-up visit.  Therapist will also perform fixation suppression oculomotor/vestibular testing at that time and screen for BPPV. Pt will benefit from skilled PT services to address deficits in balance, decrease risk for future falls, and improve function with household responsibilities and leisure activities.    Personal Factors and Comorbidities Age;Comorbidity 3+    Comorbidities OA, vertigo, neuropathy, diplopia, anxiety    Examination-Activity Limitations  Stairs;Transfers    Examination-Participation Restrictions Church;Community Activity;Driving    Stability/Clinical Decision Making Stable/Uncomplicated    Clinical Decision Making Low    Rehab Potential Good    PT Frequency 2x / week    PT Duration 8 weeks    PT Treatment/Interventions ADLs/Self Care Home Management;Aquatic Therapy;Biofeedback;Cryotherapy;Electrical Stimulation;Canalith Repostioning;Iontophoresis 4mg /ml Dexamethasone;Moist Heat;Traction;Ultrasound;DME Instruction;Gait training;Stair training;Functional mobility training;Therapeutic activities;Therapeutic exercise;Balance training;Neuromuscular re-education;Cognitive remediation;Patient/family education;Manual techniques;Passive range of motion;Dry needling;Vestibular;Visual/perceptual remediation/compensation;Spinal Manipulations    PT Next Visit Plan DGI/FGA, 78m gait speed, fixation suppression oculomotor/vestibular testing, BPPV screen, initiate HEP    PT Home Exercise Plan None currently    Consulted and Agree with Plan of Care Patient           Patient will benefit from skilled therapeutic intervention in order to improve the following deficits and impairments:  Decreased balance  Visit Diagnosis: Unsteadiness on feet     Problem List Patient Active Problem List   Diagnosis Date Noted  . 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  . Dry eye syndrome of both eyes 08/06/2020  . Binocular vision disorder with diplopia 08/06/2020  . Sixth (abducent) nerve palsy, 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  . Esotropia 03/26/2018  . Abscess of finger of right hand   . Abscess of left forearm   . Cellulitis 10/21/2017  . 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 Personal history of colonic polyps   . Benign neoplasm of cecum   . Benign neoplasm of transverse colon   . Benign neoplasm of ascending colon   . Malignant neoplasm of upper-outer quadrant of left breast in female, estrogen receptor positive ([place]) 01/10/2017  . Neuropathy 10/31/2016  . Taking  medication for chronic disease 10/31/2016  . Primary osteoarthritis of right hip 10/31/2016  . 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[place]) 10/19/2015  . Menopause 10/19/2015  . Hormone replacement therapy (HRT) 10/19/2015  . 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 Routine general medical examination at a health care facility 10/10/2014  . 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 Female stress incontinence 10/10/2014  . Colon polyp 10/10/2014  . Episodic paroxysmal anxiety disorder 10/10/2014   Phillips Grout PT, DPT, GCS  Leyton Magoon 09/11/2020, 11:38 AM  [place] [facility] [address] [place], [place], [address] Phone: [phone]   Fax:  [phone]  Name: Melissa Daniels MRN: [id number] Date of Birth: 1946-01-26

## 2020-09-14 NOTE — Patient Instructions (Addendum)
Access Code: [email or web address] URL: [email or web address] Date: 09/15/2020 Prepared by: Roxana Hires  Exercises Seated Gaze Stabilization with Head Rotation - 4 x daily - 7 x weekly - 3 reps - 30 seconds hold Tandem Walking Next to Counter - 2 x daily - 7 x weekly - 3 reps - 1 minute hold Tandem Stance with Head Rotation - 2 x daily - 7 x weekly - 3 x 30s with each foot forward hold

## 2020-09-15 ENCOUNTER — Other Ambulatory Visit: Payer: Self-pay

## 2020-09-15 ENCOUNTER — Ambulatory Visit: Payer: Medicare Other

## 2020-09-15 DIAGNOSIS — R2681 Unsteadiness on feet: Secondary | ICD-10-CM

## 2020-09-15 DIAGNOSIS — R42 Dizziness and giddiness: Secondary | ICD-10-CM | POA: Diagnosis not present

## 2020-09-15 NOTE — Therapy (Signed)
[facility] [address] [place], [place], [address] Phone: [phone]   Fax:  [phone]  Physical Therapy Treatment  Patient Details  Name: Patches Mcdonnell MRN: [id number] Date of Birth: 09/01/1945 Referring Provider (PT): Dr. Erin Sons   Encounter Date: 09/15/2020   PT End of Session - 09/15/20 0936    Visit Number 2    Number of Visits 17    Date for PT Re-Evaluation 11/06/20    Authorization Type eval: 09/11/20    PT Start Time 0932    PT Stop Time 1015    PT Time Calculation (min) 43 min    Equipment Utilized During Treatment Gait belt    Activity Tolerance Patient tolerated treatment well    Behavior During Therapy [facility] for tasks assessed/performed           Past Medical History:  Diagnosis Date  . Anxiety   . Arthritis    hands  . Double vision   . Hypercholesteremia   . ITP (idiopathic thrombocytopenic purpura)   . Malignant neoplasm of upper-outer quadrant of left breast in female, estrogen receptor positive ([place]) 01/10/2017  . Neuropathy    bilateral feet  . Osteoporosis   . Vertigo    several episodes per year    Past Surgical History:  Procedure Laterality Date  . BREAST BIOPSY Left 12/28/2016   stereo path pend  . BREAST LUMPECTOMY    . COLONOSCOPY WITH PROPOFOL N/A 09/08/2017   Procedure: COLONOSCOPY WITH PROPOFOL;  Surgeon: Lucilla Lame, MD;  Location: [place];  Service: Endoscopy;  Laterality: N/A;  . COLONOSCOPY WITH PROPOFOL N/A 09/03/2020   Procedure: COLONOSCOPY WITH PROPOFOL;  Surgeon: Lucilla Lame, MD;  Location: [place];  Service: Endoscopy;  Laterality: N/A;  priority 4  . POLYPECTOMY  09/08/2017   Procedure: POLYPECTOMY;  Surgeon: Lucilla Lame, MD;  Location: [place];  Service: Endoscopy;;  . SPLENECTOMY, TOTAL      There were no vitals filed for this visit.   Subjective Assessment - 09/15/20 0936    Subjective Pt reports that he is doing well  today. No changes since the initial evaluation. No specific questions or concerns at this time.    Pertinent History Pt complains of difficulty with balance and she would like to see if physical therapy would help. She started having imbalance/dizziness/double vision in July 2019. Per medical record at onset of symptoms she had abnormal brain MRI with a left occipital lobe periventricular lesion that had associated contrast enhancement. 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 On repeat imaging, this lesion self-resolved though she continued to have diplopia. It has been postulated that a nerve injury could have resulted in atrophy of an ocular muscle (diagnosed with L eye abducens palsy) leading to her persistent symptoms. Her physicians have not found evidence of a relapsing demyelinating disorder such as MS, paraneoplastic syndrome or myasthenia gravis after extensive workup. Pt reports that she continues having difficulty with her double vision and that the prism lenses are intermittently helpful. She has discussed possible surgical correction but doesn't think she will proceed with surgery. She has no limitations with respect to her balance but reports some occasional staggering. She has to wait a few seconds if she bends over and then stands up quickly and also struggles with steps. Denies any vertigo currently but does have a history of vertigo in the past. She went to Dr.  Juengel at [facility] and had VNG testing on 08/14/2018 which revealed 34% left [facility] per medical record. 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 She takes gabapentin and Cymbalta to help with the pain related to her neuropathy. Her last eye exam was in 2022 and she has had an MRI this year which did not show any significant findings. She complains of worsening osteoarthritis in her hands over the  last couple years. She walks on her treadmill and works in the yard for exercise. No falls in the last 6 months.    Limitations Walking    Diagnostic tests See history    Patient Stated Goals Improve balance    Currently in Pain? No/denies                TREATMENT   Neuromuscular Re-education    Oculomotor Exam- Fixation Suppressed  Findings Comments  Ocular Alignment normal   Spontaneous Nystagmus normal   Gaze-Holding Nystagmus normal   End-Gaze Nystagmus normal   Head Shaking Nystagmus normal   Pressure-Induced Nystagmus normal   Hyperventilation Induced Nystagmus not examined   Skull Vibration Induced Nystagmus not examined     BPPV TESTS:  Symptoms Duration Intensity Nystagmus  L Dix-Hallpike None   None  R Dix-Hallpike None   None  L Head Roll None   None  R Head Roll None   None  L Sidelying Test      R Sidelying Test        FGA: 26/30 66m gait speed: self-selected: 9.1s = 1.1 m/s; Seated VOR x 1 horizontal 60s x 3; Standing tandem gait x 15'; Standing tandem balance with horizontal head turns alternating forward LE x 30s each;    Updated additional outcome measures with patient today. She scored 26/30 on the FGA today with most difficulty performing horizontal and vertical head turns during gait as well as eyes closed ambulation. Self-selected 54m gait speed is WNL for full community ambulation. BPPV screening is negative and no significant findings with fixation suppression oculomotor/vestibular testing using infrared goggles. Initiated HEP including VOR x 1 horizontal in sitting, tandem gait, and tandem/semitandem balance with horizontal head turns. Pt encouraged to follow-up as scheduled. Pt will benefit from PT services to address deficits in balance and mobility in order to return to full function at home.                                PT Short Term Goals - 09/11/20 1116      PT SHORT TERM GOAL #1   Title Pt will be  independent with HEP in order to improve strength and balance in order to decrease fall risk and improve function at home.    Time 4    Period Weeks    Status New    Target Date 10/09/20             PT Long Term Goals - 09/15/20 1026      PT LONG TERM GOAL #1   Title Pt will improve single leg balance to at least 20s bilaterally in order to decrease fall risk and improve her ability to work in the yard safely.    Baseline 09/11/20: RLE: 6-7s, LLE: 8-9s    Time 8    Period Weeks    Status New    Target Date 11/06/20  PT LONG TERM GOAL #2   Title Pt will improve ABC by at least 13% in order to demonstrate clinically significant improvement in balance confidence.    Baseline 09/11/20: 59.375%    Time 8    Period Weeks    Status New    Target Date 11/06/20      PT LONG TERM GOAL #3   Title Pt will improve FGA to 30/30 in order to demonstrate clinically significant improvement in balance and decreased risk for falls    Baseline 09/11/20: To be performed at next visit; 09/15/20: 26/30    Time 8    Period Weeks    Status Revised    Target Date 11/06/20                 Plan - 09/15/20 5366    Clinical Impression Statement Updated additional outcome measures with patient today. She scored 26/30 on the FGA today with most difficulty performing horizontal and vertical head turns during gait as well as eyes closed ambulation. Self-selected 66m gait speed is WNL for full community ambulation. BPPV screening is negative and no significant findings with fixation suppression oculomotor/vestibular testing using infrared goggles. Initiated HEP including VOR x 1 horizontal in sitting, tandem gait, and tandem/semitandem balance with horizontal head turns. Pt encouraged to follow-up as scheduled. Pt will benefit from PT services to address deficits in balance and mobility in order to return to full function at home.    Personal Factors and Comorbidities Age;Comorbidity 3+    Comorbidities  OA, vertigo, neuropathy, diplopia, anxiety    Examination-Activity Limitations Stairs;Transfers    Examination-Participation Restrictions Church;Community Activity;Driving    Stability/Clinical Decision Making Stable/Uncomplicated    Rehab Potential Good    PT Frequency 2x / week    PT Duration 8 weeks    PT Treatment/Interventions ADLs/Self Care Home Management;Aquatic Therapy;Biofeedback;Cryotherapy;Electrical Stimulation;Canalith Repostioning;Iontophoresis 4mg /ml Dexamethasone;Moist Heat;Traction;Ultrasound;DME Instruction;Gait training;Stair training;Functional mobility training;Therapeutic activities;Therapeutic exercise;Balance training;Neuromuscular re-education;Cognitive remediation;Patient/family education;Manual techniques;Passive range of motion;Dry needling;Vestibular;Visual/perceptual remediation/compensation;Spinal Manipulations    PT Next Visit Plan Review HEP, progress balance exercises    PT Home Exercise Plan None currently    Consulted and Agree with Plan of Care Patient           Patient will benefit from skilled therapeutic intervention in order to improve the following deficits and impairments:  Decreased balance  Visit Diagnosis: Unsteadiness on feet     Problem List Patient Active Problem List   Diagnosis Date Noted  . 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  . Dry eye syndrome of both eyes 08/06/2020  . Binocular vision disorder with diplopia 08/06/2020  . Sixth (abducent) nerve palsy, 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  . Esotropia 03/26/2018  . Abscess of finger of right hand   . Abscess of left forearm   . Cellulitis 10/21/2017  . 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 Personal history of colonic polyps   . Benign neoplasm of cecum   . Benign neoplasm of transverse colon   . Benign neoplasm of ascending colon   . Malignant neoplasm of upper-outer quadrant of left breast in female, estrogen receptor positive ([place]) 01/10/2017  . Neuropathy 10/31/2016  . Taking medication for chronic  disease 10/31/2016  . Primary osteoarthritis of right hip 10/31/2016  . 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[place]) 10/19/2015  . Menopause 10/19/2015  . Hormone replacement therapy (HRT) 10/19/2015  . 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 Routine general medical examination at a health care facility 10/10/2014  . 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 Female stress incontinence 10/10/2014  .  Colon polyp 10/10/2014  . Episodic paroxysmal anxiety disorder 10/10/2014   Phillips Grout PT, DPT, GCS  Yared Susan 09/15/2020, 10:29 AM  [facility] [address] [place], [place], [address] Phone: [phone]   Fax:  [phone]  Name: Dejanay Wamboldt MRN: [id number] Date of Birth: 1946-02-22

## 2020-09-17 ENCOUNTER — Ambulatory Visit: Payer: Medicare Other

## 2020-09-17 ENCOUNTER — Other Ambulatory Visit: Payer: Self-pay

## 2020-09-17 DIAGNOSIS — R2681 Unsteadiness on feet: Secondary | ICD-10-CM | POA: Diagnosis not present

## 2020-09-17 DIAGNOSIS — R42 Dizziness and giddiness: Secondary | ICD-10-CM | POA: Diagnosis not present

## 2020-09-17 NOTE — Patient Instructions (Signed)
Access Code: [email or web address] URL: [email or web address] Date: 09/17/2020 Prepared by: Roxana Hires  Exercises Tandem Walking Next to Counter - 2 x daily - 7 x weekly - 3 reps - 1 minute hold Tandem Stance with Head Rotation - 2 x daily - 7 x weekly - 3 x 30s with each foot forward hold Standing Gaze Stabilization with Head Nod - 4 x daily - 7 x weekly - 3 reps - 60 seconds hold

## 2020-09-17 NOTE — Therapy (Signed)
[place] [address] [place], [place], [address] Phone: [phone]   Fax:  [phone]  Physical Therapy Treatment  Patient Details  Name: Melissa Daniels MRN: [id number] Date of Birth: Mar 20, 1946 Referring Provider (PT): Dr. Erin Sons   Encounter Date: 09/17/2020   PT End of Session - 09/17/20 1004    Visit Number 3    Number of Visits 17    Date for PT Re-Evaluation 11/06/20    Authorization Type eval: 09/11/20    PT Start Time 0908    PT Stop Time 0953    PT Time Calculation (min) 45 min    Equipment Utilized During Treatment Gait belt    Activity Tolerance Patient tolerated treatment well    Behavior During Therapy [facility] for tasks assessed/performed           Past Medical History:  Diagnosis Date  . Anxiety   . Arthritis    hands  . Double vision   . Hypercholesteremia   . ITP (idiopathic thrombocytopenic purpura)   . Malignant neoplasm of upper-outer quadrant of left breast in female, estrogen receptor positive ([place]) 01/10/2017  . Neuropathy    bilateral feet  . Osteoporosis   . Vertigo    several episodes per year    Past Surgical History:  Procedure Laterality Date  . BREAST BIOPSY Left 12/28/2016   stereo path pend  . BREAST LUMPECTOMY    . COLONOSCOPY WITH PROPOFOL N/A 09/08/2017   Procedure: COLONOSCOPY WITH PROPOFOL;  Surgeon: Lucilla Lame, MD;  Location: [place];  Service: Endoscopy;  Laterality: N/A;  . COLONOSCOPY WITH PROPOFOL N/A 09/03/2020   Procedure: COLONOSCOPY WITH PROPOFOL;  Surgeon: Lucilla Lame, MD;  Location: [place];  Service: Endoscopy;  Laterality: N/A;  priority 4  . POLYPECTOMY  09/08/2017   Procedure: POLYPECTOMY;  Surgeon: Lucilla Lame, MD;  Location: [place];  Service: Endoscopy;;  . SPLENECTOMY, TOTAL      There were no vitals filed for this visit.   Subjective Assessment - 09/17/20 1002    Subjective Pt reports that he is doing well  today. No changes since last therapy session. She was able to perform her HEP but more focus on tandem balance than gaze stabilization exercise. No specific questions or concerns at this time.    Pertinent History Pt complains of difficulty with balance and she would like to see if physical therapy would help. She started having imbalance/dizziness/double vision in July 2019. Per medical record at onset of symptoms she had abnormal brain MRI with a left occipital lobe periventricular lesion that had associated contrast enhancement. 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 On repeat imaging, this lesion self-resolved though she continued to have diplopia. It has been postulated that a nerve injury could have resulted in atrophy of an ocular muscle (diagnosed with L eye abducens palsy) leading to her persistent symptoms. Her physicians have not found evidence of a relapsing demyelinating disorder such as MS, paraneoplastic syndrome or myasthenia gravis after extensive workup. Pt reports that she continues having difficulty with her double vision and that the prism lenses are intermittently helpful. She has discussed possible surgical correction but doesn't think she will proceed with surgery. She has no limitations with respect to her balance but reports some occasional staggering. She has to wait a few seconds if she bends over and then stands up quickly and also struggles with steps. Denies  any vertigo currently but does have a history of vertigo in the past. She went to Dr. Kathyrn Sheriff at [facility] and had VNG testing on 08/14/2018 which revealed 34% left [facility] per medical record. 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 She takes gabapentin and Cymbalta to help with the pain related to her neuropathy. Her last eye exam was in 2022 and she has had an MRI this year which did  not show any significant findings. She complains of worsening osteoarthritis in her hands over the last couple years. She walks on her treadmill and works in the yard for exercise. No falls in the last 6 months.    Limitations Walking    Diagnostic tests See history    Patient Stated Goals Improve balance    Currently in Pain? No/denies               TREATMENT   Neuromuscular Re-education  NuStep L2 x 5 minutes for warm-up during history (unbilled);   All balance exercises perform in // bars without UE support   Tandem balance alternating forward LE x 30s each; Tandem gait in // bars x 4 lengths; Tandem balance with horizontal and vertical head turns alternating forward LE x 30s each; Airex balance beam tandem gait x 6 lengths; Airex balance beam side stepping x 6 lengths; Airex balance beam side stepping with horizontal and vertical head turns x 4 lengths each; Standing VOR x 1 horizontal 60s x 3, pt reports minimal dizziness at the end of each repetition; Gait in hallway with horizontal ball tosses to therapist with head/eye follow x 150' each direction; Forward and retro gait in hallway with vertical toss to self with head/eye follow x 150' each; Airex alternating 6" step taps x 10 BLE; Airex alternating 6" cone taps x 10 BLE;   Pt educated throughout session about proper posture and technique with exercises. Improved exercise technique, movement at target joints, use of target muscles after min to mod verbal, visual, tactile cues.    Pt demonstrates excellent motivation during session today. Progressed gaze stabilization exercise to standing and pt does report minimal dizziness. She struggles today with horizontal head turns during tandem balance and also during ball tosses with gait in hallway. HEP progressed with patient today. Pt encouraged to follow-up as scheduled. Pt will benefit from PT services to address deficits in balance and mobility in order to return to full  function at home.                   PT Education - 09/17/20 1003    Education Details HEP modification    Person(s) Educated Patient    Methods Explanation    Comprehension Verbalized understanding            PT Short Term Goals - 09/11/20 1116      PT SHORT TERM GOAL #1   Title Pt will be independent with HEP in order to improve strength and balance in order to decrease fall risk and improve function at home.    Time 4    Period Weeks    Status New    Target Date 10/09/20             PT Long Term Goals - 09/15/20 1026      PT LONG TERM GOAL #1   Title Pt will improve single leg balance to at least 20s bilaterally in  order to decrease fall risk and improve her ability to work in the yard safely.    Baseline 09/11/20: RLE: 6-7s, LLE: 8-9s    Time 8    Period Weeks    Status New    Target Date 11/06/20      PT LONG TERM GOAL #2   Title Pt will improve ABC by at least 13% in order to demonstrate clinically significant improvement in balance confidence.    Baseline 09/11/20: 59.375%    Time 8    Period Weeks    Status New    Target Date 11/06/20      PT LONG TERM GOAL #3   Title Pt will improve FGA to 30/30 in order to demonstrate clinically significant improvement in balance and decreased risk for falls    Baseline 09/11/20: To be performed at next visit; 09/15/20: 26/30    Time 8    Period Weeks    Status Revised    Target Date 11/06/20                 Plan - 09/17/20 0929    Clinical Impression Statement Pt demonstrates excellent motivation during session today. Progressed gaze stabilization exercise to standing and pt does report minimal dizziness. She struggles today with horizontal head turns during tandem balance and also during ball tosses with gait in hallway. HEP progressed with patient today. Pt encouraged to follow-up as scheduled. Pt will benefit from PT services to address deficits in balance and mobility in order to return to full  function at home.    Personal Factors and Comorbidities Age;Comorbidity 3+    Comorbidities OA, vertigo, neuropathy, diplopia, anxiety    Examination-Activity Limitations Stairs;Transfers    Examination-Participation Restrictions Church;Community Activity;Driving    Stability/Clinical Decision Making Stable/Uncomplicated    Rehab Potential Good    PT Frequency 2x / week    PT Duration 8 weeks    PT Treatment/Interventions ADLs/Self Care Home Management;Aquatic Therapy;Biofeedback;Cryotherapy;Electrical Stimulation;Canalith Repostioning;Iontophoresis 4mg /ml Dexamethasone;Moist Heat;Traction;Ultrasound;DME Instruction;Gait training;Stair training;Functional mobility training;Therapeutic activities;Therapeutic exercise;Balance training;Neuromuscular re-education;Cognitive remediation;Patient/family education;Manual techniques;Passive range of motion;Dry needling;Vestibular;Visual/perceptual remediation/compensation;Spinal Manipulations    PT Next Visit Plan Review HEP, progress balance exercises    PT Home Exercise Plan Access Code: [email or web address]    Consulted and Agree with Plan of Care Patient           Patient will benefit from skilled therapeutic intervention in order to improve the following deficits and impairments:  Decreased balance  Visit Diagnosis: Unsteadiness on feet     Problem List Patient Active Problem List   Diagnosis Date Noted  . 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  . Dry eye syndrome of both eyes 08/06/2020  . Binocular vision disorder with diplopia 08/06/2020  . Sixth (abducent) nerve palsy, 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  . Esotropia 03/26/2018  . Abscess of finger of right hand   . Abscess of left forearm   . Cellulitis 10/21/2017  . 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 Personal history of colonic polyps   . Benign neoplasm of cecum   . Benign neoplasm of transverse colon   . Benign neoplasm of ascending colon   . Malignant neoplasm of upper-outer quadrant of left breast in female,  estrogen receptor positive ([place]) 01/10/2017  . Neuropathy 10/31/2016  . Taking medication for chronic disease 10/31/2016  . Primary osteoarthritis of right hip 10/31/2016  . 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[place]) 10/19/2015  . Menopause 10/19/2015  . Hormone replacement therapy (HRT) 10/19/2015  . 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 Routine general  medical examination at a health care facility 10/10/2014  . 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 Female stress incontinence 10/10/2014  . Colon polyp 10/10/2014  . Episodic paroxysmal anxiety disorder 10/10/2014   Melissa Daniels PT, DPT, GCS  Melissa Daniels 09/17/2020, 10:07 AM  [facility] [address] [place], [place], [address] Phone: [phone]   Fax:  [phone]  Name: Melissa Daniels MRN: [id number] Date of Birth: 09/04/1945

## 2020-09-18 ENCOUNTER — Ambulatory Visit: Payer: Medicare Other

## 2020-09-21 NOTE — Patient Instructions (Addendum)
Access Code: [email or web address] URL: [email or web address] Date: 09/22/2020 Prepared by: Roxana Hires  Exercises Tandem Walking Next to Counter - 2 x daily - 7 x weekly - 3 reps - 1 minute hold Tandem Stance with Head Rotation - 2 x daily - 7 x weekly - 3 x 30s with each foot forward hold Standing Gaze Stabilization with Head Nod - 4 x daily - 7 x weekly - 3 reps - 60 seconds hold Standing Single Leg Stance with Counter Support - 2 x daily - 7 x weekly - 3 x 30s on each leg hold

## 2020-09-22 ENCOUNTER — Ambulatory Visit: Payer: Medicare Other

## 2020-09-22 ENCOUNTER — Other Ambulatory Visit: Payer: Self-pay

## 2020-09-22 DIAGNOSIS — R42 Dizziness and giddiness: Secondary | ICD-10-CM

## 2020-09-22 DIAGNOSIS — R2681 Unsteadiness on feet: Secondary | ICD-10-CM | POA: Diagnosis not present

## 2020-09-22 NOTE — Therapy (Signed)
[facility] [address]. [place], [place], [address] Phone: [phone]   Fax:  [phone]  Physical Therapy Treatment  Patient Details  Name: Melissa Daniels MRN: [id number] Date of Birth: 1945-11-08 Referring Provider (PT): Dr. Erin Sons   Encounter Date: 09/22/2020   PT End of Session - 09/22/20 1058    Visit Number 4    Number of Visits 17    Date for PT Re-Evaluation 11/06/20    Authorization Type eval: 09/11/20    PT Start Time 0847    PT Stop Time 0930    PT Time Calculation (min) 43 min    Equipment Utilized During Treatment Gait belt    Activity Tolerance Patient tolerated treatment well    Behavior During Therapy [facility] for tasks assessed/performed           Past Medical History:  Diagnosis Date  . Anxiety   . Arthritis    hands  . Double vision   . Hypercholesteremia   . ITP (idiopathic thrombocytopenic purpura)   . Malignant neoplasm of upper-outer quadrant of left breast in female, estrogen receptor positive ([place]) 01/10/2017  . Neuropathy    bilateral feet  . Osteoporosis   . Vertigo    several episodes per year    Past Surgical History:  Procedure Laterality Date  . BREAST BIOPSY Left 12/28/2016   stereo path pend  . BREAST LUMPECTOMY    . COLONOSCOPY WITH PROPOFOL N/A 09/08/2017   Procedure: COLONOSCOPY WITH PROPOFOL;  Surgeon: Lucilla Lame, MD;  Location: [place];  Service: Endoscopy;  Laterality: N/A;  . COLONOSCOPY WITH PROPOFOL N/A 09/03/2020   Procedure: COLONOSCOPY WITH PROPOFOL;  Surgeon: Lucilla Lame, MD;  Location: [place];  Service: Endoscopy;  Laterality: N/A;  priority 4  . POLYPECTOMY  09/08/2017   Procedure: POLYPECTOMY;  Surgeon: Lucilla Lame, MD;  Location: [place];  Service: Endoscopy;;  . SPLENECTOMY, TOTAL      There were no vitals filed for this visit.   Subjective Assessment - 09/22/20 0859    Subjective Pt reports that she is doing well  today. No changes since last therapy session. No falls and no pain upon upon arrival. She was able to perform her HEP and devoted more focus to gaze stabilization exercises as advised. No specific questions or concerns at this time.    Pertinent History Pt complains of difficulty with balance and she would like to see if physical therapy would help. She started having imbalance/dizziness/double vision in July 2019. Per medical record at onset of symptoms she had abnormal brain MRI with a left occipital lobe periventricular lesion that had associated contrast enhancement. 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 On repeat imaging, this lesion self-resolved though she continued to have diplopia. It has been postulated that a nerve injury could have resulted in atrophy of an ocular muscle (diagnosed with L eye abducens palsy) leading to her persistent symptoms. Her physicians have not found evidence of a relapsing demyelinating disorder such as MS, paraneoplastic syndrome or myasthenia gravis after extensive workup. Pt reports that she continues having difficulty with her double vision and that the prism lenses are intermittently helpful. She has discussed possible surgical correction but doesn't think she will proceed with surgery. She has no limitations with respect to her balance but reports some occasional staggering. She has to wait a few seconds if she bends over and then stands  up quickly and also struggles with steps. Denies any vertigo currently but does have a history of vertigo in the past. She went to Dr. Kathyrn Sheriff at [facility] and had VNG testing on 08/14/2018 which revealed 34% left [facility] per medical record. 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 She takes gabapentin and Cymbalta to help with the pain related to her neuropathy. Her last eye exam was in 2022 and  she has had an MRI this year which did not show any significant findings. She complains of worsening osteoarthritis in her hands over the last couple years. She walks on her treadmill and works in the yard for exercise. No falls in the last 6 months.    Limitations Walking    Diagnostic tests See history    Patient Stated Goals Improve balance    Currently in Pain? No/denies                  TREATMENT   Neuromuscular Re-education  NuStep L2-4 x 5 minutes for warm-up during history with therapist adjusting resistance (3 minutes unbilled);  All balance exercises perform in // bars without UE support   Tandem gait in // bars x 4 lengths; Airex tandem gait in // bars x 4 lengths; Airex tandem balance with horizontal and vertical head turns alternating forward LE x 30s each; Airex balance beam side stepping x 4 lengths; Airex balance beam side stepping with horizontal and vertical head turns x 4 lengths each; VOR x 1 horizontal during forward and retro ambulation 75' each x 3, pt denies any dizziness but has some occasional cross-over steps and occasionally has to stop to regain her balance; Forward and retro gait in hallway with horizontal ball tosses to therapist with head/eye follow x 75' each direction; Forward and retro gait in hallway with vertical toss to self with head/eye follow x 75' each; Forward gait in  Hallway with head turns on command to read cards on wall x 150';   Pt educated throughout session about proper posture and technique with exercises. Improved exercise technique, movement at target joints, use of target muscles after min to mod verbal, visual, tactile cues.    Pt demonstrates excellent motivation during session today. Progressed gaze stabilization exercise to forward and retro ambulation with pt denying dizziness but demonstrating imbalance with occasional cross-over steps. She continues to struggle with tandem gait on Airex balance beam as well as  head turns during side stepping on the Airex balance beam. Improving confidence with ball tosses during forward/retro ambulation. HEP progressed with patient today to include single leg balance. Pt encouraged to follow-up as scheduled. Pt will benefit from PT services to address deficits in balance and mobility in order to return to full function at home.                       PT Short Term Goals - 09/11/20 1116      PT SHORT TERM GOAL #1   Title Pt will be independent with HEP in order to improve strength and balance in order to decrease fall risk and improve function at home.    Time 4    Period Weeks    Status New    Target Date 10/09/20             PT Long Term Goals - 09/15/20 1026      PT LONG  TERM GOAL #1   Title Pt will improve single leg balance to at least 20s bilaterally in order to decrease fall risk and improve her ability to work in the yard safely.    Baseline 09/11/20: RLE: 6-7s, LLE: 8-9s    Time 8    Period Weeks    Status New    Target Date 11/06/20      PT LONG TERM GOAL #2   Title Pt will improve ABC by at least 13% in order to demonstrate clinically significant improvement in balance confidence.    Baseline 09/11/20: 59.375%    Time 8    Period Weeks    Status New    Target Date 11/06/20      PT LONG TERM GOAL #3   Title Pt will improve FGA to 30/30 in order to demonstrate clinically significant improvement in balance and decreased risk for falls    Baseline 09/11/20: To be performed at next visit; 09/15/20: 26/30    Time 8    Period Weeks    Status Revised    Target Date 11/06/20                 Plan - 09/22/20 1610    Clinical Impression Statement Pt demonstrates excellent motivation during session today. Progressed gaze stabilization exercise to forward and retro ambulation with pt denying dizziness but demonstrating imbalance with occasional cross-over steps. She continues to struggle with tandem gait on Airex balance beam  as well as head turns during side stepping on the Airex balance beam. Improving confidence with ball tosses during forward/retro ambulation. HEP progressed with patient today to include single leg balance. Pt encouraged to follow-up as scheduled. Pt will benefit from PT services to address deficits in balance and mobility in order to return to full function at home.    Personal Factors and Comorbidities Age;Comorbidity 3+    Comorbidities OA, vertigo, neuropathy, diplopia, anxiety    Examination-Activity Limitations Stairs;Transfers    Examination-Participation Restrictions Church;Community Activity;Driving    Stability/Clinical Decision Making Stable/Uncomplicated    Rehab Potential Good    PT Frequency 2x / week    PT Duration 8 weeks    PT Treatment/Interventions ADLs/Self Care Home Management;Aquatic Therapy;Biofeedback;Cryotherapy;Electrical Stimulation;Canalith Repostioning;Iontophoresis 4mg /ml Dexamethasone;Moist Heat;Traction;Ultrasound;DME Instruction;Gait training;Stair training;Functional mobility training;Therapeutic activities;Therapeutic exercise;Balance training;Neuromuscular re-education;Cognitive remediation;Patient/family education;Manual techniques;Passive range of motion;Dry needling;Vestibular;Visual/perceptual remediation/compensation;Spinal Manipulations    PT Next Visit Plan Review HEP, progress balance exercises    PT Home Exercise Plan Access Code: [email or web address]    Consulted and Agree with Plan of Care Patient           Patient will benefit from skilled therapeutic intervention in order to improve the following deficits and impairments:  Decreased balance  Visit Diagnosis: Unsteadiness on feet  Dizziness and giddiness     Problem List Patient Active Problem List   Diagnosis Date Noted  . 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  . Dry eye syndrome of both eyes 08/06/2020  . Binocular vision disorder with diplopia 08/06/2020  . Sixth (abducent) nerve palsy, left eye  03/26/2018  . Esotropia 03/26/2018  . Abscess of finger of right hand   . Abscess of left forearm   . Cellulitis 10/21/2017  . 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 Personal history of colonic polyps   . Benign neoplasm of cecum   . Benign neoplasm of transverse colon   . Benign neoplasm of ascending colon   . Malignant neoplasm of upper-outer quadrant of left breast in female, estrogen receptor positive ([place]) 01/10/2017  .  Neuropathy 10/31/2016  . Taking medication for chronic disease 10/31/2016  . Primary osteoarthritis of right hip 10/31/2016  . 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[place]) 10/19/2015  . Menopause 10/19/2015  . Hormone replacement therapy (HRT) 10/19/2015  . 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 Routine general medical examination at a health care facility 10/10/2014  . 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 Female stress incontinence 10/10/2014  . Colon polyp 10/10/2014  . Episodic paroxysmal anxiety disorder 10/10/2014   Phillips Grout PT, DPT, GCS  Lachrista Heslin 09/22/2020, 11:29 AM  [facility] [address]. [place], [place], [address] Phone: [phone]   Fax:  [phone]  Name: Joele Zeno MRN: [id number] Date of Birth: July 07, 1945

## 2020-09-23 NOTE — Patient Instructions (Signed)
TREATMENT   Neuromuscular Re-education NuStep L2-4 x 5 minutes for warm-up during history with therapist adjusting resistance (3 minutes unbilled);  All balance exercises perform in // bars without UE support  Tandem gait in // bars x 4 lengths; Airex tandem gait in // bars x 4 lengths; Airex tandem balance with horizontal and vertical head turns alternating forward LE x 30s each; Airex balance beam side stepping x 4 lengths; Airex balance beam side stepping with horizontal and vertical head turns x 4 lengths each; VOR x 1 horizontal during forward and retro ambulation 75' each x 3, pt denies any dizziness but has some occasional cross-over steps and occasionally has to stop to regain her balance; Forward and retro gait in hallway with horizontal ball tosses to therapist with head/eye follow x 75' each direction; Forward and retro gait in hallway with vertical toss to self with head/eye follow x 75' each; Forward gait in  Hallway with head turns on command to read cards on wall x 150';   Pt educated throughout session about proper posture and technique with exercises. Improved exercise technique, movement at target joints, use of target muscles after min to mod verbal, visual, tactile cues.   Pt demonstrates excellent motivation during session today. Progressed gaze stabilization exercise to forward and retro ambulation with pt denying dizziness but demonstrating imbalance with occasional cross-over steps. She continues to struggle with tandem gait on Airex balance beam as well as head turns during side stepping on the Airex balance beam. Improving confidence with ball tosses during forward/retro ambulation.HEP progressed with patient today to include single leg balance. Pt encouraged to follow-up as scheduled. Pt will benefit from PT services to address deficits in balance and mobility in order to return to full function at home.

## 2020-09-24 ENCOUNTER — Other Ambulatory Visit: Payer: Self-pay

## 2020-09-24 ENCOUNTER — Ambulatory Visit: Payer: Medicare Other

## 2020-09-24 DIAGNOSIS — R2681 Unsteadiness on feet: Secondary | ICD-10-CM

## 2020-09-24 DIAGNOSIS — R42 Dizziness and giddiness: Secondary | ICD-10-CM | POA: Diagnosis not present

## 2020-09-24 NOTE — Therapy (Signed)
[facility] [address] [place], [place], [address] Phone: [phone]   Fax:  [phone]  Physical Therapy Treatment  Patient Details  Name: Melissa Daniels MRN: [id number] Date of Birth: [birth year] Referring Provider (PT): Dr. Erin Sons   Encounter Date: 09/24/2020   PT End of Session - 09/24/20 1001    Visit Number 5    Number of Visits 17    Date for PT Re-Evaluation 11/06/20    Authorization Type eval: 09/11/20    PT Start Time 0900    PT Stop Time 0946    PT Time Calculation (min) 46 min    Equipment Utilized During Treatment Gait belt    Activity Tolerance Patient tolerated treatment well    Behavior During Therapy [facility] for tasks assessed/performed           Past Medical History:  Diagnosis Date  . Anxiety   . Arthritis    hands  . Double vision   . Hypercholesteremia   . ITP (idiopathic thrombocytopenic purpura)   . Malignant neoplasm of upper-outer quadrant of left breast in female, estrogen receptor positive ([place]) 01/10/2017  . Neuropathy    bilateral feet  . Osteoporosis   . Vertigo    several episodes per year    Past Surgical History:  Procedure Laterality Date  . BREAST BIOPSY Left 12/28/2016   stereo path pend  . BREAST LUMPECTOMY    . COLONOSCOPY WITH PROPOFOL N/A 09/08/2017   Procedure: COLONOSCOPY WITH PROPOFOL;  Surgeon: Lucilla Lame, MD;  Location: [place];  Service: Endoscopy;  Laterality: N/A;  . COLONOSCOPY WITH PROPOFOL N/A 09/03/2020   Procedure: COLONOSCOPY WITH PROPOFOL;  Surgeon: Lucilla Lame, MD;  Location: [place];  Service: Endoscopy;  Laterality: N/A;  priority 4  . POLYPECTOMY  09/08/2017   Procedure: POLYPECTOMY;  Surgeon: Lucilla Lame, MD;  Location: [place];  Service: Endoscopy;;  . SPLENECTOMY, TOTAL      There were no vitals filed for this visit.   Subjective Assessment - 09/24/20 1002    Subjective Pt reports that she is doing well  today. No changes since last therapy session. No falls and no pain upon upon arrival. No specific questions or concerns at this time.    Pertinent History Pt complains of difficulty with balance and she would like to see if physical therapy would help. She started having imbalance/dizziness/double vision in July 2019. Per medical record at onset of symptoms she had abnormal brain MRI with a left occipital lobe periventricular lesion that had associated contrast enhancement. 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 On repeat imaging, this lesion self-resolved though she continued to have diplopia. It has been postulated that a nerve injury could have resulted in atrophy of an ocular muscle (diagnosed with L eye abducens palsy) leading to her persistent symptoms. Her physicians have not found evidence of a relapsing demyelinating disorder such as MS, paraneoplastic syndrome or myasthenia gravis after extensive workup. Pt reports that she continues having difficulty with her double vision and that the prism lenses are intermittently helpful. She has discussed possible surgical correction but doesn't think she will proceed with surgery. She has no limitations with respect to her balance but reports some occasional staggering. She has to wait a few seconds if she bends over and then stands up quickly and also struggles with steps. Denies any vertigo currently but does have a history of  vertigo in the past. She went to Dr. Kathyrn Sheriff at [facility] and had VNG testing on 08/14/2018 which revealed 34% left [facility] per medical record. 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 She takes gabapentin and Cymbalta to help with the pain related to her neuropathy. Her last eye exam was in 2022 and she has had an MRI this year which did not show any significant findings. She complains of worsening  osteoarthritis in her hands over the last couple years. She walks on her treadmill and works in the yard for exercise. No falls in the last 6 months.    Limitations Walking    Diagnostic tests See history    Patient Stated Goals Improve balance    Currently in Pain? No/denies            TREATMENT   Neuromuscular Re-education NuStep L2-6 x 5:30 minutes intervals with therapist adjusting resistance (3 minutes unbilled);   VOR x 1 horizontal during forward and retro ambulation 75' each x 3 each, pt denies any dizziness but has some occasional cross-over steps and occasionally has to stop to regain her balance; Forward and retro gait in hallway with horizontal ball tosses to therapist with head/eye follow x 75' each direction; Forward and retro gait in hallway with vertical toss to self with head/eye follow x 75' each; Gait with 180 degree quick turns on command left and right x multiple bouts; Single followed by double body rolls on wall with eyes open x 6 times each direction; Single body rolls on wall with eyes closed x 6 times each direction;  Blue mat 12" hurdle forward and lateral step overs x 6 lengths each; Tandem balance on blue mat x 30s; Tandem balance on blue mat with horizontal and vertical head turns alternating forward LE x 30s each;   Ther-ex  Sit to stand from regular height chair without UE support holding 6# dumbbell x 10, 8# med ball x 10;   Standing hip strengthening with 4# ankle weights: Hip flexion marches x 10 BLE; HS curls x 10 BLE; Hip abduction x 10 BLE; Hip extension x 10 BLE;   Pt educated throughout session about proper posture and technique with exercises. Improved exercise technique, movement at target joints, use of target muscles after min to mod verbal, visual, tactile cues.   Pt demonstrates excellent motivation during session today. Continued with VOR x 1 horizontal during forward and retro ambulation with pt denying dizziness but  demonstrating imbalance with occasional cross-over steps. Introduced body rolls on wall as well as quick 180 degree turns. Also initiated strengthening including weighted sit to stands and hip 4 way strengthening. Pt is making excellent progress toward her goals and was encouraged to follow-up as scheduled. She will benefit from PT services to address deficits in balance and mobility in order to return to full function at home.                             PT Short Term Goals - 09/11/20 1116      PT SHORT TERM GOAL #1   Title Pt will be independent with HEP in order to improve strength and balance in order to decrease fall risk and improve function at home.    Time 4    Period Weeks    Status New    Target Date 10/09/20  PT Long Term Goals - 09/15/20 1026      PT LONG TERM GOAL #1   Title Pt will improve single leg balance to at least 20s bilaterally in order to decrease fall risk and improve her ability to work in the yard safely.    Baseline 09/11/20: RLE: 6-7s, LLE: 8-9s    Time 8    Period Weeks    Status New    Target Date 11/06/20      PT LONG TERM GOAL #2   Title Pt will improve ABC by at least 13% in order to demonstrate clinically significant improvement in balance confidence.    Baseline 09/11/20: 59.375%    Time 8    Period Weeks    Status New    Target Date 11/06/20      PT LONG TERM GOAL #3   Title Pt will improve FGA to 30/30 in order to demonstrate clinically significant improvement in balance and decreased risk for falls    Baseline 09/11/20: To be performed at next visit; 09/15/20: 26/30    Time 8    Period Weeks    Status Revised    Target Date 11/06/20                 Plan - 09/24/20 1002    Clinical Impression Statement Pt demonstrates excellent motivation during session today. Continued with VOR x 1 horizontal during forward and retro ambulation with pt denying dizziness but demonstrating imbalance with occasional  cross-over steps. Introduced body rolls on wall as well as quick 180 degree turns. Also initiated strengthening including weighted sit to stands and hip 4 way strengthening. Pt is making excellent progress toward her goals and was encouraged to follow-up as scheduled. She will benefit from PT services to address deficits in balance and mobility in order to return to full function at home.    Personal Factors and Comorbidities Age;Comorbidity 3+    Comorbidities OA, vertigo, neuropathy, diplopia, anxiety    Examination-Activity Limitations Stairs;Transfers    Examination-Participation Restrictions Church;Community Activity;Driving    Stability/Clinical Decision Making Stable/Uncomplicated    Rehab Potential Good    PT Frequency 2x / week    PT Duration 8 weeks    PT Treatment/Interventions ADLs/Self Care Home Management;Aquatic Therapy;Biofeedback;Cryotherapy;Electrical Stimulation;Canalith Repostioning;Iontophoresis 4mg /ml Dexamethasone;Moist Heat;Traction;Ultrasound;DME Instruction;Gait training;Stair training;Functional mobility training;Therapeutic activities;Therapeutic exercise;Balance training;Neuromuscular re-education;Cognitive remediation;Patient/family education;Manual techniques;Passive range of motion;Dry needling;Vestibular;Visual/perceptual remediation/compensation;Spinal Manipulations    PT Next Visit Plan Review HEP, progress balance exercises    PT Home Exercise Plan Access Code: [email or web address]    Consulted and Agree with Plan of Care Patient           Patient will benefit from skilled therapeutic intervention in order to improve the following deficits and impairments:  Decreased balance  Visit Diagnosis: Unsteadiness on feet     Problem List Patient Active Problem List   Diagnosis Date Noted  . 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  . Dry eye syndrome of both eyes 08/06/2020  . Binocular vision disorder with diplopia 08/06/2020  . Sixth (abducent) nerve palsy, left eye  03/26/2018  . Esotropia 03/26/2018  . Abscess of finger of right hand   . Abscess of left forearm   . Cellulitis 10/21/2017  . 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 Personal history of colonic polyps   . Benign neoplasm of cecum   . Benign neoplasm of transverse colon   . Benign neoplasm of ascending colon   . Malignant neoplasm of upper-outer quadrant of left breast in female, estrogen receptor  positive ([place]) 01/10/2017  . Neuropathy 10/31/2016  . Taking medication for chronic disease 10/31/2016  . Primary osteoarthritis of right hip 10/31/2016  . 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[place]) 10/19/2015  . Menopause 10/19/2015  . Hormone replacement therapy (HRT) 10/19/2015  . 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 Routine general medical examination at a health care facility 10/10/2014  . 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 Female stress incontinence 10/10/2014  . Colon polyp 10/10/2014  . Episodic paroxysmal anxiety disorder 10/10/2014   Phillips Grout PT, DPT, GCS  Aubert Choyce 09/24/2020, 10:05 AM  [facility] [address] [place], [place], [address] Phone: [phone]   Fax:  [phone]  Name: Melissa Daniels MRN: [id number] Date of Birth: Apr 13, 1946

## 2020-09-29 ENCOUNTER — Ambulatory Visit: Payer: Medicare Other | Attending: Neurology

## 2020-09-29 ENCOUNTER — Other Ambulatory Visit: Payer: Self-pay

## 2020-09-29 DIAGNOSIS — H5000 Unspecified esotropia: Secondary | ICD-10-CM | POA: Diagnosis not present

## 2020-09-29 DIAGNOSIS — R2681 Unsteadiness on feet: Secondary | ICD-10-CM | POA: Diagnosis not present

## 2020-09-29 DIAGNOSIS — H532 Diplopia: Secondary | ICD-10-CM | POA: Diagnosis not present

## 2020-09-29 DIAGNOSIS — R42 Dizziness and giddiness: Secondary | ICD-10-CM | POA: Diagnosis not present

## 2020-09-29 DIAGNOSIS — H4922 Sixth [abducent] nerve palsy, left eye: Secondary | ICD-10-CM | POA: Diagnosis not present

## 2020-09-29 NOTE — Therapy (Signed)
[place] [facility] [address] [place], [place], [address] Phone: [phone]   Fax:  [phone]  Physical Therapy Treatment/Goal Update  Patient Details  Name: Melissa Daniels MRN: [id number] Date of Birth: 1945/09/28 Referring Provider (PT): Dr. Erin Sons   Encounter Date: 09/29/2020   PT End of Session - 09/29/20 0855    Visit Number 6    Number of Visits 17    Date for PT Re-Evaluation 11/06/20    Authorization Type eval: 09/11/20    PT Start Time 0848    PT Stop Time 0930    PT Time Calculation (min) 42 min    Equipment Utilized During Treatment Gait belt    Activity Tolerance Patient tolerated treatment well    Behavior During Therapy [facility] for tasks assessed/performed           Past Medical History:  Diagnosis Date  . Anxiety   . Arthritis    hands  . Double vision   . Hypercholesteremia   . ITP (idiopathic thrombocytopenic purpura)   . Malignant neoplasm of upper-outer quadrant of left breast in female, estrogen receptor positive ([place]) 01/10/2017  . Neuropathy    bilateral feet  . Osteoporosis   . Vertigo    several episodes per year    Past Surgical History:  Procedure Laterality Date  . BREAST BIOPSY Left 12/28/2016   stereo path pend  . BREAST LUMPECTOMY    . COLONOSCOPY WITH PROPOFOL N/A 09/08/2017   Procedure: COLONOSCOPY WITH PROPOFOL;  Surgeon: Lucilla Lame, MD;  Location: [place];  Service: Endoscopy;  Laterality: N/A;  . COLONOSCOPY WITH PROPOFOL N/A 09/03/2020   Procedure: COLONOSCOPY WITH PROPOFOL;  Surgeon: Lucilla Lame, MD;  Location: [place];  Service: Endoscopy;  Laterality: N/A;  priority 4  . POLYPECTOMY  09/08/2017   Procedure: POLYPECTOMY;  Surgeon: Lucilla Lame, MD;  Location: [place];  Service: Endoscopy;;  . SPLENECTOMY, TOTAL      There were no vitals filed for this visit.   Subjective Assessment - 09/29/20 0854    Subjective Pt reports that she is  doing well today. No changes since last therapy session. No falls and no pain upon upon arrival. No specific questions or concerns at this time.    Pertinent History Pt complains of difficulty with balance and she would like to see if physical therapy would help. She started having imbalance/dizziness/double vision in July 2019. Per medical record at onset of symptoms she had abnormal brain MRI with a left occipital lobe periventricular lesion that had associated contrast enhancement. 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 On repeat imaging, this lesion self-resolved though she continued to have diplopia. It has been postulated that a nerve injury could have resulted in atrophy of an ocular muscle (diagnosed with L eye abducens palsy) leading to her persistent symptoms. Her physicians have not found evidence of a relapsing demyelinating disorder such as MS, paraneoplastic syndrome or myasthenia gravis after extensive workup. Pt reports that she continues having difficulty with her double vision and that the prism lenses are intermittently helpful. She has discussed possible surgical correction but doesn't think she will proceed with surgery. She has no limitations with respect to her balance but reports some occasional staggering. She has to wait a few seconds if she bends over and then stands up quickly and also struggles with steps. Denies any vertigo currently but does have a history  of vertigo in the past. She went to Dr. Kathyrn Sheriff at [facility] and had VNG testing on 08/14/2018 which revealed 34% left [facility] per medical record. 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 She takes gabapentin and Cymbalta to help with the pain related to her neuropathy. Her last eye exam was in 2022 and she has had an MRI this year which did not show any significant findings. She complains  of worsening osteoarthritis in her hands over the last couple years. She walks on her treadmill and works in the yard for exercise. No falls in the last 6 months.    Limitations Walking    Diagnostic tests See history    Patient Stated Goals Improve balance    Currently in Pain? No/denies              TREATMENT   Neuromuscular Re-education Updated ABC with patient: 66.875% Step-ups to 6" step with Airex pad on top alternating LE x 10 on each side; 1/2 foam roller A/P balance x 30s; 1/2 foam roller A/P heel/toe rocking x 1 minute;  Single leg balance: R: 6.0s, 21s, 30.0s (19.0s average); L: 16.0s, 18.0s, 27.0s (20.3s average)   Ther-ex  NuStep L2-5x 5:30 minutes intervals with therapist adjusting resistance(3 minutesunbilled);   Standing hip strengthening with 5# ankle weights: Hip flexion marches x 1 minute BLE; HS curls x 1 minute BLE; Hip abduction x 1 minute BLE; Hip extension x 1 minute BLE;   Pt educated throughout session about proper posture and technique with exercises. Improved exercise technique, movement at target joints, use of target muscles after min to mod verbal, visual, tactile cues.   Pt demonstrates excellent motivation during session today. Updated goals with patient today. Her ABC has increased to 66.875% and her single leg balance has improved significantly bilaterally. Deferred FGA testing today as not enough time has passed since it was last performed. Continued with balance and strengthening today. Will continue to progress head turning dynamic balance exercises in future sessions. Pt has an appointment with the opthomologist this afternoon and will update therapist on the plan of care at next visit. Pt is making excellent progress toward her goals and was encouraged to follow-up as scheduled as well as continue her HEP. She will benefit from PT services to address deficits in balance and mobility in order to return to full function at  home.                         PT Short Term Goals - 09/29/20 1345      PT SHORT TERM GOAL #1   Title Pt will be independent with HEP in order to improve strength and balance in order to decrease fall risk and improve function at home.    Time 4    Period Weeks    Status Partially Met    Target Date 10/09/20             PT Long Term Goals - 09/29/20 0907      PT LONG TERM GOAL #1   Title Pt will improve single leg balance to at least 20s bilaterally in order to decrease fall risk and improve her ability to work in the yard safely.    Baseline 09/11/20: RLE: 6-7s, LLE: 8-9s; 09/29/20: (avg of three trials) R: 19.0s average;  L: 20.3s    Time 8  Period Weeks    Status Partially Met    Target Date 11/06/20      PT LONG TERM GOAL #2   Title Pt will improve ABC by at least 13% in order to demonstrate clinically significant improvement in balance confidence.    Baseline 09/11/20: 59.375%; 09/29/20: 66.875%    Time 8    Period Weeks    Status Partially Met    Target Date 11/06/20      PT LONG TERM GOAL #3   Title Pt will improve FGA to 30/30 in order to demonstrate clinically significant improvement in balance and decreased risk for falls    Baseline 09/11/20: To be performed at next visit; 09/15/20: 26/30    Time 8    Period Weeks    Status Deferred    Target Date 11/06/20                 Plan - 09/29/20 0859    Clinical Impression Statement Pt demonstrates excellent motivation during session today. Updated goals with patient today. Her ABC has increased to 66.875% and her single leg balance has improved significantly bilaterally. Deferred FGA testing today as not enough time has passed since it was last performed. Continued with balance and strengthening today. Will continue to progress head turning dynamic balance exercises in future sessions. Pt has an appointment with the opthomologist this afternoon and will update therapist on the plan of care at  next visit. Pt is making excellent progress toward her goals and was encouraged to follow-up as scheduled as well as continue her HEP. She will benefit from PT services to address deficits in balance and mobility in order to return to full function at home.    Personal Factors and Comorbidities Age;Comorbidity 3+    Comorbidities OA, vertigo, neuropathy, diplopia, anxiety    Examination-Activity Limitations Stairs;Transfers    Examination-Participation Restrictions Church;Community Activity;Driving    Stability/Clinical Decision Making Stable/Uncomplicated    Rehab Potential Good    PT Frequency 2x / week    PT Duration 8 weeks    PT Treatment/Interventions ADLs/Self Care Home Management;Aquatic Therapy;Biofeedback;Cryotherapy;Electrical Stimulation;Canalith Repostioning;Iontophoresis 56m/ml Dexamethasone;Moist Heat;Traction;Ultrasound;DME Instruction;Gait training;Stair training;Functional mobility training;Therapeutic activities;Therapeutic exercise;Balance training;Neuromuscular re-education;Cognitive remediation;Patient/family education;Manual techniques;Passive range of motion;Dry needling;Vestibular;Visual/perceptual remediation/compensation;Spinal Manipulations    PT Next Visit Plan Review HEP, progress balance exercises    PT Home Exercise Plan Access Code: [email or web address]    Consulted and Agree with Plan of Care Patient           Patient will benefit from skilled therapeutic intervention in order to improve the following deficits and impairments:  Decreased balance  Visit Diagnosis: Unsteadiness on feet  Dizziness and giddiness     Problem List Patient Active Problem List   Diagnosis Date Noted  . 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  . Dry eye syndrome of both eyes 08/06/2020  . Binocular vision disorder with diplopia 08/06/2020  . Sixth (abducent) nerve palsy, 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  . Esotropia 03/26/2018  . Abscess of finger of right hand   . Abscess of left forearm   .  Cellulitis 10/21/2017  . 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 Personal history of colonic polyps   . Benign neoplasm of cecum   . Benign neoplasm of transverse colon   . Benign neoplasm of ascending colon   . Malignant neoplasm of upper-outer quadrant of left breast in female, estrogen receptor positive [place] 01/10/2017  . Neuropathy 10/31/2016  . Taking medication for chronic disease 10/31/2016  . Primary osteoarthritis of right hip 10/31/2016  .  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[place]) 10/19/2015  . Menopause 10/19/2015  . Hormone replacement therapy (HRT) 10/19/2015  . 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 Routine general medical examination at a health care facility 10/10/2014  . 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 Female stress incontinence 10/10/2014  . Colon polyp 10/10/2014  . Episodic paroxysmal anxiety disorder 10/10/2014   Phillips Grout PT, DPT, GCS  Aidin Doane 09/29/2020, 1:47 PM  [place] [facility] [address] [place], [place], [address] Phone: [phone]   Fax:  [phone]  Name: Melissa Daniels MRN: [id number] Date of Birth: [birth year]

## 2020-09-30 NOTE — Patient Instructions (Incomplete)
TREATMENT   Neuromuscular Re-education Updated ABC with patient: 66.875% Step-ups to 6" step with Airex pad on top alternating LE x 10 on each side; 1/2 foam roller A/P balance x 30s; 1/2 foam roller A/P heel/toe rocking x 1 minute;  Single leg balance: R: 6.0s, 21s, 30.0s (19.0s average); L: 16.0s, 18.0s, 27.0s (20.3s average)   Ther-ex NuStep L2-5x 5:67minutes intervalswith therapist adjusting resistance(3 minutesunbilled);    Standing hip strengthening with5# ankle weights: Hip flexion marches x1 minute BLE; HS curls x1 minute BLE; Hip abduction x1 minute BLE; Hip extension x1 minute BLE;  Neuromuscular Re-education  VOR x 1 horizontalduring forward and retro ambulation 75' each x 3 each,pt denies any dizziness but has some occasional cross-over steps and occasionally has to stop to regain her balance; Forward and retro gait in hallway with horizontal ball tosses to therapist with head/eye follow x75' each direction; Forward and retro gait in hallway with vertical toss to self with head/eye follow x75' each; Gait with 180 degree quick turns on command left and right x multiple bouts; Single followed by double body rolls on wall with eyes open x 6 times each direction; Single body rolls on wall with eyes closed x 6 times each direction;  Blue mat 12" hurdle forward and lateral step overs x 6 lengths each; Tandem balance on blue mat x 30s; Tandem balance on blue mat with horizontal and vertical head turns alternating forward LE x 30s each;    Ther-ex  Sit to stand from regular height chair without UE support holding 6# dumbbell x 10, 8# med ball x 10;   Standing hip strengthening with 4# ankle weights: Hip flexion marches x 10 BLE; HS curls x 10 BLE; Hip abduction x 10 BLE; Hip extension x 10 BLE;    Pt educated throughout session about proper posture and technique with exercises. Improved exercise technique, movement at target joints,  use of target muscles after min to mod verbal, visual, tactile cues.    Pt demonstrates excellent motivation during session today. Continued with VOR x 1 horizontal duringforward and retro ambulation with pt denying dizziness but demonstrating imbalance with occasional cross-over steps. Introduced body rolls on wall as well as quick 180 degree turns. Also initiated strengthening including weighted sit to stands and hip 4 way strengthening. Pt is making excellent progress toward her goals and was encouraged to follow-up as scheduled. She will benefit from PT services to address deficits in balance and mobility in order to return to full function at home.

## 2020-10-01 ENCOUNTER — Other Ambulatory Visit: Payer: Self-pay

## 2020-10-01 ENCOUNTER — Ambulatory Visit: Payer: Medicare Other

## 2020-10-01 DIAGNOSIS — R42 Dizziness and giddiness: Secondary | ICD-10-CM

## 2020-10-01 DIAGNOSIS — R2681 Unsteadiness on feet: Secondary | ICD-10-CM | POA: Diagnosis not present

## 2020-10-01 NOTE — Therapy (Signed)
[place] [facility] [address] [place], [place], [address] Phone: [phone]   Fax:  [phone]  Physical Therapy Treatment  Patient Details  Name: Melissa Daniels MRN: [id number] Date of Birth: 22-Feb-1946 Referring Provider (PT): Dr. Erin Sons   Encounter Date: 10/01/2020   PT End of Session - 10/01/20 1007    Visit Number 7    Number of Visits 17    Date for PT Re-Evaluation 11/06/20    Authorization Type eval: 09/11/20    PT Start Time 0801    PT Stop Time 0845    PT Time Calculation (min) 44 min    Equipment Utilized During Treatment Gait belt    Activity Tolerance Patient tolerated treatment well    Behavior During Therapy [facility] for tasks assessed/performed           Past Medical History:  Diagnosis Date  . Anxiety   . Arthritis    hands  . Double vision   . Hypercholesteremia   . ITP (idiopathic thrombocytopenic purpura)   . Malignant neoplasm of upper-outer quadrant of left breast in female, estrogen receptor positive ([place]) 01/10/2017  . Neuropathy    bilateral feet  . Osteoporosis   . Vertigo    several episodes per year    Past Surgical History:  Procedure Laterality Date  . BREAST BIOPSY Left 12/28/2016   stereo path pend  . BREAST LUMPECTOMY    . COLONOSCOPY WITH PROPOFOL N/A 09/08/2017   Procedure: COLONOSCOPY WITH PROPOFOL;  Surgeon: Lucilla Lame, MD;  Location: [place];  Service: Endoscopy;  Laterality: N/A;  . COLONOSCOPY WITH PROPOFOL N/A 09/03/2020   Procedure: COLONOSCOPY WITH PROPOFOL;  Surgeon: Lucilla Lame, MD;  Location: [place];  Service: Endoscopy;  Laterality: N/A;  priority 4  . POLYPECTOMY  09/08/2017   Procedure: POLYPECTOMY;  Surgeon: Lucilla Lame, MD;  Location: [place];  Service: Endoscopy;;  . SPLENECTOMY, TOTAL      There were no vitals filed for this visit.   Subjective Assessment - 10/01/20 0802    Subjective Pt reports that she is doing well  today. She saw opthomology and she agreed to eye surgery November 27, 2020. No falls and no pain upon upon arrival. No specific questions or concerns at this time.    Pertinent History Pt complains of difficulty with balance and she would like to see if physical therapy would help. She started having imbalance/dizziness/double vision in July 2019. Per medical record at onset of symptoms she had abnormal brain MRI with a left occipital lobe periventricular lesion that had associated contrast enhancement. 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 On repeat imaging, this lesion self-resolved though she continued to have diplopia. It has been postulated that a nerve injury could have resulted in atrophy of an ocular muscle (diagnosed with L eye abducens palsy) leading to her persistent symptoms. Her physicians have not found evidence of a relapsing demyelinating disorder such as MS, paraneoplastic syndrome or myasthenia gravis after extensive workup. Pt reports that she continues having difficulty with her double vision and that the prism lenses are intermittently helpful. She has discussed possible surgical correction but doesn't think she will proceed with surgery. She has no limitations with respect to her balance but reports some occasional staggering. She has to wait a few seconds if she bends over and then stands up quickly and also struggles with steps. Denies any vertigo currently  but does have a history of vertigo in the past. She went to Dr. Kathyrn Sheriff at [facility] and had VNG testing on 08/14/2018 which revealed 34% left [facility] per medical record. 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 She takes gabapentin and Cymbalta to help with the pain related to her neuropathy. Her last eye exam was in 2022 and she has had an MRI this year which did not show any significant findings.  She complains of worsening osteoarthritis in her hands over the last couple years. She walks on her treadmill and works in the yard for exercise. No falls in the last 6 months.    Limitations Walking    Diagnostic tests See history    Patient Stated Goals Improve balance    Currently in Pain? No/denies              TREATMENT   Neuromuscular Re-education Dix-Hallpike and Roll Tests repeated which are negative bilaterally; Nose to knee to ceiling 2 x 10 bilaterally, mild dizziness reported; Pencil push-ups 2 x 30s; Horizontal saccades holding pens approximately 12" apart x 30s; Convergence/divergence with two pens x 30s; Forward gait in hallway with horizontal ball tosses to therapist with head/eye follow x75' each direction; Forward gait in hallway with vertical toss to self with head/eye follow 2 x75'; Forward gait in hallway with horizontal head turns on command to read letters on wall 2 x75' each direction; Double body rolls on wall with eyes open x 2 times each direction; Double body rolls on wall with eyes closed x 2 times each direction;   Pt educated throughout session about proper posture and technique with exercises. Improved exercise technique, movement at target joints, use of target muscles after min to mod verbal, visual, tactile cues.   Pt demonstrates excellent motivation during session today. Initiated pencil push-ups, horizontal saccades, and convergence/divergence exercises. Repeated gait in hallway with ball tosses as well as letter reading on the walls. Also continued with body rolls on wall. Will issue eye exercises at next session. Pt is making excellent progress toward her goals and was encouraged to follow-up as scheduled. She will benefit from PT services to address deficits in balance and mobility in order to return to full function at home.                          PT Short Term Goals - 09/29/20 1345      PT SHORT TERM  GOAL #1   Title Pt will be independent with HEP in order to improve strength and balance in order to decrease fall risk and improve function at home.    Time 4    Period Weeks    Status Partially Met    Target Date 10/09/20             PT Long Term Goals - 09/29/20 0907      PT LONG TERM GOAL #1   Title Pt will improve single leg balance to at least 20s bilaterally in order to decrease fall risk and improve her ability to work in the yard safely.    Baseline 09/11/20: RLE: 6-7s, LLE: 8-9s; 09/29/20: (avg of three trials) R: 19.0s average;  L: 20.3s    Time 8    Period Weeks    Status Partially Met    Target Date 11/06/20      PT LONG  TERM GOAL #2   Title Pt will improve ABC by at least 13% in order to demonstrate clinically significant improvement in balance confidence.    Baseline 09/11/20: 59.375%; 09/29/20: 66.875%    Time 8    Period Weeks    Status Partially Met    Target Date 11/06/20      PT LONG TERM GOAL #3   Title Pt will improve FGA to 30/30 in order to demonstrate clinically significant improvement in balance and decreased risk for falls    Baseline 09/11/20: To be performed at next visit; 09/15/20: 26/30    Time 8    Period Weeks    Status Deferred    Target Date 11/06/20                 Plan - 10/01/20 1007    Clinical Impression Statement Pt demonstrates excellent motivation during session today. Initiated pencil push-ups, horizontal saccades, and convergence/divergence exercises. Repeated gait in hallway with ball tosses as well as letter reading on the walls. Also continued with body rolls on wall. Will issue eye exercises at next session. Pt is making excellent progress toward her goals and was encouraged to follow-up as scheduled. She will benefit from PT services to address deficits in balance and mobility in order to return to full function at home.    Personal Factors and Comorbidities Age;Comorbidity 3+    Comorbidities OA, vertigo, neuropathy,  diplopia, anxiety    Examination-Activity Limitations Stairs;Transfers    Examination-Participation Restrictions Church;Community Activity;Driving    Stability/Clinical Decision Making Stable/Uncomplicated    Rehab Potential Good    PT Frequency 2x / week    PT Duration 8 weeks    PT Treatment/Interventions ADLs/Self Care Home Management;Aquatic Therapy;Biofeedback;Cryotherapy;Electrical Stimulation;Canalith Repostioning;Iontophoresis 69m/ml Dexamethasone;Moist Heat;Traction;Ultrasound;DME Instruction;Gait training;Stair training;Functional mobility training;Therapeutic activities;Therapeutic exercise;Balance training;Neuromuscular re-education;Cognitive remediation;Patient/family education;Manual techniques;Passive range of motion;Dry needling;Vestibular;Visual/perceptual remediation/compensation;Spinal Manipulations    PT Next Visit Plan Review HEP, progress balance exercises    PT Home Exercise Plan Access Code: [email or web address]    Consulted and Agree with Plan of Care Patient           Patient will benefit from skilled therapeutic intervention in order to improve the following deficits and impairments:  Decreased balance  Visit Diagnosis: Unsteadiness on feet  Dizziness and giddiness     Problem List Patient Active Problem List   Diagnosis Date Noted  . 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  . Dry eye syndrome of both eyes 08/06/2020  . Binocular vision disorder with diplopia 08/06/2020  . Sixth (abducent) nerve palsy, 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  . Esotropia 03/26/2018  . Abscess of finger of right hand   . Abscess of left forearm   . Cellulitis 10/21/2017  . 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 Personal history of colonic polyps   . Benign neoplasm of cecum   . Benign neoplasm of transverse colon   . Benign neoplasm of ascending colon   . Malignant neoplasm of upper-outer quadrant of left breast in female, estrogen receptor positive ([place] 01/10/2017  . Neuropathy 10/31/2016  . Taking medication for  chronic disease 10/31/2016  . Primary osteoarthritis of right hip 10/31/2016  . 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[place] 10/19/2015  . Menopause 10/19/2015  . Hormone replacement therapy (HRT) 10/19/2015  . 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 Routine general medical examination at a health care facility 10/10/2014  . [place]parinemia ([place] 10/10/2014  . Female stress incontinence 10/10/2014  . Colon polyp 10/10/2014  . Episodic paroxysmal anxiety disorder 10/10/2014   JLyndel Safe  Cortney Mckinney PT, DPT, GCS  Melissa Daniels 10/01/2020, 10:26 AM  [place] [facility] [address] [place], [place], [address] Phone: [phone]   Fax:  [phone]  Name: Melissa Daniels MRN: [id number] Date of Birth: 21-Aug-1945

## 2020-10-06 ENCOUNTER — Other Ambulatory Visit: Payer: Self-pay

## 2020-10-06 ENCOUNTER — Ambulatory Visit: Payer: Medicare Other

## 2020-10-06 DIAGNOSIS — R2681 Unsteadiness on feet: Secondary | ICD-10-CM

## 2020-10-06 DIAGNOSIS — R42 Dizziness and giddiness: Secondary | ICD-10-CM | POA: Diagnosis not present

## 2020-10-06 NOTE — Therapy (Signed)
[facility] [address] [place], [place], [address] Phone: [phone]   Fax:  [phone]  Physical Therapy Treatment  Patient Details  Name: Melissa Daniels MRN: [id number] Date of Birth: [birth year] Referring Provider (PT): Dr. Erin Sons   Encounter Date: 10/06/2020   PT End of Session - 10/06/20 0939    Visit Number 8    Number of Visits 17    Date for PT Re-Evaluation 11/06/20    Authorization Type eval: 09/11/20    PT Start Time 0932    PT Stop Time 1015    PT Time Calculation (min) 43 min    Equipment Utilized During Treatment Gait belt    Activity Tolerance Patient tolerated treatment well    Behavior During Therapy [facility] for tasks assessed/performed           Past Medical History:  Diagnosis Date  . Anxiety   . Arthritis    hands  . Double vision   . Hypercholesteremia   . ITP (idiopathic thrombocytopenic purpura)   . Malignant neoplasm of upper-outer quadrant of left breast in female, estrogen receptor positive ([place]) 01/10/2017  . Neuropathy    bilateral feet  . Osteoporosis   . Vertigo    several episodes per year    Past Surgical History:  Procedure Laterality Date  . BREAST BIOPSY Left 12/28/2016   stereo path pend  . BREAST LUMPECTOMY    . COLONOSCOPY WITH PROPOFOL N/A 09/08/2017   Procedure: COLONOSCOPY WITH PROPOFOL;  Surgeon: Lucilla Lame, MD;  Location: [place];  Service: Endoscopy;  Laterality: N/A;  . COLONOSCOPY WITH PROPOFOL N/A 09/03/2020   Procedure: COLONOSCOPY WITH PROPOFOL;  Surgeon: Lucilla Lame, MD;  Location: [place];  Service: Endoscopy;  Laterality: N/A;  priority 4  . POLYPECTOMY  09/08/2017   Procedure: POLYPECTOMY;  Surgeon: Lucilla Lame, MD;  Location: [place];  Service: Endoscopy;;  . SPLENECTOMY, TOTAL      There were no vitals filed for this visit.   Subjective Assessment - 10/06/20 0936    Subjective Pt reports that she is doing well  today. No falls and no pain upon upon arrival. No specific questions or concerns at this time.    Pertinent History Pt complains of difficulty with balance and she would like to see if physical therapy would help. She started having imbalance/dizziness/double vision in July 2019. Per medical record at onset of symptoms she had abnormal brain MRI with a left occipital lobe periventricular lesion that had associated contrast enhancement. 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 On repeat imaging, this lesion self-resolved though she continued to have diplopia. It has been postulated that a nerve injury could have resulted in atrophy of an ocular muscle (diagnosed with L eye abducens palsy) leading to her persistent symptoms. Her physicians have not found evidence of a relapsing demyelinating disorder such as MS, paraneoplastic syndrome or myasthenia gravis after extensive workup. Pt reports that she continues having difficulty with her double vision and that the prism lenses are intermittently helpful. She has discussed possible surgical correction but doesn't think she will proceed with surgery. She has no limitations with respect to her balance but reports some occasional staggering. She has to wait a few seconds if she bends over and then stands up quickly and also struggles with steps. Denies any vertigo currently but does have a history of vertigo in the past. She went  to Dr. Kathyrn Sheriff at [facility] and had VNG testing on 08/14/2018 which revealed 34% left [facility] per medical record. 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 She takes gabapentin and Cymbalta to help with the pain related to her neuropathy. Her last eye exam was in 2022 and she has had an MRI this year which did not show any significant findings. She complains of worsening osteoarthritis in her hands over the  last couple years. She walks on her treadmill and works in the yard for exercise. No falls in the last 6 months.    Limitations Walking    Diagnostic tests See history    Patient Stated Goals Improve balance    Currently in Pain? No/denies               TREATMENT   Neuromuscular Re-education NuStep L2-4 for warm-up during history x 5 minutes; Horizontal saccades with post-it notes on wall with "x" for target approximately 12" apart, 3s hold on each target, 2 x 60s; Pencil push-ups 2 x 60s; Nose to knee to ceiling 2 x 10 bilaterally, mild dizziness reported; Forward/retro gait in hallway with horizontal ball tosses to therapist with head/eye follow x75' each direction; Forward/retro gait in hallway with vertical toss to self with head/eye follow 2 x75'; Double body rolls on wall with eyes open x 3 times each direction; Double body rolls on wall with eyes closed x 3 times each direction; Airex tandem balance without UE support alternating forward LE 2 x 30s each; Airex tandem gait without UE support x 6 lengths; Airex tandem balance without UE support alternating forward LE with horizontal and vertical head turns x 30s each with each foot forward;   Pt educated throughout session about proper posture and technique with exercises. Improved exercise technique, movement at target joints, use of target muscles after min to mod verbal, visual, tactile cues.   Pt demonstrates excellent motivation during session today. Continued with pencil push-ups and horizontal saccades and issued for HEP. Repeated gait in hallway with ball tosses as well as body rolls on wall. Will update goals at next visit and consider possible discharge to continue HEP. Pt is making excellent progress toward her goals and was encouraged to follow-up as scheduled. She will benefit from PT services to address deficits in balance and mobility in order to return to full function at  home.                             PT Education - 10/07/20 0815    Education Details HEP modification    Person(s) Educated Patient    Methods Explanation    Comprehension Verbalized understanding            PT Short Term Goals - 09/29/20 1345      PT SHORT TERM GOAL #1   Title Pt will be independent with HEP in order to improve strength and balance in order to decrease fall risk and improve function at home.    Time 4    Period Weeks    Status Partially Met    Target Date 10/09/20             PT Long Term Goals - 09/29/20 0907      PT LONG TERM GOAL #1   Title Pt will improve single leg balance to at least 20s bilaterally  in order to decrease fall risk and improve her ability to work in the yard safely.    Baseline 09/11/20: RLE: 6-7s, LLE: 8-9s; 09/29/20: (avg of three trials) R: 19.0s average;  L: 20.3s    Time 8    Period Weeks    Status Partially Met    Target Date 11/06/20      PT LONG TERM GOAL #2   Title Pt will improve ABC by at least 13% in order to demonstrate clinically significant improvement in balance confidence.    Baseline 09/11/20: 59.375%; 09/29/20: 66.875%    Time 8    Period Weeks    Status Partially Met    Target Date 11/06/20      PT LONG TERM GOAL #3   Title Pt will improve FGA to 30/30 in order to demonstrate clinically significant improvement in balance and decreased risk for falls    Baseline 09/11/20: To be performed at next visit; 09/15/20: 26/30    Time 8    Period Weeks    Status Deferred    Target Date 11/06/20                 Plan - 10/06/20 1006    Clinical Impression Statement Pt demonstrates excellent motivation during session today. Continued with pencil push-ups and horizontal saccades and issued for HEP. Repeated gait in hallway with ball tosses as well as body rolls on wall. Will update goals at next visit and consider possible discharge to continue HEP. Pt is making excellent progress toward her  goals and was encouraged to follow-up as scheduled. She will benefit from PT services to address deficits in balance and mobility in order to return to full function at home.    Personal Factors and Comorbidities Age;Comorbidity 3+    Comorbidities OA, vertigo, neuropathy, diplopia, anxiety    Examination-Activity Limitations Stairs;Transfers    Examination-Participation Restrictions Church;Community Activity;Driving    Stability/Clinical Decision Making Stable/Uncomplicated    Rehab Potential Good    PT Frequency 2x / week    PT Duration 8 weeks    PT Treatment/Interventions ADLs/Self Care Home Management;Aquatic Therapy;Biofeedback;Cryotherapy;Electrical Stimulation;Canalith Repostioning;Iontophoresis 69m/ml Dexamethasone;Moist Heat;Traction;Ultrasound;DME Instruction;Gait training;Stair training;Functional mobility training;Therapeutic activities;Therapeutic exercise;Balance training;Neuromuscular re-education;Cognitive remediation;Patient/family education;Manual techniques;Passive range of motion;Dry needling;Vestibular;Visual/perceptual remediation/compensation;Spinal Manipulations    PT Next Visit Plan Update outcome measures/goals, review HEP, progress balance exercises    PT Home Exercise Plan Access Code: [email or web address]    Consulted and Agree with Plan of Care Patient           Patient will benefit from skilled therapeutic intervention in order to improve the following deficits and impairments:  Decreased balance  Visit Diagnosis: Unsteadiness on feet     Problem List Patient Active Problem List   Diagnosis Date Noted  . 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  . Dry eye syndrome of both eyes 08/06/2020  . Binocular vision disorder with diplopia 08/06/2020  . Sixth (abducent) nerve palsy, 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  . Esotropia 03/26/2018  . Abscess of finger of right hand   . Abscess of left forearm   . Cellulitis 10/21/2017  . 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 Personal history of colonic polyps    . Benign neoplasm of cecum   . Benign neoplasm of transverse colon   . Benign neoplasm of ascending colon   . Malignant neoplasm of upper-outer quadrant of left breast in female, estrogen receptor positive ([place] 01/10/2017  . Neuropathy 10/31/2016  . Taking medication for chronic disease 10/31/2016  . Primary osteoarthritis of right hip  10/31/2016  . 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[place]) 10/19/2015  . Menopause 10/19/2015  . Hormone replacement therapy (HRT) 10/19/2015  . 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 Routine general medical examination at a health care facility 10/10/2014  . 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 Female stress incontinence 10/10/2014  . Colon polyp 10/10/2014  . Episodic paroxysmal anxiety disorder 10/10/2014   Phillips Grout PT, DPT, GCS  Melissa Daniels 10/07/2020, 8:21 AM  [facility] [address] [place], [place], [address] Phone: [phone]   Fax:  [phone]  Name: Melissa Daniels MRN: [id number] Date of Birth: [birth year]

## 2020-10-07 NOTE — Patient Instructions (Incomplete)
TREATMENT   Neuromuscular Re-education NuStep L2-4 for warm-up during history x 5 minutes; Horizontal saccades with post-it notes on wall with "x" for target approximately 12" apart, 3s hold on each target, 2 x 60s; Pencil push-ups 2 x 60s; Nose to knee to ceiling 2 x 10 bilaterally, mild dizziness reported; Forward/retro gait in hallway with horizontal ball tosses to therapist with head/eye follow x75' each direction; Forward/retro gait in hallway with vertical toss to self with head/eye follow 2 x75'; Double body rolls on wall with eyes open x 3 times each direction; Double body rolls on wall with eyes closed x 3 times each direction; Airex tandem balance without UE support alternating forward LE 2 x 30s each; Airex tandem gait without UE support x 6 lengths; Airex tandem balance without UE support alternating forward LE with horizontal and vertical head turns x 30s each with each foot forward;   Pt educated throughout session about proper posture and technique with exercises. Improved exercise technique, movement at target joints, use of target muscles after min to mod verbal, visual, tactile cues.   Pt demonstrates excellent motivation during session today. Continued with pencil push-ups and horizontal saccades and issued for HEP. Repeated gait in hallway with ball tosses as well as body rolls on wall. Will update goals at next visit and consider possible discharge to continue HEP. Pt is making excellent progress toward her goals and wasencouraged to follow-up as scheduled. Shewill benefit from PT services to address deficits in balance and mobility in order to return to full function at home.

## 2020-10-08 ENCOUNTER — Other Ambulatory Visit: Payer: Self-pay

## 2020-10-08 ENCOUNTER — Ambulatory Visit: Payer: Medicare Other

## 2020-10-08 DIAGNOSIS — R2681 Unsteadiness on feet: Secondary | ICD-10-CM

## 2020-10-08 DIAGNOSIS — R42 Dizziness and giddiness: Secondary | ICD-10-CM | POA: Diagnosis not present

## 2020-10-08 NOTE — Therapy (Signed)
[facility] [address] [place], [place], [address] Phone: [phone]   Fax:  [phone]  Physical Therapy Treatment/Discharge  Patient Details  Name: Melissa Daniels MRN: [id number] Date of Birth: 20-Apr-1946 Referring Provider (PT): Dr. Erin Sons   Encounter Date: 10/08/2020   PT End of Session - 10/08/20 0803    Visit Number 9    Number of Visits 17    Date for PT Re-Evaluation 11/06/20    Authorization Type eval: 09/11/20    PT Start Time 0757    PT Stop Time 0845    PT Time Calculation (min) 48 min    Equipment Utilized During Treatment Gait belt    Activity Tolerance Patient tolerated treatment well    Behavior During Therapy [facility] for tasks assessed/performed           Past Medical History:  Diagnosis Date  . Anxiety   . Arthritis    hands  . Double vision   . Hypercholesteremia   . ITP (idiopathic thrombocytopenic purpura)   . Malignant neoplasm of upper-outer quadrant of left breast in female, estrogen receptor positive ([place]) 01/10/2017  . Neuropathy    bilateral feet  . Osteoporosis   . Vertigo    several episodes per year    Past Surgical History:  Procedure Laterality Date  . BREAST BIOPSY Left 12/28/2016   stereo path pend  . BREAST LUMPECTOMY    . COLONOSCOPY WITH PROPOFOL N/A 09/08/2017   Procedure: COLONOSCOPY WITH PROPOFOL;  Surgeon: Lucilla Lame, MD;  Location: [place];  Service: Endoscopy;  Laterality: N/A;  . COLONOSCOPY WITH PROPOFOL N/A 09/03/2020   Procedure: COLONOSCOPY WITH PROPOFOL;  Surgeon: Lucilla Lame, MD;  Location: [place];  Service: Endoscopy;  Laterality: N/A;  priority 4  . POLYPECTOMY  09/08/2017   Procedure: POLYPECTOMY;  Surgeon: Lucilla Lame, MD;  Location: [place];  Service: Endoscopy;;  . SPLENECTOMY, TOTAL      There were no vitals filed for this visit.   Subjective Assessment - 10/08/20 0800    Subjective Pt reports that she is  doing well today. No falls and no pain upon upon arrival. No specific questions or concerns at this time.    Pertinent History Pt complains of difficulty with balance and she would like to see if physical therapy would help. She started having imbalance/dizziness/double vision in July 2019. Per medical record at onset of symptoms she had abnormal brain MRI with a left occipital lobe periventricular lesion that had associated contrast enhancement. 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 On repeat imaging, this lesion self-resolved though she continued to have diplopia. It has been postulated that a nerve injury could have resulted in atrophy of an ocular muscle (diagnosed with L eye abducens palsy) leading to her persistent symptoms. Her physicians have not found evidence of a relapsing demyelinating disorder such as MS, paraneoplastic syndrome or myasthenia gravis after extensive workup. Pt reports that she continues having difficulty with her double vision and that the prism lenses are intermittently helpful. She has discussed possible surgical correction but doesn't think she will proceed with surgery. She has no limitations with respect to her balance but reports some occasional staggering. She has to wait a few seconds if she bends over and then stands up quickly and also struggles with steps. Denies any vertigo currently but does have a history of vertigo in the past. She went  to Dr. Kathyrn Sheriff at [facility] and had VNG testing on 08/14/2018 which revealed 34% left [facility] per medical record. 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 She takes gabapentin and Cymbalta to help with the pain related to her neuropathy. Her last eye exam was in 2022 and she has had an MRI this year which did not show any significant findings. She complains of worsening osteoarthritis in her hands  over the last couple years. She walks on her treadmill and works in the yard for exercise. No falls in the last 6 months.    Limitations Walking    Diagnostic tests See history    Patient Stated Goals Improve balance    Currently in Pain? No/denies              [facility] PT Assessment - 10/08/20 0839      Functional Gait  Assessment   Gait assessed  Yes    Gait Level Surface Walks 20 ft in less than 5.5 sec, no assistive devices, good speed, no evidence for imbalance, normal gait pattern, deviates no more than 6 in outside of the 12 in walkway width.    Change in Gait Speed Able to smoothly change walking speed without loss of balance or gait deviation. Deviate no more than 6 in outside of the 12 in walkway width.    Gait with Horizontal Head Turns Performs head turns smoothly with slight change in gait velocity (eg, minor disruption to smooth gait path), deviates 6-10 in outside 12 in walkway width, or uses an assistive device.    Gait with Vertical Head Turns Performs head turns with no change in gait. Deviates no more than 6 in outside 12 in walkway width.    Gait and Pivot Turn Pivot turns safely within 3 sec and stops quickly with no loss of balance.    Step Over Obstacle Is able to step over 2 stacked shoe boxes taped together (9 in total height) without changing gait speed. No evidence of imbalance.    Gait with Narrow Base of Support Is able to ambulate for 10 steps heel to toe with no staggering.    Gait with Eyes Closed Walks 20 ft, uses assistive device, slower speed, mild gait deviations, deviates 6-10 in outside 12 in walkway width. Ambulates 20 ft in less than 9 sec but greater than 7 sec.    Ambulating Backwards Walks 20 ft, no assistive devices, good speed, no evidence for imbalance, normal gait    Steps Alternating feet, no rail.    Total Score 28             TREATMENT   Neuromuscular Re-education NuStep L1-2  for warm-up during history x 6 minutes (5 minutes  unbilled); Horizontal saccades with post-it notes on wall with "x" for target approximately 12" apart, 3s hold on each target, 2 x 60s; Pencil push-ups 2 x 60s; ABC: 68.125% FOTO: 51 (decreased from 60 at initial evaluation); Single leg balance: R: 33s, 26.8s, 13.3s (avg: 24.4s)  L: 18.7s, 14.5s, 19.6s (avg: 17.6s) FGA: 28/30 Brock string: 10' with 3 beads, 3 x 60s; Revewied HEP and discharge   Pt educated throughout session about proper posture and technique with exercises. Improved exercise technique, movement at target joints, use of target muscles after min to mod verbal, visual, tactile cues.   Pt demonstrates excellent motivation during session today. Updated outcome measures and goals today.  Her ABC improved to 68.125% and her single leg balance is close to 20 seconds on each side. Her FGA improved from 26/30 initially to 28/30 today. Continued with pencil push-ups and horizontal saccades and introduced Brock string exercises today. Reviewed HEP with patient and encouraged her to continue. At this point pt will be discharged to continue her exercises independently at home. If pt would like additional therapy after her eye surgery in July she was encouraged to call and reschedule and therapy office will obtain a new order from her physician.                         PT Education - 10/08/20 1233    Education Details Discharge    Person(s) Educated Patient    Methods Explanation    Comprehension Verbalized understanding            PT Short Term Goals - 10/08/20 0814      PT SHORT TERM GOAL #1   Title Pt will be independent with HEP in order to improve strength and balance in order to decrease fall risk and improve function at home.    Time 4    Period Weeks    Status Achieved    Target Date 10/09/20             PT Long Term Goals - 10/08/20 0815      PT LONG TERM GOAL #1   Title Pt will improve single leg balance to at least 20s bilaterally in  order to decrease fall risk and improve her ability to work in the yard safely.    Baseline 09/11/20: RLE: 6-7s, LLE: 8-9s; 09/29/20: (avg of three trials) R: 19.0s average;  L: 20.3s; 10/08/20: (avg of three trials) R: 24.4s L: 17.6s;    Time 8    Period Weeks    Status Partially Met      PT LONG TERM GOAL #2   Title Pt will improve ABC by at least 13% in order to demonstrate clinically significant improvement in balance confidence.    Baseline 09/11/20: 59.375%; 09/29/20: 66.875%; 10/08/20: 68.125%    Time 8    Period Weeks    Status Partially Met      PT LONG TERM GOAL #3   Title Pt will improve FGA to 30/30 in order to demonstrate clinically significant improvement in balance and decreased risk for falls    Baseline 09/11/20: To be performed at next visit; 09/15/20: 26/30; 10/08/20: 28/30    Time 8    Period Weeks    Status Partially Met                 Plan - 10/08/20 0804    Clinical Impression Statement Pt demonstrates excellent motivation during session today. Updated outcome measures and goals today. Her ABC improved to 68.125% and her single leg balance is close to 20 seconds on each side. Her FGA improved from 26/30 initially to 28/30 today. Continued with pencil push-ups and horizontal saccades and introduced Brock string exercises today. Reviewed HEP with patient and encouraged her to continue. At this point pt will be discharged to continue her exercises independently at home. If pt would like additional therapy after her eye surgery in July she was encouraged to call and reschedule and therapy office will obtain a new order from her physician.    Personal Factors and Comorbidities Age;Comorbidity 3+    Comorbidities OA, vertigo, neuropathy, diplopia, anxiety  Examination-Activity Limitations Stairs;Transfers    Examination-Participation Restrictions Church;Community Activity;Driving    Stability/Clinical Decision Making Stable/Uncomplicated    Rehab Potential Good    PT  Frequency 2x / week    PT Duration 8 weeks    PT Treatment/Interventions ADLs/Self Care Home Management;Aquatic Therapy;Biofeedback;Cryotherapy;Electrical Stimulation;Canalith Repostioning;Iontophoresis 2m/ml Dexamethasone;Moist Heat;Traction;Ultrasound;DME Instruction;Gait training;Stair training;Functional mobility training;Therapeutic activities;Therapeutic exercise;Balance training;Neuromuscular re-education;Cognitive remediation;Patient/family education;Manual techniques;Passive range of motion;Dry needling;Vestibular;Visual/perceptual remediation/compensation;Spinal Manipulations    PT Next Visit Plan Discharge    PT Home Exercise Plan Access Code: [email or web address]    Consulted and Agree with Plan of Care Patient           Patient will benefit from skilled therapeutic intervention in order to improve the following deficits and impairments:  Decreased balance  Visit Diagnosis: Unsteadiness on feet     Problem List Patient Active Problem List   Diagnosis Date Noted  . 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  . Dry eye syndrome of both eyes 08/06/2020  . Binocular vision disorder with diplopia 08/06/2020  . Sixth (abducent) nerve palsy, 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  . Esotropia 03/26/2018  . Abscess of finger of right hand   . Abscess of left forearm   . Cellulitis 10/21/2017  . 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 Personal history of colonic polyps   . Benign neoplasm of cecum   . Benign neoplasm of transverse colon   . Benign neoplasm of ascending colon   . Malignant neoplasm of upper-outer quadrant of left breast in [place], [place] 01/10[place]  . Neuropathy 10/31/2016  . Taking medication for chronic disease 10/31/2016  . Primary osteoarthritis of right hip 10/31/2016  . 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 10/19/2015  . Menopause 10/19/2015  . Hormone replacement therapy (HRT) 10/19/2015  . 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 Routine general  medical examination at a health care facility 10/10/2014  . Hyperheparinemia ([place] 10/10/2014  . Female stress incontinence 10/10/2014  . Colon polyp 10/10/2014  . Episodic paroxysmal anxiety disorder 10/10/2014   JPhillips GroutPT, DPT, GCS  Avigdor Dollar 10/08/2020, 12:41 PM  [place] [place] [address]Phone: [phone]  Fax:  [phone] Name: Melissa MelucciMRN: [id number] of Birth: 627-Aug-1947

## 2020-10-22 ENCOUNTER — Telehealth: Payer: Self-pay | Admitting: Family Medicine

## 2020-10-22 NOTE — Telephone Encounter (Signed)
Patient called to request a referral for a mammogram at [facility].  Please advise and confirm once referral has been sent.

## 2020-10-23 NOTE — Telephone Encounter (Signed)
Faxed Mammo order to [place].

## 2020-10-30 NOTE — Telephone Encounter (Signed)
Called [facility] Dr Hilma Favors Hematology/ oncology to get mammo scheduled. They will order the mammo and have asked for the pt to call the first of the week and make sure it was scheduled. I gave pt the number to call [phone]

## 2020-10-30 NOTE — Telephone Encounter (Signed)
[facility]

## 2020-10-30 NOTE — Telephone Encounter (Signed)
Pt is calling [facility] did not received mammogram order please refax

## 2020-11-05 ENCOUNTER — Telehealth: Payer: Self-pay

## 2020-11-05 NOTE — Telephone Encounter (Signed)
Called pt back and explained that she would need to proceed with appt with Dr. Rita/ oncology so she could order her mammo. It was ordered by Ronnald Ramp last year as a favor to pt until this years mammo was done. She will need to proceed with [facility] ordering her mammos due to her history

## 2020-11-05 NOTE — Telephone Encounter (Signed)
Pt stated that she no longer see Hilma Favors and they advised that pts PCP needs to order/schedule and she needs a bilateral diagnostic 3D imaging mammo / please advise asap

## 2020-11-06 ENCOUNTER — Ambulatory Visit: Payer: Medicare Other | Admitting: Family Medicine

## 2020-11-06 ENCOUNTER — Ambulatory Visit
Admission: EM | Admit: 2020-11-06 | Discharge: 2020-11-06 | Disposition: A | Discharge status: Home / Self Care | Payer: Medicare Other | Attending: Emergency Medicine | Admitting: Emergency Medicine

## 2020-11-06 ENCOUNTER — Other Ambulatory Visit: Payer: Self-pay

## 2020-11-06 ENCOUNTER — Other Ambulatory Visit: Payer: Self-pay | Admitting: Family Medicine

## 2020-11-06 DIAGNOSIS — U071 COVID-19: Secondary | ICD-10-CM

## 2020-11-06 DIAGNOSIS — J029 Acute pharyngitis, unspecified: Secondary | ICD-10-CM | POA: Diagnosis not present

## 2020-11-06 DIAGNOSIS — E7849 Other hyperlipidemia: Secondary | ICD-10-CM

## 2020-11-06 MED ORDER — NIRMATRELVIR/RITONAVIR (PAXLOVID)TABLET: 3.0000 | ORAL_TABLET | Freq: Two times a day (BID) | ORAL | 0 refills | Status: AC

## 2020-11-06 NOTE — ED Triage Notes (Signed)
Patient complains of sore throat, cough and runny nose. States that she did a home covid test yesterday and was positive for covid.

## 2020-11-06 NOTE — ED Provider Notes (Signed)
HPI  SUBJECTIVE:  Melissa Daniels is a 75 y.o. female who presents with sore throat, cough starting 2 days ago.  She had a positive home COVID test yesterday.  No fevers, bodies, headaches, nasal congestion, rhinorrhea, loss of sense of smell or taste, shortness of breath, nausea,, diarrhea, abdominal pain, wheezing, shortness of breath.  Drooling, trismus, neck stiffness, voice changes, difficulty breathing.  She states that she feels as if her throat is swollen shut in the morning but it gets better as the day progresses.  She took an antipyretic within 6 hours of evaluation.  She has been taking Alka-Seltzer cold plus without improvement in her symptoms.  Her sore throat is worse in the morning.  She had the second COVID-vaccine on 4/21.  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  She has a history of hypercholesterolemia.  No history of diabetes, hypertension, chronic kidney disease.  ZOX:WRUEA, Iven Finn, MD   Past Medical History:  Diagnosis Date   Anxiety    Arthritis    hands   Double vision    Hypercholesteremia    ITP (idiopathic thrombocytopenic purpura)    Malignant neoplasm of upper-outer quadrant of left breast in female, estrogen receptor positive ([place]) 01/10/2017   Neuropathy    bilateral feet   Osteoporosis    Vertigo    several episodes per year    Past Surgical History:  Procedure Laterality Date   BREAST BIOPSY Left 12/28/2016   stereo path pend   BREAST LUMPECTOMY     COLONOSCOPY WITH PROPOFOL N/A 09/08/2017   Procedure: COLONOSCOPY WITH PROPOFOL;  Surgeon: Lucilla Lame, MD;  Location: [place];  Service: Endoscopy;  Laterality: N/A;   COLONOSCOPY WITH PROPOFOL N/A 09/03/2020   Procedure: COLONOSCOPY WITH PROPOFOL;  Surgeon: Lucilla Lame, MD;  Location: [place];  Service: Endoscopy;  Laterality: N/A;  priority 4   POLYPECTOMY  09/08/2017   Procedure: POLYPECTOMY;  Surgeon: Lucilla Lame, MD;  Location: [place];   Service: Endoscopy;;   SPLENECTOMY, TOTAL      Family History  Problem Relation Age of Onset   Heart disease Mother    Colon cancer Mother    Hypertension Mother    Heart disease Father    Hypertension Father    Breast cancer Neg Hx     Social History   Tobacco Use   Smoking status: Never   Smokeless tobacco: Never   Tobacco comments:    smoking cessation materials not required  Vaping Use   Vaping Use: Never used  Substance Use Topics   Alcohol use: No    Alcohol/week: 0.0 standard drinks   Drug use: No    No current facility-administered medications for this encounter.  Current Outpatient Medications:    Calcium Citrate-Vitamin D (CALCIUM + D PO), Take 1 tablet by mouth 2 (two) times daily., Disp: , Rfl:    Coenzyme Q10-Fish Oil-Vit E (CO-Q 10 OMEGA-3 FISH OIL PO), Take by mouth daily., Disp: , Rfl:    DULoxetine (CYMBALTA) 30 MG capsule, Take 90 mg by mouth daily., Disp: , Rfl:    gabapentin (NEURONTIN) 300 MG capsule, Take 1 capsule (300 mg total) by mouth at bedtime. (Patient taking differently: Take 300 mg by mouth in the morning and at bedtime. [facility] doctor- morning and night), Disp: 90 capsule, Rfl: 1   Magnesium 400 MG CAPS, Take 1 capsule by mouth 2 (two) times daily. , Disp: , Rfl:    Multiple  Vitamins-Minerals (OCUVITE ADULT 50+ PO), Take by mouth., Disp: , Rfl:    nirmatrelvir/ritonavir EUA (PAXLOVID) TABS, Take 3 tablets by mouth 2 (two) times daily for 5 days. Patient GFR is 74. Take nirmatrelvir (150 mg) two tablets twice daily for 5 days and ritonavir (100 mg) one tablet twice daily for 5 days., Disp: 30 tablet, Rfl: 0   oxybutynin (DITROPAN-XL) 5 MG 24 hr tablet, TAKE (1) TABLET BY MOUTH DAILY AT BEDTIME, Disp: 90 tablet, Rfl: 1   vitamin E 400 UNIT capsule, Take 400 Units by mouth daily., Disp: , Rfl:   Allergies  Allergen Reactions   Aspirin    Macrobid [Nitrofurantoin]    Nsaids Other (See Comments)    History of ITP, 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 platelet  toxic agents History of ITP, 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    Sulfa Antibiotics Other (See Comments)    Platelets drop      ROS  As noted in HPI.   Physical Exam  BP 109/67 (BP Location: Left Arm)   Pulse 80   Temp 98.2 F (36.8 C) (Oral)   Resp 14   Ht 5\' 6"  (1.676 m)   Wt 66.2 kg   SpO2 100%   BMI 23.57 kg/m   Constitutional: Well developed, well nourished, no acute distress Eyes:  EOMI, conjunctiva normal bilaterally HENT: Normocephalic, atraumatic,mucus membranes moist.  Mild nasal congestion.  Erythematous oropharynx, tonsils normal without exudates, uvula midline.  No cobblestoning, postnasal drip. Neck: Positive anterior cervical lymphadenopathy bilaterally Respiratory: Normal inspiratory effort, lungs clear bilaterally Cardiovascular: Normal rate, regular rhythm no murmurs. GI: nondistended skin: No rash, skin intact Musculoskeletal: no deformities Neurologic: Alert & oriented x 3, no focal neuro deficits Psychiatric: Speech and behavior appropriate   ED Course   Medications - No data to display  No orders of the defined types were placed in this encounter.   No results found for this or any previous visit (from the past 24 hour(s)). No results found.  ED Clinical Impression  1. COVID-19 virus infection   2. Pharyngitis, unspecified etiology      ED Assessment/Plan  Patient reporting positive COVID test yesterday.  She is status postsplenectomy.  GFR is 74 as of June 03, 2020.  She is a candidate for Paxlovid based on immunocompromise and age.  Patient would like to go to local pharmacy to pick it up.  We will have her stop the simvastatin and Xanax while taking the Paxlovid.  Supportive treatment.  Tylenol, Benadryl/Maalox mixture.  Follow-up with PMD as needed.  To the ER if she gets worse.  Discussed labs,  MDM, treatment plan, and plan for follow-up with patient. Discussed sn/sx that should prompt return to the ED. patient agrees  with plan.   Meds ordered this encounter  Medications   nirmatrelvir/ritonavir EUA (PAXLOVID) TABS    Sig: Take 3 tablets by mouth 2 (two) times daily for 5 days. Patient GFR is 74. Take nirmatrelvir (150 mg) two tablets twice daily for 5 days and ritonavir (100 mg) one tablet twice daily for 5 days.    Dispense:  30 tablet    Refill:  0      *This clinic note was created using [employer]. Therefore, there may be occasional mistakes despite careful proofreading.  ?    Melynda Ripple, MD 11/06/20 2012

## 2020-11-06 NOTE — Discharge Instructions (Addendum)
I have prescribed you Paxlovid will help shorten the intensity and duration of the COVID infection.  Quarantine for 5 days.  Wear a mask around others at all times for 5 days thereafter.  Stop the simvastatin and Xanax while you are taking the Paxlovid it will be fine to stop these medications for several days.    1 gram of Tylenol 3-4 times a day as needed for pain.  Make sure you drink plenty of extra fluids.  Some people find salt water gargles and  Traditional Medicinal's "Throat Coat" tea helpful. Take 5 mL of liquid Benadryl and 5 mL of Maalox. Mix it together, and then hold it in your mouth for as long as you can and then swallow. You may do this 4 times a day.    Go to www.goodrx.com  or www.costplusdrugs.com to look up your medications. This will give you a list of where you can find your prescriptions at the most affordable prices. Or ask the pharmacist what the cash price is, or if they have any other discount programs available to help make your medication more affordable. This can be less expensive than what you would pay with insurance.

## 2020-11-27 DIAGNOSIS — H4922 Sixth [abducent] nerve palsy, left eye: Secondary | ICD-10-CM | POA: Diagnosis not present

## 2020-11-27 DIAGNOSIS — M1611 Unilateral primary osteoarthritis, right hip: Secondary | ICD-10-CM | POA: Diagnosis not present

## 2020-11-27 DIAGNOSIS — H50012 Monocular esotropia, left eye: Secondary | ICD-10-CM | POA: Diagnosis not present

## 2020-11-27 DIAGNOSIS — Z79899 Other long term (current) drug therapy: Secondary | ICD-10-CM | POA: Diagnosis not present

## 2020-11-27 DIAGNOSIS — H532 Diplopia: Secondary | ICD-10-CM | POA: Diagnosis not present

## 2020-11-27 DIAGNOSIS — M170 Bilateral primary osteoarthritis of knee: Secondary | ICD-10-CM | POA: Diagnosis not present

## 2020-11-27 DIAGNOSIS — Z853 Personal history of malignant neoplasm of breast: Secondary | ICD-10-CM | POA: Diagnosis not present

## 2020-11-27 DIAGNOSIS — Z9081 Acquired absence of spleen: Secondary | ICD-10-CM | POA: Diagnosis not present

## 2020-11-27 DIAGNOSIS — M19042 Primary osteoarthritis, left hand: Secondary | ICD-10-CM | POA: Diagnosis not present

## 2020-11-27 DIAGNOSIS — M19041 Primary osteoarthritis, right hand: Secondary | ICD-10-CM | POA: Diagnosis not present

## 2020-11-27 DIAGNOSIS — Z9012 Acquired absence of left breast and nipple: Secondary | ICD-10-CM | POA: Diagnosis not present

## 2020-11-27 DIAGNOSIS — H519 Unspecified disorder of binocular movement: Secondary | ICD-10-CM | POA: Diagnosis not present

## 2020-12-08 ENCOUNTER — Ambulatory Visit (INDEPENDENT_AMBULATORY_CARE_PROVIDER_SITE_OTHER): Payer: Medicare Other | Admitting: Family Medicine

## 2020-12-08 ENCOUNTER — Encounter: Payer: Self-pay | Admitting: Family Medicine

## 2020-12-08 ENCOUNTER — Other Ambulatory Visit: Payer: Self-pay

## 2020-12-08 VITALS — BP 120/70 | HR 80 | Ht 66.0 in | Wt 146.0 lb

## 2020-12-08 DIAGNOSIS — N393 Stress incontinence (female) (male): Secondary | ICD-10-CM

## 2020-12-08 DIAGNOSIS — E7849 Other hyperlipidemia: Secondary | ICD-10-CM | POA: Diagnosis not present

## 2020-12-08 DIAGNOSIS — D693 Immune thrombocytopenic purpura: Secondary | ICD-10-CM | POA: Diagnosis not present

## 2020-12-08 MED ORDER — OXYBUTYNIN CHLORIDE ER 5 MG PO TB24
ORAL_TABLET | ORAL | 1 refills | Status: DC
Start: 2020-12-08 — End: 2021-06-10

## 2020-12-08 MED ORDER — SIMVASTATIN 20 MG PO TABS: 20.0000 mg | ORAL_TABLET | Freq: Every day | ORAL | 1 refills | Status: DC

## 2020-12-08 NOTE — Progress Notes (Signed)
Date:  12/08/2020   Name:  Melissa Daniels   DOB:  [birth year]   MRN:  [id number]   Chief Complaint: Hyperlipidemia and overactive bladder  Patient is a 75 year old female who presents for a medication refill exam. The patient reports the following problems: thrombocytopenia/hyperlipidemia/ stress urinary incontinence. Health maintenance has been reviewed up to date.    Hyperlipidemia This is a chronic problem. The current episode started more than 1 year ago. The problem is controlled. Recent lipid tests were reviewed and are high. Pertinent negatives include no chest pain, myalgias or shortness of breath. Current antihyperlipidemic treatment includes statins. The current treatment provides moderate improvement of lipids. There are no compliance problems.  Risk factors for coronary artery disease include dyslipidemia.  Female GU Problem Primary symptoms comment: urinary incontinence. This is a chronic problem. The current episode started more than 1 year ago. The problem occurs intermittently. Progression since onset: controlled. Associated symptoms include frequency and urgency. Pertinent negatives include no abdominal pain, back pain, chills, constipation, diarrhea, dysuria, fever, headaches, hematuria, nausea, rash or sore throat. The treatment provided moderate relief.   Lab Results  Component Value Date   CREATININE 0.79 06/03/2020   BUN 13 06/03/2020   NA 143 06/03/2020   K 4.7 06/03/2020   CL 101 06/03/2020   CO2 27 06/03/2020   Lab Results  Component Value Date   CHOL 222 (H) 12/19/2019   HDL 53 12/19/2019   LDLCALC 142 (H) 12/19/2019   TRIG 151 (H) 12/19/2019   CHOLHDL 3.5 11/27/2018   No results found for: TSH No results found for: HGBA1C Lab Results  Component Value Date   WBC 5.1 08/13/2019   HGB 13.4 08/13/2019   HCT 40.2 08/13/2019   MCV 93 08/13/2019   PLT 279 08/13/2019   Lab Results  Component Value Date   ALT 20 12/19/2019   AST 28 12/19/2019   ALKPHOS 87  12/19/2019   BILITOT 0.5 12/19/2019     Review of Systems  Constitutional:  Negative for chills, fatigue, fever and unexpected weight change.  HENT:  Negative for drooling, ear discharge, ear pain, nosebleeds and sore throat.   Respiratory:  Negative for cough, shortness of breath and wheezing.   Cardiovascular:  Negative for chest pain, palpitations and leg swelling.  Gastrointestinal:  Negative for abdominal pain, anal bleeding, blood in stool, constipation, diarrhea and nausea.  Endocrine: Negative for polydipsia.  Genitourinary:  Positive for frequency and urgency. Negative for dysuria, hematuria and vaginal bleeding.  Musculoskeletal:  Negative for back pain, myalgias and neck pain.  Skin:  Negative for rash.       Bruise easily  Allergic/Immunologic: Negative for environmental allergies.  Neurological:  Negative for dizziness and headaches.  Hematological:  Does not bruise/bleed easily.  Psychiatric/Behavioral:  Negative for suicidal ideas. The patient is not nervous/anxious.    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   Personal history of colonic polyps    Benign neoplasm of cecum    Benign neoplasm of transverse colon    Benign neoplasm of ascending colon    Malignant neoplasm of upper-outer quadrant of left  breast in female, estrogen receptor positive ([place]) 01/10/2017   Neuropathy 10/31/2016   Taking medication for chronic disease 10/31/2016   Primary osteoarthritis of right hip 10/31/2016   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[place]) 10/19/2015   Menopause 10/19/2015   Hormone replacement therapy (HRT) 10/19/2015   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   Routine general medical examination at a health care facility 10/10/2014   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   Female stress incontinence 10/10/2014   Colon polyp 10/10/2014   Episodic paroxysmal anxiety disorder 10/10/2014    Allergies  Allergen Reactions   Aspirin    Macrobid [Nitrofurantoin]    Nsaids Other (See Comments)    History of ITP, 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, 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    Sulfa Antibiotics Other (See Comments)    Platelets drop     Past Surgical History:  Procedure Laterality Date   BREAST BIOPSY Left 12/28/2016   stereo path pend   BREAST LUMPECTOMY     COLONOSCOPY WITH PROPOFOL N/A 09/08/2017   Procedure: COLONOSCOPY WITH PROPOFOL;  Surgeon: Lucilla Lame, MD;  Location: [place];  Service: Endoscopy;  Laterality: N/A;   COLONOSCOPY WITH PROPOFOL N/A 09/03/2020   Procedure: COLONOSCOPY WITH PROPOFOL;  Surgeon: Lucilla Lame, MD;  Location: [place];  Service: Endoscopy;  Laterality: N/A;  priority 4   POLYPECTOMY  09/08/2017   Procedure: POLYPECTOMY;  Surgeon: Lucilla Lame, MD;  Location: [place];  Service: Endoscopy;;   SPLENECTOMY, TOTAL      Social History   Tobacco Use   Smoking status: Never   Smokeless tobacco: Never   Tobacco comments:    smoking cessation materials not required  Vaping Use   Vaping Use: Never used  Substance Use Topics   Alcohol use: No    Alcohol/week: 0.0 standard drinks   Drug use: No     Medication list has been reviewed and updated.  Current Meds  Medication Sig   Calcium Citrate-Vitamin D (CALCIUM + D PO) Take 1 tablet by mouth 2 (two) times daily.   DULoxetine (CYMBALTA) 30 MG capsule Take 90 mg by mouth daily.   gabapentin (NEURONTIN) 300 MG capsule Take 1 capsule (300 mg total) by mouth at bedtime. (Patient taking differently: Take 300 mg by mouth in the morning and at bedtime. [facility]  doctor- morning and night)   Magnesium 400 MG CAPS Take 1 capsule by mouth 2 (two) times daily.    Multiple Vitamins-Minerals (OCUVITE ADULT 50+ PO) Take by mouth.   oxybutynin (DITROPAN-XL) 5 MG 24 hr tablet TAKE (1) TABLET BY MOUTH DAILY AT BEDTIME   simvastatin (ZOCOR) 20 MG tablet Take 20 mg by mouth at bedtime.   vitamin E 400 UNIT capsule Take 400 Units by mouth daily.   [DISCONTINUED] Coenzyme Q10-Fish Oil-Vit E (CO-Q 10 OMEGA-3 FISH OIL PO) Take by mouth daily.    PHQ 2/9 Scores 12/08/2020 09/07/2020 06/03/2020 01/15/2020  PHQ - 2 Score 0 0 0 0  PHQ- 9 Score 0 - 0 0    GAD 7 : Generalized Anxiety Score 12/08/2020 06/03/2020 12/19/2019 11/28/2019  Nervous, Anxious, on Edge 0 1 0 0  Control/stop worrying 0 0 0 0  Worry too much - different things 0 0 0 0  Trouble relaxing 0 0 0 0  Restless 0 0 0 0  Easily annoyed or irritable 0 0 0 0  Afraid - awful might happen 0 0 0 0  Total GAD 7 Score  0 1 0 0  Anxiety Difficulty - - - -    BP Readings from Last 3 Encounters:  12/08/20 120/70  11/06/20 109/67  09/03/20 (!) 97/48    Physical Exam Vitals and nursing note reviewed.  Constitutional:      General: She is not in acute distress.    Appearance: She is not diaphoretic.  HENT:     Head: Normocephalic and atraumatic.     Right Ear: External ear normal.     Left Ear: External ear normal.     Nose: Nose normal.  Eyes:     General:        Right eye: No discharge.        Left eye: No discharge.     Conjunctiva/sclera: Conjunctivae normal.     Pupils: Pupils are equal, round, and reactive to light.  Neck:     Thyroid: No thyromegaly.     Vascular: No JVD.  Cardiovascular:     Rate and Rhythm: Normal rate and regular rhythm.     Heart sounds: Normal heart sounds. No murmur heard.   No friction rub. No gallop.  Pulmonary:     Effort: Pulmonary effort is normal.     Breath sounds: Normal breath sounds.  Abdominal:     General: Bowel sounds are normal.     Palpations: Abdomen is  soft. There is no mass.     Tenderness: There is no abdominal tenderness. There is no guarding.  Musculoskeletal:        General: Normal range of motion.     Cervical back: Normal range of motion and neck supple.  Lymphadenopathy:     Cervical: No cervical adenopathy.  Skin:    General: Skin is warm and dry.  Neurological:     Mental Status: She is alert.     Deep Tendon Reflexes: Reflexes are normal and symmetric.    Wt Readings from Last 3 Encounters:  12/08/20 146 lb (66.2 kg)  11/06/20 146 lb (66.2 kg)  09/03/20 142 lb (64.4 kg)    BP 120/70   Pulse 80   Ht 5\' 6"  (1.676 m)   Wt 146 lb (66.2 kg)   BMI 23.57 kg/m   Assessment and Plan:  1. Familial multiple lipoprotein-type hyperlipidemia Chronic.  Controlled.  Stable.  Continue simvastatin 20 mg once a day.  Will check lipid panel for current status of LDL - simvastatin (ZOCOR) 20 MG tablet; Take 1 tablet (20 mg total) by mouth at bedtime.  Dispense: 90 tablet; Refill: 1 - Lipid Panel With LDL/HDL Ratio  2. 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  Controlled.  Stable.  Continue oxybutynin XL 5 mg once a day - oxybutynin (DITROPAN-XL) 5 MG 24 hr tablet; TAKE (1) TABLET BY MOUTH DAILY AT BEDTIME  Dispense: 90 tablet; Refill: 1  3. 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HCC) Chronic.  Controlled.  Stable.  Patient needs a periodic check her CBC to monitor ITP. - CBC with Differential/Platelet   Patient will reach out to her gynecologist for recheck of breast exam and continuance of mammograms and annual basis.

## 2020-12-09 LAB — CBC WITH DIFFERENTIAL/PLATELET
Basophils Absolute: 0.1 10*3/uL (ref 0.0–0.2)
Basos: 2 %
EOS (ABSOLUTE): 0.2 10*3/uL (ref 0.0–0.4)
Eos: 3 %
Hematocrit: 40.4 % (ref 34.0–46.6)
Hemoglobin: 13.4 g/dL (ref 11.1–15.9)
Immature Grans (Abs): 0 10*3/uL (ref 0.0–0.1)
Immature Granulocytes: 0 %
Lymphocytes Absolute: 2.1 10*3/uL (ref 0.7–3.1)
Lymphs: 36 %
MCH: 30.7 pg (ref 26.6–33.0)
MCHC: 33.2 g/dL (ref 31.5–35.7)
MCV: 92 fL (ref 79–97)
Monocytes Absolute: 0.9 10*3/uL (ref 0.1–0.9)
Monocytes: 16 %
Neutrophils Absolute: 2.5 10*3/uL (ref 1.4–7.0)
Neutrophils: 43 %
Platelets: 270 10*3/uL (ref 150–450)
RBC: 4.37 x10E6/uL (ref 3.77–5.28)
RDW: 13.1 % (ref 11.7–15.4)
WBC: 5.9 10*3/uL (ref 3.4–10.8)

## 2020-12-09 LAB — LIPID PANEL WITH LDL/HDL RATIO
Cholesterol, Total: 243 mg/dL — ABNORMAL HIGH (ref 100–199)
HDL: 54 mg/dL (ref >39)
LDL Chol Calc (NIH): 150 mg/dL — ABNORMAL HIGH (ref 0–99)
LDL/HDL Ratio: 2.8 ratio (ref 0.0–3.2)
Triglycerides: 213 mg/dL — ABNORMAL HIGH (ref 0–149)
VLDL Cholesterol Cal: 39 mg/dL (ref 5–40)

## 2020-12-18 ENCOUNTER — Telehealth: Payer: Self-pay

## 2020-12-18 NOTE — Telephone Encounter (Unsigned)
Copied from [place] [id number]. Topic: General - Other >> Dec 18, 2020  1:23 PM Leward Quan A wrote: Reason for CRM: Patient request a call back to discuss a mammogram can be reached at Ph# [phone]

## 2020-12-18 NOTE — Telephone Encounter (Signed)
ERROR

## 2020-12-18 NOTE — Telephone Encounter (Signed)
Called and spoke with patient and informed of her needing to get her mammograms from Dr Winn Jock. Patient has a hx of breast cancer and needs to continue to see [facility] for this. It looks like they tried to call her and leave her messages on 10/30/20 and patient has not returned their calls.  Patient said she will try to call them "again" to schedule her mammo but if not she will speak with her daughter who is " in healthcare, and can help me get this scheduled." She said " I am not upset with you, I am just upset with Dr. Ronnald Ramp."  Thanked me, and said she will call their office at [facility] oncology.

## 2020-12-21 ENCOUNTER — Telehealth: Payer: Self-pay

## 2020-12-21 NOTE — Telephone Encounter (Unsigned)
Copied from [place] [id number]. Topic: General - Other >> Dec 21, 2020 11:08 AM Celene Kras wrote: Reason for CRM: Pt calling to speak with Melissa Daniels called stating that she will get mammogram scheduled and that she says "Thank You." She states that her daughter had to hang up abruptly was that she was getting news regarding her father in laws surgery. Please advise.

## 2021-01-18 DIAGNOSIS — Z9889 Other specified postprocedural states: Secondary | ICD-10-CM | POA: Insufficient documentation

## 2021-01-29 ENCOUNTER — Encounter: Payer: Self-pay | Admitting: Family Medicine

## 2021-01-29 ENCOUNTER — Ambulatory Visit
Admission: RE | Admit: 2021-01-29 | Discharge: 2021-01-29 | Disposition: A | Discharge status: Home / Self Care | Payer: Medicare Other | Source: Ambulatory Visit | Attending: Family Medicine | Admitting: Family Medicine

## 2021-01-29 ENCOUNTER — Other Ambulatory Visit: Payer: Self-pay

## 2021-01-29 ENCOUNTER — Other Ambulatory Visit: Payer: Self-pay | Admitting: Family Medicine

## 2021-01-29 ENCOUNTER — Ambulatory Visit (INDEPENDENT_AMBULATORY_CARE_PROVIDER_SITE_OTHER): Payer: Medicare Other | Admitting: Family Medicine

## 2021-01-29 ENCOUNTER — Ambulatory Visit
Admission: RE | Admit: 2021-01-29 | Discharge: 2021-01-29 | Disposition: A | Discharge status: Home / Self Care | Payer: Medicare Other | Attending: Family Medicine | Admitting: Family Medicine

## 2021-01-29 VITALS — BP 110/70 | HR 68 | Ht 66.0 in | Wt 148.0 lb

## 2021-01-29 DIAGNOSIS — N393 Stress incontinence (female) (male): Secondary | ICD-10-CM | POA: Diagnosis not present

## 2021-01-29 DIAGNOSIS — M25461 Effusion, right knee: Secondary | ICD-10-CM | POA: Diagnosis not present

## 2021-01-29 DIAGNOSIS — M1711 Unilateral primary osteoarthritis, right knee: Secondary | ICD-10-CM | POA: Diagnosis not present

## 2021-01-29 DIAGNOSIS — M172 Bilateral post-traumatic osteoarthritis of knee: Secondary | ICD-10-CM | POA: Insufficient documentation

## 2021-01-29 DIAGNOSIS — S8991XA Unspecified injury of right lower leg, initial encounter: Secondary | ICD-10-CM | POA: Diagnosis not present

## 2021-01-29 DIAGNOSIS — E7849 Other hyperlipidemia: Secondary | ICD-10-CM

## 2021-01-29 MED ORDER — PREDNISONE 10 MG PO TABS: 10.0000 mg | ORAL_TABLET | Freq: Every day | ORAL | 0 refills | Status: DC

## 2021-01-29 NOTE — Progress Notes (Signed)
Date:  01/29/2021   Name:  Melissa Daniels   DOB:  [birth year]   MRN:  [id number]   Chief Complaint: Knee Pain (S/p fall x 3 weeks ago- missed a step and fell down on knees- still having swelling and pain- taking Tylenol and using Aspercream) and Urinary Tract Infection (Pressure when urinating)  Knee Pain  The incident occurred more than 1 week ago. The incident occurred at home. The injury mechanism was a direct blow and a fall. The pain is present in the left knee and right knee. The quality of the pain is described as aching. The pain is at a severity of 7/10. The pain is moderate. The pain has been Intermittent (worse when trying to get down on knees) since onset. Pertinent negatives include no inability to bear weight, loss of motion, loss of sensation, muscle weakness, numbness or tingling. She reports no foreign bodies present. The symptoms are aggravated by movement. She has tried acetaminophen for the symptoms. The treatment provided mild relief.  Abdominal Pain This is a new problem. The current episode started in the past 7 days. The problem occurs intermittently. The problem has been waxing and waning. The pain is located in the suprapubic region. The quality of the pain is dull and aching. The abdominal pain does not radiate. Pertinent negatives include no constipation, diarrhea, dysuria, fever, frequency, headaches, hematuria, myalgias or nausea. Associated symptoms comments: No dysuria.   Lab Results  Component Value Date   CREATININE 0.79 06/03/2020   BUN 13 06/03/2020   NA 143 06/03/2020   K 4.7 06/03/2020   CL 101 06/03/2020   CO2 27 06/03/2020   Lab Results  Component Value Date   CHOL 243 (H) 12/08/2020   HDL 54 12/08/2020   LDLCALC 150 (H) 12/08/2020   TRIG 213 (H) 12/08/2020   CHOLHDL 3.5 11/27/2018   No results found for: TSH No results found for: HGBA1C Lab Results  Component Value Date   WBC 5.9 12/08/2020   HGB 13.4 12/08/2020   HCT 40.4 12/08/2020   MCV 92  12/08/2020   PLT 270 12/08/2020   Lab Results  Component Value Date   ALT 20 12/19/2019   AST 28 12/19/2019   ALKPHOS 87 12/19/2019   BILITOT 0.5 12/19/2019     Review of Systems  Constitutional:  Negative for chills and fever.  HENT:  Negative for drooling, ear discharge, ear pain and sore throat.   Respiratory:  Negative for cough, shortness of breath and wheezing.   Cardiovascular:  Negative for chest pain, palpitations and leg swelling.  Gastrointestinal:  Positive for abdominal pain. Negative for blood in stool, constipation, diarrhea and nausea.  Endocrine: Negative for polydipsia.  Genitourinary:  Negative for dysuria, frequency, hematuria and urgency.  Musculoskeletal:  Negative for back pain, myalgias and neck pain.  Skin:  Negative for rash.  Allergic/Immunologic: Negative for environmental allergies.  Neurological:  Negative for dizziness, tingling, numbness and headaches.  Hematological:  Does not bruise/bleed easily.  Psychiatric/Behavioral:  Negative for suicidal ideas. The patient is not nervous/anxious.    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   Personal  history of colonic polyps    Benign neoplasm of cecum    Benign neoplasm of transverse colon    Benign neoplasm of ascending colon    Malignant neoplasm of upper-outer quadrant of left breast in female, estrogen receptor positive ([place]) 01/10/2017   Neuropathy 10/31/2016   Taking medication for chronic disease 10/31/2016   Primary osteoarthritis of right hip 10/31/2016   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[place]) 10/19/2015   Menopause 10/19/2015   Hormone replacement therapy (HRT)  10/19/2015   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   Routine general medical examination at a health care facility 10/10/2014   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   Female stress incontinence 10/10/2014   Colon polyp 10/10/2014   Episodic paroxysmal anxiety disorder 10/10/2014    Allergies  Allergen Reactions   Aspirin    Macrobid [Nitrofurantoin]    Nsaids Other (See Comments)    History of ITP, 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, 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    Sulfa Antibiotics Other (See Comments)    Platelets drop     Past Surgical History:  Procedure Laterality Date   BREAST BIOPSY Left 12/28/2016   stereo path pend   BREAST LUMPECTOMY     COLONOSCOPY WITH PROPOFOL N/A 09/08/2017   Procedure: COLONOSCOPY WITH PROPOFOL;  Surgeon: Lucilla Lame, MD;  Location: [place];  Service: Endoscopy;  Laterality: N/A;   COLONOSCOPY WITH PROPOFOL N/A 09/03/2020   Procedure: COLONOSCOPY WITH PROPOFOL;  Surgeon: Lucilla Lame, MD;  Location: [place];  Service: Endoscopy;  Laterality: N/A;  priority 4   POLYPECTOMY  09/08/2017   Procedure: POLYPECTOMY;  Surgeon: Lucilla Lame, MD;  Location: [place];  Service: Endoscopy;;   SPLENECTOMY, TOTAL      Social History   Tobacco Use   Smoking status: Never   Smokeless tobacco: Never   Tobacco comments:    smoking cessation materials not required  Vaping Use   Vaping Use: Never used  Substance Use Topics   Alcohol use: No    Alcohol/week: 0.0 standard drinks   Drug use: No     Medication list has been reviewed and updated.  Current Meds  Medication Sig   Calcium Citrate-Vitamin D (CALCIUM + D PO) Take 1 tablet by mouth 2 (two) times daily.   DULoxetine (CYMBALTA) 30 MG capsule Take 90 mg by mouth daily.   gabapentin (NEURONTIN) 300 MG capsule Take 1 capsule (300 mg total) by mouth at bedtime. (Patient taking differently: Take 300 mg by mouth in  the morning and at bedtime. [facility] doctor- morning and night)   Magnesium 400 MG CAPS Take 1 capsule by mouth 2 (two) times daily.    Multiple Vitamins-Minerals (OCUVITE ADULT 50+ PO) Take by mouth.   oxybutynin (DITROPAN-XL) 5 MG 24 hr tablet TAKE (1) TABLET BY MOUTH DAILY AT BEDTIME   simvastatin (ZOCOR) 20 MG tablet Take 1 tablet (20 mg total) by mouth at bedtime.   vitamin E 400 UNIT capsule Take 400 Units by mouth daily.    PHQ 2/9 Scores 01/29/2021 12/08/2020 09/07/2020 06/03/2020  PHQ - 2 Score 0 0 0 0  PHQ- 9 Score 0 0 - 0    GAD 7 : Generalized Anxiety Score 01/29/2021 12/08/2020 06/03/2020 12/19/2019  Nervous, Anxious, on Edge 0 0 1 0  Control/stop worrying 0 0 0 0  Worry too much - different things 0 0 0 0  Trouble relaxing 0 0 0 0  Restless 0 0 0 0  Easily annoyed or irritable 0 0 0 0  Afraid - awful might happen 0 0 0 0  Total GAD 7 Score 0 0 1 0  Anxiety Difficulty - - - -    BP Readings from Last 3 Encounters:  01/29/21 110/70  12/08/20 120/70  11/06/20 109/67    Physical Exam Vitals and nursing note reviewed.  HENT:     Right Ear: Tympanic membrane, ear canal and external ear normal. There is no impacted cerumen.     Left Ear: Tympanic membrane, ear canal and external ear normal. There is no impacted cerumen.     Nose: Nose normal. No congestion or rhinorrhea.  Cardiovascular:     Rate and Rhythm: Normal rate.     Heart sounds: Normal heart sounds, S1 normal and S2 normal. No murmur heard. No systolic murmur is present.  No diastolic murmur is present.    No friction rub. No gallop. No S3 or S4 sounds.  Pulmonary:     Effort: Pulmonary effort is normal. No respiratory distress.     Breath sounds: No stridor. No wheezing or rhonchi.  Abdominal:     Tenderness: There is no abdominal tenderness. There is no right CVA tenderness or left CVA tenderness.  Musculoskeletal:     Right knee: No effusion or crepitus. Tenderness present over the medial joint line and lateral  joint line. No LCL laxity or MCL laxity. Normal alignment.     Left knee: No crepitus. Tenderness present over the medial joint line. No lateral joint line tenderness. No LCL laxity or MCL laxity.Normal alignment.    Wt Readings from Last 3 Encounters:  01/29/21 148 lb (67.1 kg)  12/08/20 146 lb (66.2 kg)  11/06/20 146 lb (66.2 kg)    BP 110/70   Pulse 68   Ht '5\' 6"'$  (1.676 m)   Wt 148 lb (67.1 kg)   BMI 23.89 kg/m   Assessment and Plan:  1. 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  Episodic.  Stable.  Patient was noting some suprapubic discomfort that was mild over the past couple days.  On exam there is mild tenderness to the suprapubic area with no CVA tenderness.  Urinalysis is unremarkable for infection.  This is likely secondary to may be some rectus muscle strain perhaps related to the fall.  2. Familial multiple lipoprotein-type hyperlipidemia Chronic.  Controlled.  Stable.  Last LDL was noted to be 150 mg/dL.  Patient is currently on simvastatin and is tolerating.  3. Post-traumatic osteoarthritis of both knees New onset.  Persistent.  Approximately 2 weeks ago she missed a step and fell straight down upon the knees.  Both knees have discomfort but the right is greater than the left and there is more tenderness over the medial and lateral joint lines of the right knee.  We will x-ray the right knee done rule out plateau fracture and since patient is unable to tolerate NSAIDs due to her ITP we will use prednisone taper beginning at 40 mg over a 12-day.  If pain persist after prednisone taper and x-ray is okay our next step is evaluation by sports medicine. - DG Knee 1-2 Views Right; Future - predniSONE (DELTASONE) 10 MG tablet; Take 1 tablet (10 mg total) by mouth daily with breakfast. Disreguard one/daily taper as follows 4,4,4,3,3,3,2,2,2,1,1,1  Dispense: 30 tablet; Refill: 0

## 2021-02-22 ENCOUNTER — Ambulatory Visit (INDEPENDENT_AMBULATORY_CARE_PROVIDER_SITE_OTHER): Payer: Medicare Other | Admitting: Family Medicine

## 2021-02-22 ENCOUNTER — Encounter: Payer: Self-pay | Admitting: Family Medicine

## 2021-02-22 ENCOUNTER — Other Ambulatory Visit: Payer: Self-pay

## 2021-02-22 VITALS — BP 94/64 | HR 83 | Ht 66.0 in | Wt 148.0 lb

## 2021-02-22 DIAGNOSIS — M1711 Unilateral primary osteoarthritis, right knee: Secondary | ICD-10-CM

## 2021-02-22 MED ORDER — TRIAMCINOLONE ACETONIDE 40 MG/ML IJ SUSP
40.0000 mg | Freq: Once | INTRAMUSCULAR | Status: AC
  Administered 2021-02-22: 40 mg

## 2021-02-22 NOTE — Assessment & Plan Note (Signed)
Patient with reported history of mild on and off knee pain secondary to osteoarthritis. She unfortunately experienced a fall roughly 5-6 weeks prior onto concrete striking both knees anteriorly. She was able to get up on her own, denies significant ecchymoses but did note abrasions to the skin. Since that time she has had primarily anterior knee pain, right greater than left. Her primary care provider Dr. Otilio Miu wrote a course of steroids for her and ordered x-rays. She did note improvement in her symptoms while on the course and the left knee continues to improve - her right knee has worsened following discontinuation of oral steroids.  Her examination reveals no abnormalities to inspection, no effusion, tenderness to the lateral patellar facet, lateral > medial joint lines, and no laxity with A/P and valgus / varus stressing. Her McMurray's testing is benign. Radiographs reveal minor degenerative changes primally focal to the patellofemoral articulation.   Given her comorbid medical history, treatments to date, and findings today, I did review possible next steps. She did elect to proceed with right knee intra-articular corticosteroid injection. I did provide home exercises for her to start later this week (for both knee) and she is to contact us at the 2 week mark or beyond for any suboptimal progress. At that time, we can consider formal PT, viscosupplementation, braces, among other options.

## 2021-02-22 NOTE — Progress Notes (Signed)
Primary Care / Sports Medicine Office Visit  Patient Information:  Patient ID: Melissa Daniels, female DOB: 25-Jul-1945 Age: 75 y.o. MRN: [id number]   Melissa Daniels is a pleasant 75 y.o. 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   Personal history of colonic polyps    Benign neoplasm of cecum    Benign neoplasm of transverse colon    Benign neoplasm of ascending colon    Malignant neoplasm of upper-outer quadrant of left breast in female, estrogen receptor positive ([place]) 01/10/2017   Neuropathy 10/31/2016   Taking medication for chronic disease 10/31/2016   Primary osteoarthritis of right hip 10/31/2016   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[place]) 10/19/2015   Menopause 10/19/2015   Hormone replacement therapy (HRT) 10/19/2015   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   Routine general medical examination at a health care facility 10/10/2014   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   Female stress incontinence 10/10/2014   Colon polyp 10/10/2014   Episodic paroxysmal anxiety disorder 10/10/2014   Past Medical History:  Diagnosis Date   Anxiety    Arthritis    hands    Double vision    Hypercholesteremia    ITP (idiopathic thrombocytopenic purpura)    Malignant neoplasm of upper-outer quadrant of left breast in female, estrogen receptor positive ([place]) 01/10/2017   Neuropathy    bilateral feet   Osteoporosis    Vertigo    several episodes per year   Outpatient Encounter Medications as of 02/22/2021  Medication Sig   Calcium Citrate-Vitamin D (CALCIUM + D PO) Take 1 tablet by mouth 2 (two) times daily.   DULoxetine (CYMBALTA) 30 MG capsule Take 90 mg by mouth in the morning and at bedtime.   gabapentin (NEURONTIN) 300 MG capsule Take 1 capsule (300 mg total) by mouth at bedtime. (Patient taking differently: Take 300 mg by mouth in the morning and at bedtime. [facility] doctor- morning and night)   Magnesium 400 MG CAPS Take 1 capsule by mouth 2 (two) times daily.    Multiple Vitamins-Minerals (OCUVITE ADULT 50+ PO) Take 1 tablet by mouth in the morning and at bedtime.   oxybutynin (DITROPAN-XL) 5 MG 24 hr tablet TAKE (1) TABLET BY MOUTH DAILY AT BEDTIME   simvastatin (ZOCOR) 20 MG tablet Take 1 tablet (20 mg total) by mouth at bedtime.   vitamin E 400 UNIT capsule Take 400 Units by mouth daily.   [DISCONTINUED] predniSONE (DELTASONE) 10 MG tablet Take 1 tablet (10 mg total) by mouth daily with breakfast. Disreguard one/daily taper as follows 4,4,4,3,3,3,2,2,2,1,1,1   [EXPIRED] triamcinolone acetonide (KENALOG-40) injection 40 mg    No facility-administered encounter medications on  file as of 02/22/2021.   Past Surgical History:  Procedure Laterality Date   BREAST BIOPSY Left 12/28/2016   stereo path pend   BREAST LUMPECTOMY     COLONOSCOPY WITH PROPOFOL N/A 09/08/2017   Procedure: COLONOSCOPY WITH PROPOFOL;  Surgeon: Lucilla Lame, MD;  Location: [place];  Service: Endoscopy;  Laterality: N/A;   COLONOSCOPY WITH PROPOFOL N/A 09/03/2020   Procedure: COLONOSCOPY WITH PROPOFOL;  Surgeon: Lucilla Lame, MD;  Location: [place];  Service:  Endoscopy;  Laterality: N/A;  priority 4   POLYPECTOMY  09/08/2017   Procedure: POLYPECTOMY;  Surgeon: Lucilla Lame, MD;  Location: [place];  Service: Endoscopy;;   SPLENECTOMY, TOTAL      Vitals:   02/22/21 1101  BP: 94/64  Pulse: 83  SpO2: 97%   Vitals:   02/22/21 1101  Weight: 148 lb (67.1 kg)  Height: 5\' 6"  (1.676 m)   Body mass index is 23.89 kg/m.  DG Knee Complete 4 Views Right  Result Date: 02/01/2021 CLINICAL DATA:  trauma /fall right knee EXAM: RIGHT KNEE - COMPLETE 4+ VIEW COMPARISON:  None. FINDINGS: There is no visible acute fracture. There is a small to moderate-sized joint effusion. There is mild medial and patellofemoral predominant degenerative change with joint space narrowing. IMPRESSION: Mild medial and patellofemoral predominant degenerative arthritis of the right knee. Small to moderate-sized joint effusion.  No visible fracture. Electronically Signed   By: Maurine Simmering M.D.   On: 02/01/2021 13:14     Independent interpretation of notes and tests performed by another provider:   Independent interpretation of right knee x-rays dated 02/01/2021 reveal subtle superior patellar osteophyte, trace medial tibiofemoral narrowing, no acute osseous processes.  Procedures performed:   Procedure:  Injection of right knee anteromedial approach Consent obtained and verified. Skin prepped in a sterile fashion. Ethyl chloride spray for topical local analgesia.  Completed without difficulty and tolerated well. Medication: triamcinolone acetonide 40 mg/mL suspension for injection 1 mL total and 2 mL lidocaine 1% without epinephrine utilized for needle placement anesthetic Advised to contact for fevers/chills, erythema, induration, drainage, or persistent bleeding.   Pertinent History, Exam, Impression, and Recommendations:   Primary osteoarthritis of right knee Patient with reported history of mild on and off knee pain secondary to osteoarthritis. She  unfortunately experienced a fall roughly 5-6 weeks prior onto concrete striking both knees anteriorly. She was able to get up on her own, denies significant ecchymoses but did note abrasions to the skin. Since that time she has had primarily anterior knee pain, right greater than left. Her primary care provider Dr. Otilio Miu wrote a course of steroids for her and ordered x-rays. She did note improvement in her symptoms while on the course and the left knee continues to improve - her right knee has worsened following discontinuation of oral steroids.  Her examination reveals no abnormalities to inspection, no effusion, tenderness to the lateral patellar facet, lateral > medial joint lines, and no laxity with A/P and valgus / varus stressing. Her McMurray's testing is benign. Radiographs reveal minor degenerative changes primally focal to the patellofemoral articulation.   Given her comorbid medical history, treatments to date, and findings today, I did review possible next steps. She did elect to proceed with right knee intra-articular corticosteroid injection. I did provide home exercises for her to start later this week (for both knee) and she is to contact us at the 2 week mark or beyond for any suboptimal progress. At that time, we  can consider formal PT, viscosupplementation, braces, among other options.   Orders & Medications Meds ordered this encounter  Medications   triamcinolone acetonide (KENALOG-40) injection 40 mg   No orders of the defined types were placed in this encounter.    Return if symptoms worsen or fail to improve.     Montel Culver, MD   Primary Care Sports Medicine [place]

## 2021-02-22 NOTE — Patient Instructions (Signed)
You have just been given a cortisone injection to reduce pain and inflammation. After the injection you may notice immediate relief of pain as a result of the Lidocaine. It is important to rest the area of the injection for 24 to 48 hours after the injection. There is a possibility of some temporary increased discomfort and swelling for up to 72 hours until the cortisone begins to work. If you do have pain, simply rest the joint and use ice. If you can tolerate over the counter medications, you can try Tylenol, Aleve, or Advil for added relief per package instructions. - Relative rest x 2 days then gradually return to non-strenuous activity - Start home exercises later this week and continue x 4 weeks - Contact us at the 2 week mark or beyond for any lingering symptoms - Otherwise, follow-up as-needed

## 2021-03-12 DIAGNOSIS — Z780 Asymptomatic menopausal state: Secondary | ICD-10-CM | POA: Diagnosis not present

## 2021-03-12 DIAGNOSIS — Z9012 Acquired absence of left breast and nipple: Secondary | ICD-10-CM | POA: Diagnosis not present

## 2021-03-12 DIAGNOSIS — Z170 Estrogen receptor positive status [ER+]: Secondary | ICD-10-CM | POA: Diagnosis not present

## 2021-03-12 DIAGNOSIS — R922 Inconclusive mammogram: Secondary | ICD-10-CM | POA: Diagnosis not present

## 2021-03-12 DIAGNOSIS — Z6823 Body mass index (BMI) 23.0-23.9, adult: Secondary | ICD-10-CM | POA: Diagnosis not present

## 2021-03-12 DIAGNOSIS — Z853 Personal history of malignant neoplasm of breast: Secondary | ICD-10-CM | POA: Diagnosis not present

## 2021-03-12 DIAGNOSIS — C50412 Malignant neoplasm of upper-outer quadrant of left female breast: Secondary | ICD-10-CM | POA: Diagnosis not present

## 2021-03-12 DIAGNOSIS — Z08 Encounter for follow-up examination after completed treatment for malignant neoplasm: Secondary | ICD-10-CM | POA: Diagnosis not present

## 2021-03-12 LAB — HM MAMMOGRAPHY: HM Mammogram: NORMAL (ref 0–4)

## 2021-05-07 ENCOUNTER — Other Ambulatory Visit: Payer: Self-pay | Admitting: Family Medicine

## 2021-05-07 NOTE — Telephone Encounter (Signed)
Requested medication (s) are due for refill today: yes  Requested medication (s) are on the active medication list: yes  Last refill:  11/22/19  Future visit scheduled: yes  Notes to clinic:  Unable to refill per protocol, cannot delegate.      Requested Prescriptions  Pending Prescriptions Disp Refills   ALPRAZolam (XANAX) 0.25 MG tablet [Pharmacy Med Name: ALPRAZOLAM 0.25 MG TAB] 30 tablet     Sig: TAKE (1) TABLET BY MOUTH EVERY DAY AS NEEDED     Not Delegated - Psychiatry:  Anxiolytics/Hypnotics Failed - 05/07/2021  2:15 PM      Failed - This refill cannot be delegated      Failed - Urine Drug Screen completed in last 360 days      Passed - Valid encounter within last 6 months    Recent Outpatient Visits           2 months ago Primary osteoarthritis of right knee   [facility] Montel Culver, MD   3 months ago Post-traumatic osteoarthritis of both knees   [facility] Juline Patch, MD   5 months ago Familial multiple lipoprotein-type hyperlipidemia   [facility] Juline Patch, MD   11 months ago Familial multiple lipoprotein-type hyperlipidemia   [facility] Juline Patch, MD   1 year ago Foreign body (FB) in soft tissue   [facility] Juline Patch, MD       Future Appointments             In 1 month Juline Patch, MD [facility], [facility]

## 2021-06-10 ENCOUNTER — Other Ambulatory Visit: Payer: Self-pay

## 2021-06-10 ENCOUNTER — Encounter: Payer: Self-pay | Admitting: Family Medicine

## 2021-06-10 ENCOUNTER — Ambulatory Visit (INDEPENDENT_AMBULATORY_CARE_PROVIDER_SITE_OTHER): Payer: Medicare Other | Admitting: Family Medicine

## 2021-06-10 ENCOUNTER — Ambulatory Visit: Payer: Self-pay | Admitting: Family Medicine

## 2021-06-10 VITALS — BP 120/70 | HR 80 | Ht 66.0 in | Wt 147.0 lb

## 2021-06-10 DIAGNOSIS — F410 Panic disorder [episodic paroxysmal anxiety] without agoraphobia: Secondary | ICD-10-CM | POA: Diagnosis not present

## 2021-06-10 DIAGNOSIS — N393 Stress incontinence (female) (male): Secondary | ICD-10-CM

## 2021-06-10 DIAGNOSIS — E7849 Other hyperlipidemia: Secondary | ICD-10-CM

## 2021-06-10 DIAGNOSIS — R69 Illness, unspecified: Secondary | ICD-10-CM

## 2021-06-10 MED ORDER — ALPRAZOLAM 0.25 MG PO TABS: 0.2500 mg | ORAL_TABLET | ORAL | 0 refills | Status: DC | PRN

## 2021-06-10 MED ORDER — OXYBUTYNIN CHLORIDE ER 5 MG PO TB24: ORAL_TABLET | ORAL | 1 refills | Status: DC

## 2021-06-10 MED ORDER — SIMVASTATIN 20 MG PO TABS: 20.0000 mg | ORAL_TABLET | Freq: Every day | ORAL | 1 refills | Status: DC

## 2021-06-10 NOTE — Progress Notes (Addendum)
Date:  06/10/2021   Name:  Melissa Daniels   DOB:  [birth year]   MRN:  [id number]   Chief Complaint: Hyperlipidemia and overactive bladder  Hyperlipidemia This is a chronic problem. The current episode started more than 1 year ago. The problem is controlled. Recent lipid tests were reviewed and are variable. She has no history of chronic renal disease, diabetes, hypothyroidism, liver disease or obesity. There are no known factors aggravating her hyperlipidemia. Pertinent negatives include no chest pain, focal sensory loss, focal weakness, leg pain, myalgias or shortness of breath. The current treatment provides moderate improvement of lipids. There are no compliance problems.   Female GU Problem Primary symptoms comment: vaginal dryness/bladder overactivity. The current episode started more than 1 year ago. The problem occurs intermittently. The patient is experiencing no pain. Pertinent negatives include no abdominal pain, back pain, chills, constipation, diarrhea, dysuria, fever, frequency, headaches, hematuria, nausea, rash, sore throat or urgency. Treatments tried: ditropan. The treatment provided moderate relief.  Anxiety Presents for follow-up visit. Symptoms include nervous/anxious behavior. Patient reports no chest pain, dizziness, nausea, palpitations, shortness of breath or suicidal ideas. Primary symptoms comment: vaginal dryness/bladder overactivity.     Lab Results  Component Value Date   NA 143 06/03/2020   K 4.7 06/03/2020   CO2 27 06/03/2020   GLUCOSE 94 06/03/2020   BUN 13 06/03/2020   CREATININE 0.79 06/03/2020   CALCIUM 9.8 06/03/2020   GFRNONAA 74 06/03/2020   Lab Results  Component Value Date   CHOL 243 (H) 12/08/2020   HDL 54 12/08/2020   LDLCALC 150 (H) 12/08/2020   TRIG 213 (H) 12/08/2020   CHOLHDL 3.5 11/27/2018   No results found for: TSH No results found for: HGBA1C Lab Results  Component Value Date   WBC 5.9 12/08/2020   HGB 13.4 12/08/2020   HCT 40.4  12/08/2020   MCV 92 12/08/2020   PLT 270 12/08/2020   Lab Results  Component Value Date   ALT 20 12/19/2019   AST 28 12/19/2019   ALKPHOS 87 12/19/2019   BILITOT 0.5 12/19/2019   No results found for: 25OHVITD2, 25OHVITD3, VD25OH   Review of Systems  Constitutional:  Negative for chills and fever.  HENT:  Negative for drooling, ear discharge, ear pain and sore throat.   Respiratory:  Negative for cough, shortness of breath and wheezing.   Cardiovascular:  Negative for chest pain, palpitations and leg swelling.  Gastrointestinal:  Negative for abdominal pain, blood in stool, constipation, diarrhea and nausea.  Endocrine: Negative for polydipsia.  Genitourinary:  Negative for dysuria, frequency, hematuria and urgency.  Musculoskeletal:  Negative for back pain, myalgias and neck pain.  Skin:  Negative for rash.  Allergic/Immunologic: Negative for environmental allergies.  Neurological:  Negative for dizziness, focal weakness and headaches.  Hematological:  Does not bruise/bleed easily.  Psychiatric/Behavioral:  Negative for suicidal ideas. The patient is nervous/anxious.    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   Personal history of colonic polyps    Benign neoplasm of cecum  Benign neoplasm of transverse colon    Benign neoplasm of ascending colon    Malignant neoplasm of upper-outer quadrant of left breast in female, estrogen receptor positive ([place]) 01/10/2017   Neuropathy 10/31/2016   Taking medication for chronic disease 10/31/2016   Primary  osteoarthritis of right hip 10/31/2016   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[place]) 10/19/2015   Menopause 10/19/2015   Hormone replacement therapy (HRT) 10/19/2015   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   Routine general medical examination at a health care facility 10/10/2014   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   Female stress incontinence 10/10/2014   Colon polyp 10/10/2014   Episodic paroxysmal anxiety disorder 10/10/2014    Allergies  Allergen Reactions   Aspirin    Macrobid [Nitrofurantoin]    Nsaids Other (See Comments)    History of ITP, 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, 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    Sulfa Antibiotics Other (See Comments)    Platelets drop     Past Surgical History:  Procedure Laterality Date   BREAST BIOPSY Left 12/28/2016   stereo path pend   BREAST LUMPECTOMY     COLONOSCOPY WITH PROPOFOL N/A 09/08/2017   Procedure: COLONOSCOPY WITH PROPOFOL;  Surgeon: Lucilla Lame, MD;  Location: [place];  Service: Endoscopy;  Laterality: N/A;   COLONOSCOPY WITH PROPOFOL N/A 09/03/2020   Procedure: COLONOSCOPY WITH PROPOFOL;  Surgeon: Lucilla Lame, MD;  Location: [place];  Service: Endoscopy;  Laterality: N/A;  priority 4   POLYPECTOMY  09/08/2017   Procedure: POLYPECTOMY;  Surgeon: Lucilla Lame, MD;  Location: [place];  Service: Endoscopy;;   SPLENECTOMY, TOTAL      Social History   Tobacco Use   Smoking status: Never   Smokeless tobacco: Never  Vaping Use   Vaping Use: Never used  Substance Use Topics   Alcohol use: Not Currently   Drug use: Never     Medication list has been reviewed and updated.  Current Meds  Medication Sig   Calcium Citrate-Vitamin D (CALCIUM + D PO) Take 1 tablet by mouth 2 (two) times daily.   Cholecalciferol (VITAMIN D3) 25 MCG (1000 UT) CAPS Take 1 capsule by mouth daily.   DULoxetine (CYMBALTA) 30 MG capsule Take 90 mg by mouth in  the morning and at bedtime.   gabapentin (NEURONTIN) 300 MG capsule Take 1 capsule (300 mg total) by mouth at bedtime. (Patient taking differently: Take 300 mg by mouth in the morning and at bedtime. [facility] doctor- morning and night)   Magnesium 400 MG CAPS Take 1 capsule by mouth 2 (two) times daily.    Multiple Vitamins-Minerals (OCUVITE ADULT 50+ PO) Take 1 tablet by mouth in the morning and at bedtime.   oxybutynin (DITROPAN-XL) 5 MG 24 hr tablet TAKE (1) TABLET BY MOUTH DAILY AT BEDTIME   simvastatin (ZOCOR) 20 MG tablet Take 1 tablet (20 mg total) by mouth at bedtime.   vitamin E 400 UNIT capsule Take 400 Units by mouth daily.    PHQ 2/9 Scores 06/10/2021 02/22/2021 01/29/2021 12/08/2020  PHQ - 2 Score 0 0 0 0  PHQ- 9 Score 0 0 0 0    GAD 7 : Generalized Anxiety Score 06/10/2021 02/22/2021 01/29/2021 12/08/2020  Nervous, Anxious, on Edge 0 0 0 0  Control/stop worrying 0 0 0 0  Worry too much - different things 0 0 0 0  Trouble relaxing 0 0 0 0  Restless 0 0 0 0  Easily annoyed or  irritable 0 0 0 0  Afraid - awful might happen 0 0 0 0  Total GAD 7 Score 0 0 0 0  Anxiety Difficulty Not difficult at all Not difficult at all - -    BP Readings from Last 3 Encounters:  06/10/21 120/70  02/22/21 94/64  01/29/21 110/70    Physical Exam Vitals and nursing note reviewed.  Constitutional:      Appearance: She is well-developed and normal weight.  HENT:     Head: Normocephalic.     Right Ear: Tympanic membrane and external ear normal.     Left Ear: Tympanic membrane and external ear normal.     Nose: Nose normal.  Eyes:     General: Lids are everted, no foreign bodies appreciated. No scleral icterus.       Left eye: No foreign body or hordeolum.     Conjunctiva/sclera: Conjunctivae normal.     Right eye: Right conjunctiva is not injected.     Left eye: Left conjunctiva is not injected.     Pupils: Pupils are equal, round, and reactive to light.  Neck:     Thyroid: No thyromegaly.      Vascular: No JVD.     Trachea: No tracheal deviation.  Cardiovascular:     Rate and Rhythm: Normal rate and regular rhythm.     Heart sounds: Normal heart sounds, S1 normal and S2 normal. No murmur heard. No systolic murmur is present.  No diastolic murmur is present.    No friction rub. No gallop. No S3 or S4 sounds.  Pulmonary:     Effort: Pulmonary effort is normal. No respiratory distress.     Breath sounds: Normal breath sounds. No decreased breath sounds, wheezing, rhonchi or rales.  Abdominal:     General: Bowel sounds are normal.     Palpations: Abdomen is soft. There is no mass.     Tenderness: There is no abdominal tenderness. There is no guarding or rebound.  Musculoskeletal:        General: No tenderness. Normal range of motion.     Cervical back: Normal range of motion and neck supple.  Lymphadenopathy:     Cervical: No cervical adenopathy.  Skin:    General: Skin is warm.     Capillary Refill: Capillary refill takes less than 2 seconds.     Findings: No rash.  Neurological:     Mental Status: She is alert and oriented to person, place, and time.     Cranial Nerves: No cranial nerve deficit.     Deep Tendon Reflexes: Reflexes normal.  Psychiatric:        Mood and Affect: Mood is not anxious or depressed.    Wt Readings from Last 3 Encounters:  06/10/21 147 lb (66.7 kg)  02/22/21 148 lb (67.1 kg)  01/29/21 148 lb (67.1 kg)    BP 120/70    Pulse 80    Ht 5\' 6"  (1.676 m)    Wt 147 lb (66.7 kg)    BMI 23.73 kg/m   Assessment and Plan:  1. Familial multiple lipoprotein-type hyperlipidemia Chronic.  Controlled.  Stable.  Continue simvastatin 20 mg once a day.  Will check lipid panel for current level of surveillance of LDL - simvastatin (ZOCOR) 20 MG tablet; Take 1 tablet (20 mg total) by mouth at bedtime.  Dispense: 90 tablet; Refill: 1 - Lipid Panel With LDL/HDL Ratio  2. 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  Controlled.  Stable.  Continue Ditropan XL  5 mg  once a day. - oxybutynin (DITROPAN-XL) 5 MG 24 hr tablet; TAKE (1) TABLET BY MOUTH DAILY AT BEDTIME  Dispense: 90 tablet; Refill: 1  3. Taking medication for chronic disease Patient is on medications which can adversely affect electrolytes and other aspects.  We will check hepatic panel for surveillance of hepatotoxicity from statin therapy. - Hepatic function panel  4. Episodic paroxysmal anxiety disorder Episodic.  Patient has baseline anxiety that can be exacerbated by stressful situations.  We had a long discussion about appropriate times to take this medication and she is NOT to take this medication under driving circumstances.  Discussed that she is at risk given her age and that this is not a situation that she should ever take medications that may sedate her.  She has been given only 20 alprazolam 0.25 to take on a as needed basis when she is likely to be at home and for extreme anxiety provoking circumstances.  And this is the last her a year. - ALPRAZolam (XANAX) 0.25 MG tablet; Take 1 tablet (0.25 mg total) by mouth as needed for anxiety. Not to exceed 1 per 24 hr period  Dispense: 20 tablet; Refill: 0

## 2021-06-11 LAB — LIPID PANEL WITH LDL/HDL RATIO
Cholesterol, Total: 216 mg/dL — ABNORMAL HIGH (ref 100–199)
HDL: 55 mg/dL (ref >39)
LDL Chol Calc (NIH): 133 mg/dL — ABNORMAL HIGH (ref 0–99)
LDL/HDL Ratio: 2.4 ratio (ref 0.0–3.2)
Triglycerides: 155 mg/dL — ABNORMAL HIGH (ref 0–149)
VLDL Cholesterol Cal: 28 mg/dL (ref 5–40)

## 2021-06-11 LAB — HEPATIC FUNCTION PANEL
ALT: 19 IU/L (ref 0–32)
AST: 26 IU/L (ref 0–40)
Albumin: 4.4 g/dL (ref 3.7–4.7)
Alkaline Phosphatase: 85 IU/L (ref 44–121)
Bilirubin Total: 0.6 mg/dL (ref 0.0–1.2)
Bilirubin, Direct: 0.13 mg/dL (ref 0.00–0.40)
Total Protein: 7 g/dL (ref 6.0–8.5)

## 2021-06-25 DIAGNOSIS — G629 Polyneuropathy, unspecified: Secondary | ICD-10-CM | POA: Diagnosis not present

## 2021-06-25 DIAGNOSIS — R2689 Other abnormalities of gait and mobility: Secondary | ICD-10-CM | POA: Diagnosis not present

## 2021-06-25 DIAGNOSIS — R9089 Other abnormal findings on diagnostic imaging of central nervous system: Secondary | ICD-10-CM | POA: Diagnosis not present

## 2021-07-01 ENCOUNTER — Ambulatory Visit: Payer: Medicare Other | Admitting: Family Medicine

## 2021-07-02 ENCOUNTER — Ambulatory Visit (INDEPENDENT_AMBULATORY_CARE_PROVIDER_SITE_OTHER): Payer: Medicare Other | Admitting: Family Medicine

## 2021-07-02 ENCOUNTER — Other Ambulatory Visit: Payer: Self-pay

## 2021-07-02 ENCOUNTER — Encounter: Payer: Self-pay | Admitting: Family Medicine

## 2021-07-02 VITALS — BP 120/80 | HR 60 | Ht 66.0 in | Wt 148.0 lb

## 2021-07-02 DIAGNOSIS — L309 Dermatitis, unspecified: Secondary | ICD-10-CM

## 2021-07-02 DIAGNOSIS — R251 Tremor, unspecified: Secondary | ICD-10-CM

## 2021-07-02 MED ORDER — CLOTRIMAZOLE-BETAMETHASONE 1-0.05 % EX CREA: 1.0000 "application " | TOPICAL_CREAM | Freq: Every day | CUTANEOUS | 0 refills | Status: DC

## 2021-07-02 NOTE — Progress Notes (Signed)
Date:  07/02/2021   Name:  Melissa Daniels   DOB:  [birth year]   MRN:  [id number]   Chief Complaint: vaginal dryness (Itching and burning. No problems urinating, no sexual activities) and finger weakness (Approx 6 months- noticed when typing, "finger will droop down by itself or jiggle")  Female GU Problem The patient's primary symptoms include genital itching and genital lesions. The patient's pertinent negatives include no genital odor, genital rash, missed menses, pelvic pain, vaginal bleeding or vaginal discharge. Primary symptoms comment: tremorright index. This is a new problem. The current episode started 1 to 4 weeks ago (2 weeks). The problem occurs constantly. The problem has been unchanged. Pertinent negatives include no abdominal pain, back pain, chills, constipation, diarrhea, dysuria, fever, frequency, headaches, hematuria, nausea, rash, sore throat or urgency.  Neurologic Problem The patient's primary symptoms include weakness. The patient's pertinent negatives include no altered mental status or loss of balance. Primary symptoms comment: tremorright index. The neurological problem developed gradually. Pertinent negatives include no abdominal pain, back pain, chest pain, dizziness, fever, headaches, nausea, neck pain, palpitations or shortness of breath.   Lab Results  Component Value Date   NA 143 06/03/2020   K 4.7 06/03/2020   CO2 27 06/03/2020   GLUCOSE 94 06/03/2020   BUN 13 06/03/2020   CREATININE 0.79 06/03/2020   CALCIUM 9.8 06/03/2020   GFRNONAA 74 06/03/2020   Lab Results  Component Value Date   CHOL 216 (H) 06/10/2021   HDL 55 06/10/2021   LDLCALC 133 (H) 06/10/2021   TRIG 155 (H) 06/10/2021   CHOLHDL 3.5 11/27/2018   No results found for: TSH No results found for: HGBA1C Lab Results  Component Value Date   WBC 5.9 12/08/2020   HGB 13.4 12/08/2020   HCT 40.4 12/08/2020   MCV 92 12/08/2020   PLT 270 12/08/2020   Lab Results  Component Value Date   ALT  19 06/10/2021   AST 26 06/10/2021   ALKPHOS 85 06/10/2021   BILITOT 0.6 06/10/2021   No results found for: 25OHVITD2, 25OHVITD3, VD25OH   Review of Systems  Constitutional:  Negative for chills and fever.  HENT:  Negative for drooling, ear discharge, ear pain and sore throat.   Respiratory:  Negative for cough, shortness of breath and wheezing.   Cardiovascular:  Negative for chest pain, palpitations and leg swelling.  Gastrointestinal:  Negative for abdominal pain, blood in stool, constipation, diarrhea and nausea.  Endocrine: Negative for polydipsia.  Genitourinary:  Negative for dysuria, frequency, hematuria, missed menses, pelvic pain, urgency and vaginal discharge.  Musculoskeletal:  Negative for back pain, myalgias and neck pain.  Skin:  Negative for rash.  Allergic/Immunologic: Negative for environmental allergies.  Neurological:  Positive for weakness. Negative for dizziness, headaches and loss of balance.  Hematological:  Does not bruise/bleed easily.  Psychiatric/Behavioral:  Negative for suicidal ideas. The patient is not nervous/anxious.    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   Personal history of colonic polyps    Benign neoplasm of cecum  Benign neoplasm of transverse colon    Benign neoplasm of ascending colon    Malignant neoplasm of upper-outer quadrant of left breast in female, estrogen receptor positive ([place]) 01/10/2017   Neuropathy 10/31/2016   Taking medication for chronic disease 10/31/2016   Primary osteoarthritis of right hip  10/31/2016   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[place]) 10/19/2015   Menopause 10/19/2015   Hormone replacement therapy (HRT) 10/19/2015   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   Routine general medical examination at a health care facility 10/10/2014   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   Female stress incontinence 10/10/2014   Colon polyp 10/10/2014   Episodic paroxysmal anxiety disorder 10/10/2014    Allergies  Allergen Reactions   Aspirin    Macrobid [Nitrofurantoin]    Nsaids Other (See Comments)    History of ITP, 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;  Service: Endoscopy;  Laterality: N/A;  priority 4   POLYPECTOMY  09/08/2017   Procedure: POLYPECTOMY;  Surgeon: Lucilla Lame, MD;  Location: [facility];  Service: Endoscopy;;   SPLENECTOMY, TOTAL      Social History   Tobacco Use   Smoking status: Never   Smokeless tobacco: Never  Vaping Use   Vaping Use: Never used  Substance Use Topics   Alcohol use: Not Currently   Drug use: Never     Medication list has been reviewed and updated.  Current Meds  Medication Sig   ALPRAZolam (XANAX) 0.25 MG tablet Take 1 tablet (0.25 mg total) by mouth as needed for anxiety. Not to exceed 1 per 24 hr period   Calcium Citrate-Vitamin D (CALCIUM + D PO) Take 1 tablet by mouth 2 (two) times daily.   Cholecalciferol (VITAMIN D3) 25 MCG  (1000 UT) CAPS Take 1 capsule by mouth daily.   DULoxetine (CYMBALTA) 30 MG capsule Take 90 mg by mouth in the morning and at bedtime.   gabapentin (NEURONTIN) 300 MG capsule Take 1 capsule (300 mg total) by mouth at bedtime. (Patient taking differently: Take 300 mg by mouth in the morning and at bedtime. [facility] doctor- morning and night)   Magnesium 400 MG CAPS Take 1 capsule by mouth 2 (two) times daily.    Multiple Vitamins-Minerals (OCUVITE ADULT 50+ PO) Take 1 tablet by mouth in the morning and at bedtime.   oxybutynin (DITROPAN-XL) 5 MG 24 hr tablet TAKE (1) TABLET BY MOUTH DAILY AT BEDTIME   simvastatin (ZOCOR) 20 MG tablet Take 1 tablet (20 mg total) by mouth at bedtime.   vitamin E 400 UNIT capsule Take 400 Units by mouth daily.    PHQ 2/9 Scores 06/10/2021 02/22/2021 01/29/2021 12/08/2020  PHQ - 2 Score 0 0 0 0  PHQ- 9 Score 0 0 0 0    GAD 7 : Generalized Anxiety Score 06/10/2021 02/22/2021 01/29/2021 12/08/2020  Nervous, Anxious, on Edge 0 0 0 0  Control/stop worrying 0 0 0 0  Worry too much - different things 0 0 0 0  Trouble relaxing 0 0 0 0  Restless 0 0 0 0  Easily annoyed or irritable 0 0 0 0  Afraid - awful might happen 0 0 0 0  Total GAD 7 Score 0 0 0 0  Anxiety Difficulty Not difficult at all Not difficult at all - -    BP Readings from Last 3 Encounters:  07/02/21 120/80  06/10/21 120/70  02/22/21 94/64    Physical Exam Vitals and nursing note reviewed.  Constitutional:      Appearance: She is well-developed.  HENT:     Head: Normocephalic.     Right Ear: Tympanic membrane and external ear normal.     Left Ear: Tympanic membrane and external ear normal.  Eyes:     General: Lids are everted, no foreign bodies appreciated. No scleral icterus.       Left eye: No foreign body or hordeolum.     Conjunctiva/sclera: Conjunctivae normal.     Right eye: Right conjunctiva is not injected.     Left eye: Left conjunctiva is not injected.     Pupils: Pupils are equal, round,  and reactive to light.  Neck:     Thyroid: No thyromegaly.     Vascular: No JVD.     Trachea: No tracheal deviation.  Cardiovascular:     Rate and Rhythm: Normal rate and regular rhythm.     Heart sounds: Normal heart sounds. No murmur heard.   No friction rub. No gallop.  Pulmonary:     Effort: Pulmonary effort is normal. No respiratory distress.     Breath sounds: Normal breath sounds. No wheezing, rhonchi or rales.  Abdominal:     General: Bowel sounds are normal.     Palpations: Abdomen is soft. There is no mass.     Tenderness: There is no abdominal tenderness. There is no guarding or rebound.  Genitourinary:    Labia:        Right: Rash present. No tenderness.        Left: Rash present. No tenderness.     Musculoskeletal:        General: No tenderness. Normal range of motion.     Cervical back: Normal range of motion and neck supple.  Lymphadenopathy:     Cervical: No cervical adenopathy.  Skin:    General: Skin is warm.     Findings: No rash.  Neurological:     Mental Status: She is alert and oriented to person, place, and time.     Cranial Nerves: Cranial nerves 2-12 are intact. No cranial nerve deficit.     Sensory: Sensation is intact.     Motor: Motor function is intact.     Deep Tendon Reflexes: Reflexes normal.     Reflex Scores:      Tricep reflexes are 2+ on the right side and 2+ on the left side.      Bicep reflexes are 2+ on the right side and 2+ on the left side.      Brachioradialis reflexes are 2+ on the right side and 2+ on the left side.      Patellar reflexes are 2+ on the right side and 2+ on the left side.      Achilles reflexes are 2+ on the right side and 2+ on the left side. Psychiatric:        Mood and Affect: Mood is not anxious or depressed.  Wt Readings from Last 3 Encounters:  07/02/21 148 lb (67.1 kg)  06/10/21 147 lb (66.7 kg)  02/22/21 148 lb (67.1 kg)    BP 120/80    Pulse 60    Ht 5\' 6"  (1.676 m)    Wt 148 lb (67.1 kg)    BMI  23.89 kg/m   Assessment and Plan:  1. Vulvar dermatitis New onset.  Superficial No vesicular.  Macular areas of salmon like lesions.  Has an appearance that resemble psoriasis but may be dermatitis in nature and the possibility of a tinea is there as well we will do a trial of once a day Lotrisone and if there is not complete resolution and control neck step will be referral to dermatology. - clotrimazole-betamethasone (LOTRISONE) cream; Apply 1 application topically daily.  Dispense: 30 g; Refill: 0  2. Tremor New onset.  Neurologic exam is normal.  Patient notices when she is texting that her right index will began to dorsiflex and she also has a fine tremor.  I suspect that this is an essential tremor circumstance I reassured her that is unlikely to be parkinsonism but if it continues her next step will be dermatology consult.  Patient is also been suggested to use Gas-X and to avoid taking an air when she is eating.

## 2021-07-06 DIAGNOSIS — H4922 Sixth [abducent] nerve palsy, left eye: Secondary | ICD-10-CM | POA: Diagnosis not present

## 2021-07-06 DIAGNOSIS — H5000 Unspecified esotropia: Secondary | ICD-10-CM | POA: Diagnosis not present

## 2021-07-21 DIAGNOSIS — Z872 Personal history of diseases of the skin and subcutaneous tissue: Secondary | ICD-10-CM | POA: Diagnosis not present

## 2021-07-21 DIAGNOSIS — L578 Other skin changes due to chronic exposure to nonionizing radiation: Secondary | ICD-10-CM | POA: Diagnosis not present

## 2021-07-21 DIAGNOSIS — Z859 Personal history of malignant neoplasm, unspecified: Secondary | ICD-10-CM | POA: Diagnosis not present

## 2021-07-21 DIAGNOSIS — L821 Other seborrheic keratosis: Secondary | ICD-10-CM | POA: Diagnosis not present

## 2021-07-21 DIAGNOSIS — L570 Actinic keratosis: Secondary | ICD-10-CM | POA: Diagnosis not present

## 2021-09-08 ENCOUNTER — Ambulatory Visit: Payer: Medicare Other

## 2021-09-15 ENCOUNTER — Ambulatory Visit (INDEPENDENT_AMBULATORY_CARE_PROVIDER_SITE_OTHER): Payer: Medicare Other

## 2021-09-15 DIAGNOSIS — Z0000 Encounter for general adult medical examination without abnormal findings: Secondary | ICD-10-CM

## 2021-09-15 NOTE — Patient Instructions (Signed)
Ms. Matthew , ?Thank you for taking time to come for your Medicare Wellness Visit. I appreciate your ongoing commitment to your health goals. Please review the following plan we discussed and let me know if I can assist you in the future.  ? ?Screening recommendations/referrals: ?Colonoscopy: done 09/03/20. Repeat 08/2023 ?Mammogram: done 03/12/21 ?Bone Density: done 07/04/19 ?Recommended yearly ophthalmology/optometry visit for glaucoma screening and checkup ?Recommended yearly dental visit for hygiene and checkup ? ?Vaccinations: ?Influenza vaccine: due fall 2023 ?Pneumococcal vaccine: done 2016 ?Tdap vaccine: done 10/19/15 ?Shingles vaccine: Shingrix discussed. Please contact your pharmacy for coverage information.  ?Covid-19:done 08/16/19 & 09/06/19 ? ?Conditions/risks identified: Keep up the great work! ? ?Next appointment: Follow up in one year for your annual wellness visit  ? ? ?Preventive Care 49 Years and Older, Female ?Preventive care refers to lifestyle choices and visits with your health care provider that can promote health and wellness. ?What does preventive care include? ?A yearly physical exam. This is also called an annual well check. ?Dental exams once or twice a year. ?Routine eye exams. Ask your health care provider how often you should have your eyes checked. ?Personal lifestyle choices, including: ?Daily care of your teeth and gums. ?Regular physical activity. ?Eating a healthy diet. ?Avoiding tobacco and drug use. ?Limiting alcohol use. ?Practicing safe sex. ?Taking low-dose aspirin every day. ?Taking vitamin and mineral supplements as recommended by your health care provider. ?What happens during an annual well check? ?The services and screenings done by your health care provider during your annual well check will depend on your age, overall health, lifestyle risk factors, and family history of disease. ?Counseling  ?Your health care provider may ask you questions about your: ?Alcohol use. ?Tobacco  use. ?Drug use. ?Emotional well-being. ?Home and relationship well-being. ?Sexual activity. ?Eating habits. ?History of falls. ?Memory and ability to understand (cognition). ?Work and work Statistician. ?Reproductive health. ?Screening  ?You may have the following tests or measurements: ?Height, weight, and BMI. ?Blood pressure. ?Lipid and cholesterol levels. These may be checked every 5 years, or more frequently if you are over 72 years old. ?Skin check. ?Lung cancer screening. You may have this screening every year starting at age 58 if you have a 30-pack-year history of smoking and currently smoke or have quit within the past 15 years. ?Fecal occult blood test (FOBT) of the stool. You may have this test every year starting at age 8. ?Flexible sigmoidoscopy or colonoscopy. You may have a sigmoidoscopy every 5 years or a colonoscopy every 10 years starting at age 52. ?Hepatitis C blood test. ?Hepatitis B blood test. ?Sexually transmitted disease (STD) testing. ?Diabetes screening. This is done by checking your blood sugar (glucose) after you have not eaten for a while (fasting). You may have this done every 1-3 years. ?Bone density scan. This is done to screen for osteoporosis. You may have this done starting at age 36. ?Mammogram. This may be done every 1-2 years. Talk to your health care provider about how often you should have regular mammograms. ?Talk with your health care provider about your test results, treatment options, and if necessary, the need for more tests. ?Vaccines  ?Your health care provider may recommend certain vaccines, such as: ?Influenza vaccine. This is recommended every year. ?Tetanus, diphtheria, and acellular pertussis (Tdap, Td) vaccine. You may need a Td booster every 10 years. ?Zoster vaccine. You may need this after age 27. ?Pneumococcal 13-valent conjugate (PCV13) vaccine. One dose is recommended after age 41. ?Pneumococcal polysaccharide (PPSV23) vaccine.  One dose is recommended  after age 81. ?Talk to your health care provider about which screenings and vaccines you need and how often you need them. ?This information is not intended to replace advice given to you by your health care provider. Make sure you discuss any questions you have with your health care provider. ?Document Released: 06/12/2015 Document Revised: 02/03/2016 Document Reviewed: 03/17/2015 ?Elsevier Interactive Patient Education ? 2017 [place]. ? ?Fall Prevention in the Home ?Falls can cause injuries. They can happen to people of all ages. There are many things you can do to make your home safe and to help prevent falls. ?What can I do on the outside of my home? ?Regularly fix the edges of walkways and driveways and fix any cracks. ?Remove anything that might make you trip as you walk through a door, such as a raised step or threshold. ?Trim any bushes or trees on the path to your home. ?Use bright outdoor lighting. ?Clear any walking paths of anything that might make someone trip, such as rocks or tools. ?Regularly check to see if handrails are loose or broken. Make sure that both sides of any steps have handrails. ?Any raised decks and porches should have guardrails on the edges. ?Have any leaves, snow, or ice cleared regularly. ?Use sand or salt on walking paths during winter. ?Clean up any spills in your garage right away. This includes oil or grease spills. ?What can I do in the bathroom? ?Use night lights. ?Install grab bars by the toilet and in the tub and shower. Do not use towel bars as grab bars. ?Use non-skid mats or decals in the tub or shower. ?If you need to sit down in the shower, use a plastic, non-slip stool. ?Keep the floor dry. Clean up any water that spills on the floor as soon as it happens. ?Remove soap buildup in the tub or shower regularly. ?Attach bath mats securely with double-sided non-slip rug tape. ?Do not have throw rugs and other things on the floor that can make you trip. ?What can I do  in the bedroom? ?Use night lights. ?Make sure that you have a light by your bed that is easy to reach. ?Do not use any sheets or blankets that are too big for your bed. They should not hang down onto the floor. ?Have a firm chair that has side arms. You can use this for support while you get dressed. ?Do not have throw rugs and other things on the floor that can make you trip. ?What can I do in the kitchen? ?Clean up any spills right away. ?Avoid walking on wet floors. ?Keep items that you use a lot in easy-to-reach places. ?If you need to reach something above you, use a strong step stool that has a grab bar. ?Keep electrical cords out of the way. ?Do not use floor polish or wax that makes floors slippery. If you must use wax, use non-skid floor wax. ?Do not have throw rugs and other things on the floor that can make you trip. ?What can I do with my stairs? ?Do not leave any items on the stairs. ?Make sure that there are handrails on both sides of the stairs and use them. Fix handrails that are broken or loose. Make sure that handrails are as long as the stairways. ?Check any carpeting to make sure that it is firmly attached to the stairs. Fix any carpet that is loose or worn. ?Avoid having throw rugs at the top or bottom of  the stairs. If you do have throw rugs, attach them to the floor with carpet tape. ?Make sure that you have a light switch at the top of the stairs and the bottom of the stairs. If you do not have them, ask someone to add them for you. ?What else can I do to help prevent falls? ?Wear shoes that: ?Do not have high heels. ?Have rubber bottoms. ?Are comfortable and fit you well. ?Are closed at the toe. Do not wear sandals. ?If you use a stepladder: ?Make sure that it is fully opened. Do not climb a closed stepladder. ?Make sure that both sides of the stepladder are locked into place. ?Ask someone to hold it for you, if possible. ?Clearly mark and make sure that you can see: ?Any grab bars or  handrails. ?First and last steps. ?Where the edge of each step is. ?Use tools that help you move around (mobility aids) if they are needed. These include: ?Canes. ?Walkers. ?Scooters. ?Crutches. ?Turn on the light

## 2021-09-15 NOTE — Progress Notes (Signed)
? ?Subjective:  ? Melissa Daniels is a 76 y.o. female who presents for Medicare Annual (Subsequent) preventive examination. ? ?Virtual Visit via Telephone Note ? ?I connected with  Melissa Daniels on 09/15/21 at  1:00 PM EDT by telephone and verified that I am speaking with the correct person using two identifiers. ? ?Location: ?Patient: home ?Provider: [facility] ?Persons participating in the virtual visit: patient/Nurse Health Advisor ?  ?I discussed the limitations, risks, security and privacy concerns of performing an evaluation and management service by telephone and the availability of in person appointments. The patient expressed understanding and agreed to proceed. ? ?Interactive audio and video telecommunications were attempted between this nurse and patient, however failed, due to patient having technical difficulties OR patient did not have access to video capability.  We continued and completed visit with audio only. ? ?Some vital signs may be absent or patient reported.  ? ?Clemetine Marker, LPN ? ? ?Review of Systems    ? ?Cardiac Risk Factors include: advanced age (>69mn, >>75women);dyslipidemia ? ?   ?Objective:  ?  ?There were no vitals filed for this visit. ?There is no height or weight on file to calculate BMI. ? ? ?  09/15/2021  ?  1:19 PM 11/06/2020  ? 10:26 AM 09/11/2020  ?  8:03 AM 09/07/2020  ?  1:35 PM 09/03/2020  ?  7:19 AM 08/28/2019  ?  1:33 PM 12/14/2018  ?  9:13 AM  ?Advanced Directives  ?Does Patient Have a Medical Advance Directive? Yes No Yes Yes Yes Yes Yes  ?Type of AParamedicof AJonesvilleLiving will  HSt. MarieLiving will HReinertonLiving will HArchbaldLiving will HRattanLiving will HShell PointLiving will  ?Does patient want to make changes to medical advance directive?     No - Patient declined    ?Copy of HAnnetta Northin Chart? Yes - validated most recent copy scanned in  chart (See row information)   No - copy requested Yes - validated most recent copy scanned in chart (See row information) No - copy requested   ? ? ?Current Medications (verified) ?Outpatient Encounter Medications as of 09/15/2021  ?Medication Sig  ? ALPRAZolam (XANAX) 0.25 MG tablet Take 1 tablet (0.25 mg total) by mouth as needed for anxiety. Not to exceed 1 per 24 hr period  ? Calcium Citrate-Vitamin D (CALCIUM + D PO) Take 1 tablet by mouth 2 (two) times daily.  ? Cholecalciferol (VITAMIN D3) 25 MCG (1000 UT) CAPS Take 1 capsule by mouth daily.  ? clotrimazole-betamethasone (LOTRISONE) cream Apply 1 application topically daily.  ? DULoxetine (CYMBALTA) 60 MG capsule Take 60 mg by mouth daily.  ? gabapentin (NEURONTIN) 300 MG capsule Take 1 capsule (300 mg total) by mouth at bedtime. (Patient taking differently: Take 300 mg by mouth in the morning and at bedtime. [facility] doctor- morning and night)  ? Magnesium 400 MG CAPS Take 1 capsule by mouth 2 (two) times daily.   ? Multiple Vitamins-Minerals (OCUVITE ADULT 50+ PO) Take 1 tablet by mouth in the morning and at bedtime.  ? oxybutynin (DITROPAN-XL) 5 MG 24 hr tablet TAKE (1) TABLET BY MOUTH DAILY AT BEDTIME  ? simvastatin (ZOCOR) 20 MG tablet Take 1 tablet (20 mg total) by mouth at bedtime.  ? vitamin E 400 UNIT capsule Take 400 Units by mouth daily.  ? [DISCONTINUED] DULoxetine (CYMBALTA) 30 MG capsule Take 90 mg by mouth in the  morning and at bedtime.  ? ?No facility-administered encounter medications on file as of 09/15/2021.  ? ? ?Allergies (verified) ?Aspirin, Macrobid [nitrofurantoin], Nsaids, and Sulfa antibiotics  ? ?History: ?Past Medical History:  ?Diagnosis Date  ? Anxiety   ? 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 Double vision   ? Hypercholesteremia   ? ITP (idiopathic thrombocytopenic purpura)   ? Malignant neoplasm of upper-outer quadrant of left breast in female, estrogen receptor positive ([place]) 01/10/2017  ? Neuropathy   ? bilateral feet  ? Osteoporosis   ? Vertigo    ? several episodes per year  ? ?Past Surgical History:  ?Procedure Laterality Date  ? BREAST BIOPSY Left 12/28/2016  ? stereo path pend  ? BREAST LUMPECTOMY    ? COLONOSCOPY WITH PROPOFOL N/A 09/08/2017  ? Procedure: COLONOSCOPY WITH PROPOFOL;  Surgeon: Lucilla Lame, MD;  Location: [place];  Service: Endoscopy;  Laterality: N/A;  ? COLONOSCOPY WITH PROPOFOL N/A 09/03/2020  ? Procedure: COLONOSCOPY WITH PROPOFOL;  Surgeon: Lucilla Lame, MD;  Location: [place];  Service: Endoscopy;  Laterality: N/A;  priority 4  ? EYE SURGERY    ? POLYPECTOMY  09/08/2017  ? Procedure: POLYPECTOMY;  Surgeon: Lucilla Lame, MD;  Location: [place];  Service: Endoscopy;;  ? SPLENECTOMY, TOTAL    ? ?Family History  ?Problem Relation Age of Onset  ? Heart disease Mother   ? Colon cancer Mother   ? Hypertension Mother   ? Heart disease Father   ? Hypertension Father   ? Breast cancer Neg Hx   ? ?Social History  ? ?Socioeconomic History  ? Marital status: Widowed  ?  Spouse name: Not on file  ? Number of children: 3  ? Years of education: 61  ? Highest education level: High school graduate  ?Occupational History  ? Occupation: Retired  ?Tobacco Use  ? Smoking status: Never  ? Smokeless tobacco: Never  ?Vaping Use  ? Vaping Use: Never used  ?Substance and Sexual Activity  ? Alcohol use: Not Currently  ? Drug use: Never  ? Sexual activity: Not Currently  ?  Partners: Male  ?Other Topics Concern  ? Not on file  ?Social History Narrative  ? Pt lives alone  ? ?Social Determinants of Health  ? ?Financial Resource Strain: Low Risk   ? Difficulty of Paying Living Expenses: Not hard at all  ?Food Insecurity: No Food Insecurity  ? Worried About Charity fundraiser in the Last Year: Never true  ? Ran Out of Food in the Last Year: Never true  ?Transportation Needs: No Transportation Needs  ? Lack of Transportation (Medical): No  ? Lack of Transportation (Non-Medical): No  ?Physical Activity: Insufficiently Active  ?  Days of Exercise per Week: 5 days  ? Minutes of Exercise per Session: 20 min  ?Stress: No Stress Concern Present  ? Feeling of Stress : Only a little  ?Social Connections: Moderately Isolated  ? Frequency of Communication with Friends and Family: More than three times a week  ? Frequency of Social Gatherings with Friends and Family: Three times a week  ? Attends Religious Services: More than 4 times per year  ? Active Member of Clubs or Organizations: No  ? Attends Archivist Meetings: Never  ? Marital Status: Widowed  ? ? ?Tobacco Counseling ?Counseling given: Not Answered ? ? ?Clinical Intake: ? ?Pre-visit preparation completed: Yes ? ?Pain : No/denies pain ? ?  ? ?Diabetes: No ? ?How often do  you need to have someone help you when you read instructions, pamphlets, or other written materials from your doctor or pharmacy?: 1 - Never ? ? ? ?Interpreter Needed?: No ? ?Information entered by :: Clemetine Marker LPN ? ? ?Activities of Daily Living ? ?  09/15/2021  ?  1:20 PM 02/22/2021  ? 11:46 AM  ?In your present state of health, do you have any difficulty performing the following activities:  ?Hearing? 0 0  ?Vision? 0 0  ?Difficulty concentrating or making decisions? 0 0  ?Walking or climbing stairs? 0 0  ?Dressing or bathing? 0 0  ?Doing errands, shopping? 0 0  ?Preparing Food and eating ? N   ?Using the Toilet? N   ?In the past six months, have you accidently leaked urine? Y   ?Do you have problems with loss of bowel control? N   ?Managing your Medications? N   ?Managing your Finances? N   ?Housekeeping or managing your Housekeeping? N   ? ? ?Patient Care Team: ?Juline Patch, MD as PCP - General (Family Medicine) ?Cornelia Copa, DO as Consulting Physician (Surgical Oncology) ?Reeder-Hayes, Aundra Millet, MD as Consulting Physician (Oncology) ?Pa, [place] as Consulting Physician (Optometry) ? ?Indicate any recent Medical Services you may have received from other than [facility] providers in the  past year (date may be approximate). ? ?   ?Assessment:  ? This is a routine wellness examination for Melissa Daniels. ? ?Hearing/Vision screen ?Hearing Screening - Comments:: Pt denies hearing difficulty ?[place]

## 2021-12-08 ENCOUNTER — Encounter: Payer: Self-pay | Admitting: Family Medicine

## 2021-12-08 ENCOUNTER — Ambulatory Visit (INDEPENDENT_AMBULATORY_CARE_PROVIDER_SITE_OTHER): Payer: Medicare Other | Admitting: Family Medicine

## 2021-12-08 VITALS — BP 120/78 | HR 82 | Ht 66.0 in | Wt 144.0 lb

## 2021-12-08 DIAGNOSIS — E7849 Other hyperlipidemia: Secondary | ICD-10-CM

## 2021-12-08 DIAGNOSIS — N393 Stress incontinence (female) (male): Secondary | ICD-10-CM | POA: Diagnosis not present

## 2021-12-08 DIAGNOSIS — B3731 Acute candidiasis of vulva and vagina: Secondary | ICD-10-CM

## 2021-12-08 DIAGNOSIS — F410 Panic disorder [episodic paroxysmal anxiety] without agoraphobia: Secondary | ICD-10-CM

## 2021-12-08 DIAGNOSIS — D693 Immune thrombocytopenic purpura: Secondary | ICD-10-CM

## 2021-12-08 DIAGNOSIS — R300 Dysuria: Secondary | ICD-10-CM | POA: Diagnosis not present

## 2021-12-08 LAB — POCT URINALYSIS DIPSTICK
Bilirubin, UA: NEGATIVE
Blood, UA: NEGATIVE
Glucose, UA: NEGATIVE
Ketones, UA: NEGATIVE
Leukocytes, UA: NEGATIVE
Nitrite, UA: NEGATIVE
Protein, UA: NEGATIVE
Spec Grav, UA: 1.01 (ref 1.010–1.025)
Urobilinogen, UA: 0.2 E.U./dL
pH, UA: 6 (ref 5.0–8.0)

## 2021-12-08 MED ORDER — OXYBUTYNIN CHLORIDE ER 5 MG PO TB24: ORAL_TABLET | ORAL | 1 refills | Status: DC

## 2021-12-08 MED ORDER — SIMVASTATIN 20 MG PO TABS: 20.0000 mg | ORAL_TABLET | Freq: Every day | ORAL | 1 refills | Status: DC

## 2021-12-08 MED ORDER — FLUCONAZOLE 150 MG PO TABS: 150.0000 mg | ORAL_TABLET | Freq: Once | ORAL | 0 refills | Status: AC

## 2021-12-08 NOTE — Progress Notes (Signed)
Date:  12/08/2021   Name:  Melissa Daniels   DOB:  Dec 02, 1945   MRN:  [id number]   Chief Complaint: Hyperlipidemia, Anxiety, overactive bladder, and Vaginitis (Monistat helped)  Hyperlipidemia This is a chronic problem. The current episode started more than 1 year ago. The problem is controlled. Recent lipid tests were reviewed and are normal. She has no history of chronic renal disease, diabetes, hypothyroidism, liver disease, obesity or nephrotic syndrome. There are no known factors aggravating her hyperlipidemia. Pertinent negatives include no chest pain, focal sensory loss, focal weakness, leg pain, myalgias or shortness of breath. Current antihyperlipidemic treatment includes statins. The current treatment provides mild improvement of lipids. There are no compliance problems.  Risk factors for coronary artery disease include dyslipidemia.  Anxiety Presents for follow-up visit. Symptoms include nervous/anxious behavior. Patient reports no chest pain, dizziness, excessive worry, irritability, nausea, palpitations, shortness of breath or suicidal ideas.      Lab Results  Component Value Date   NA 143 06/03/2020   K 4.7 06/03/2020   CO2 27 06/03/2020   GLUCOSE 94 06/03/2020   BUN 13 06/03/2020   CREATININE 0.79 06/03/2020   CALCIUM 9.8 06/03/2020   GFRNONAA 74 06/03/2020   Lab Results  Component Value Date   CHOL 216 (H) 06/10/2021   HDL 55 06/10/2021   LDLCALC 133 (H) 06/10/2021   TRIG 155 (H) 06/10/2021   CHOLHDL 3.5 11/27/2018   No results found for: "TSH" No results found for: "HGBA1C" Lab Results  Component Value Date   WBC 5.9 12/08/2020   HGB 13.4 12/08/2020   HCT 40.4 12/08/2020   MCV 92 12/08/2020   PLT 270 12/08/2020   Lab Results  Component Value Date   ALT 19 06/10/2021   AST 26 06/10/2021   ALKPHOS 85 06/10/2021   BILITOT 0.6 06/10/2021   No results found for: "25OHVITD2", "25OHVITD3", "VD25OH"   Review of Systems  Constitutional:  Negative for  chills, fever and irritability.  HENT:  Negative for drooling, ear discharge, ear pain and sore throat.   Respiratory:  Negative for cough, shortness of breath and wheezing.   Cardiovascular:  Negative for chest pain, palpitations and leg swelling.  Gastrointestinal:  Negative for abdominal pain, blood in stool, constipation, diarrhea and nausea.  Endocrine: Negative for polydipsia.  Genitourinary:  Negative for dysuria, frequency, hematuria and urgency.  Musculoskeletal:  Negative for back pain, myalgias and neck pain.  Skin:  Negative for rash.  Allergic/Immunologic: Negative for environmental allergies.  Neurological:  Negative for dizziness, focal weakness and headaches.  Hematological:  Does not bruise/bleed easily.  Psychiatric/Behavioral:  Negative for suicidal ideas. The patient is nervous/anxious.     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   Personal history of colonic polyps    Benign neoplasm of cecum    Benign neoplasm of transverse colon    Benign neoplasm of ascending colon    Malignant neoplasm of upper-outer quadrant of left breast in female, estrogen receptor positive ([place]) 01/10/2017   Neuropathy 10/31/2016   Taking medication for  chronic disease 10/31/2016   Primary osteoarthritis of right hip 10/31/2016   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[place]) 10/19/2015   Menopause 10/19/2015   Hormone replacement therapy (HRT) 10/19/2015   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   Routine general medical examination at a health care  facility 10/10/2014   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   Female stress incontinence 10/10/2014   Colon polyp 10/10/2014   Episodic paroxysmal anxiety disorder 10/10/2014    Allergies  Allergen Reactions   Aspirin    Macrobid [Nitrofurantoin]    Nsaids Other (See Comments)    History of ITP, 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, 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    Sulfa Antibiotics Other (See Comments)    Platelets drop     Past Surgical History:  Procedure Laterality Date   BREAST BIOPSY Left 12/28/2016   stereo path pend   BREAST LUMPECTOMY     COLONOSCOPY WITH PROPOFOL N/A 09/08/2017   Procedure: COLONOSCOPY WITH PROPOFOL;  Surgeon: Lucilla Lame, MD;  Location: [place];  Service: Endoscopy;  Laterality: N/A;   COLONOSCOPY WITH PROPOFOL N/A 09/03/2020   Procedure: COLONOSCOPY WITH PROPOFOL;  Surgeon: Lucilla Lame, MD;  Location: [facility];  Service: Endoscopy;  Laterality: N/A;  priority 4   EYE SURGERY     POLYPECTOMY  09/08/2017   Procedure: POLYPECTOMY;  Surgeon: Lucilla Lame, MD;  Location: [facility];  Service: Endoscopy;;   SPLENECTOMY, TOTAL      Social History   Tobacco Use   Smoking status: Never   Smokeless tobacco: Never  Vaping Use   Vaping Use: Never used  Substance Use Topics   Alcohol use: Not Currently   Drug use: Never     Medication list has been reviewed and updated.  Current Meds  Medication Sig   ALPRAZolam (XANAX) 0.25 MG tablet Take 1 tablet (0.25 mg total) by mouth as needed for anxiety. Not to exceed 1 per 24 hr period   Calcium Citrate-Vitamin D (CALCIUM + D PO) Take 1 tablet by mouth 2 (two) times daily.   Cholecalciferol (VITAMIN D3) 25 MCG (1000 UT) CAPS Take 1 capsule by mouth daily.   clotrimazole-betamethasone (LOTRISONE) cream Apply 1 application topically daily.   DULoxetine (CYMBALTA) 60 MG capsule Take 60 mg by mouth daily.   gabapentin (NEURONTIN) 300 MG capsule  Take 1 capsule (300 mg total) by mouth at bedtime. (Patient taking differently: Take 300 mg by mouth in the morning and at bedtime. [facility] doctor- morning and night)   Multiple Vitamins-Minerals (OCUVITE ADULT 50+ PO) Take 1 tablet by mouth in the morning and at bedtime.   oxybutynin (DITROPAN-XL) 5 MG 24 hr tablet TAKE (1) TABLET BY MOUTH DAILY AT BEDTIME   simvastatin (ZOCOR) 20 MG tablet Take 1 tablet (20 mg total) by mouth at bedtime.   vitamin E 400 UNIT capsule Take 400 Units by mouth daily.   [DISCONTINUED] Magnesium 400 MG CAPS Take 1 capsule by mouth 2 (two) times daily.        06/10/2021    8:07 AM 02/22/2021   11:46 AM 01/29/2021   10:17 AM 12/08/2020    8:26 AM  GAD 7 : Generalized Anxiety Score  Nervous, Anxious, on Edge 0 0 0 0  Control/stop worrying 0 0 0 0  Worry too much - different things 0 0 0 0  Trouble relaxing 0 0 0 0  Restless 0 0 0 0  Easily annoyed or irritable 0 0 0 0  Afraid - awful might happen 0 0 0 0  Total GAD 7 Score 0 0 0 0  Anxiety Difficulty Not difficult at all Not difficult at all         09/15/2021    1:18 PM 06/10/2021    8:07 AM 02/22/2021   11:46 AM  Depression screen PHQ 2/9  Decreased Interest 0 0 0  Down, Depressed, Hopeless 1 0 0  PHQ - 2 Score 1 0 0  Altered sleeping  0 0  Tired, decreased energy  0 0  Change in appetite  0 0  Feeling bad or failure about yourself   0 0  Trouble concentrating  0 0  Moving slowly or fidgety/restless  0 0  Suicidal thoughts  0 0  PHQ-9 Score  0 0  Difficult doing work/chores  Not difficult at all Not difficult at all    BP Readings from Last 3 Encounters:  12/08/21 120/78  07/02/21 120/80  06/10/21 120/70    Physical Exam  Wt Readings from Last 3 Encounters:  12/08/21 144 lb (65.3 kg)  07/02/21 148 lb (67.1 kg)  06/10/21 147 lb (66.7 kg)    BP 120/78   Pulse 82   Ht '5\' 6"'$  (1.676 m)   Wt 144 lb (65.3 kg)   BMI 23.24 kg/m   Assessment and Plan:  1. Familial multiple lipoprotein-type  hyperlipidemia .  Controlled.  Stable.  Will check lipid panel and if controlled will give some consideration to discontinuing simvastatin otherwise we will continue at 20 mg once nightly.  We will check renal function panel for electrolytes. - simvastatin (ZOCOR) 20 MG tablet; Take 1 tablet (20 mg total) by mouth at bedtime.  Dispense: 90 tablet; Refill: 1 - Lipid Panel With LDL/HDL Ratio - Renal Function Panel  2. Episodic paroxysmal anxiety disorder Chronic.  Controlled.  Stable.  Rarely takes her Xanax.  PHQ is controlled and we will continue to monitor on as-needed basis.  3. 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  Controlled.  Stable.  Continue Ditropan XL 5 mg once a day. - oxybutynin (DITROPAN-XL) 5 MG 24 hr tablet; TAKE (1) TABLET BY MOUTH DAILY AT BEDTIME  Dispense: 90 tablet; Refill: 1  4. Yeast vaginitis Jeanett Schlein is having some vaginal irritation and is controlled somewhat with Monistat we will come in with a Diflucan 150 mg for one-time dosing. - fluconazole (DIFLUCAN) 150 MG tablet; Take 1 tablet (150 mg total) by mouth once for 1 dose.  Dispense: 1 tablet; Refill: 0  5. 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 - CBC w/Diff/Platelet  6. Dysuria Dysuria associated with irritation likely atrophic vaginitis versus candidiasis. - POCT urinalysis dipstick

## 2021-12-09 LAB — CBC WITH DIFFERENTIAL/PLATELET
Basophils Absolute: 0.1 10*3/uL (ref 0.0–0.2)
Basos: 1 %
EOS (ABSOLUTE): 0.1 10*3/uL (ref 0.0–0.4)
Eos: 3 %
Hematocrit: 38.7 % (ref 34.0–46.6)
Hemoglobin: 13.2 g/dL (ref 11.1–15.9)
Immature Grans (Abs): 0 10*3/uL (ref 0.0–0.1)
Immature Granulocytes: 0 %
Lymphocytes Absolute: 2.2 10*3/uL (ref 0.7–3.1)
Lymphs: 46 %
MCH: 31.1 pg (ref 26.6–33.0)
MCHC: 34.1 g/dL (ref 31.5–35.7)
MCV: 91 fL (ref 79–97)
Monocytes Absolute: 0.7 10*3/uL (ref 0.1–0.9)
Monocytes: 15 %
Neutrophils Absolute: 1.7 10*3/uL (ref 1.4–7.0)
Neutrophils: 35 %
Platelets: 267 10*3/uL (ref 150–450)
RBC: 4.24 x10E6/uL (ref 3.77–5.28)
RDW: 12.9 % (ref 11.7–15.4)
WBC: 4.8 10*3/uL (ref 3.4–10.8)

## 2021-12-09 LAB — RENAL FUNCTION PANEL
Albumin: 4.5 g/dL (ref 3.8–4.8)
BUN/Creatinine Ratio: 18 (ref 12–28)
BUN: 16 mg/dL (ref 8–27)
CO2: 25 mmol/L (ref 20–29)
Calcium: 9.5 mg/dL (ref 8.7–10.3)
Chloride: 102 mmol/L (ref 96–106)
Creatinine, Ser: 0.87 mg/dL (ref 0.57–1.00)
Glucose: 97 mg/dL (ref 70–99)
Phosphorus: 3.7 mg/dL (ref 3.0–4.3)
Potassium: 5.1 mmol/L (ref 3.5–5.2)
Sodium: 140 mmol/L (ref 134–144)
eGFR: 69 mL/min/{1.73_m2} (ref >59)

## 2021-12-09 LAB — LIPID PANEL WITH LDL/HDL RATIO
Cholesterol, Total: 219 mg/dL — ABNORMAL HIGH (ref 100–199)
HDL: 61 mg/dL (ref >39)
LDL Chol Calc (NIH): 138 mg/dL — ABNORMAL HIGH (ref 0–99)
LDL/HDL Ratio: 2.3 ratio (ref 0.0–3.2)
Triglycerides: 111 mg/dL (ref 0–149)
VLDL Cholesterol Cal: 20 mg/dL (ref 5–40)

## 2021-12-16 DIAGNOSIS — H5213 Myopia, bilateral: Secondary | ICD-10-CM | POA: Diagnosis not present

## 2021-12-16 DIAGNOSIS — H524 Presbyopia: Secondary | ICD-10-CM | POA: Diagnosis not present

## 2021-12-16 DIAGNOSIS — H52203 Unspecified astigmatism, bilateral: Secondary | ICD-10-CM | POA: Diagnosis not present

## 2021-12-16 DIAGNOSIS — H5000 Unspecified esotropia: Secondary | ICD-10-CM | POA: Diagnosis not present

## 2021-12-16 DIAGNOSIS — Z9889 Other specified postprocedural states: Secondary | ICD-10-CM | POA: Diagnosis not present

## 2021-12-16 DIAGNOSIS — G629 Polyneuropathy, unspecified: Secondary | ICD-10-CM | POA: Diagnosis not present

## 2021-12-16 DIAGNOSIS — R9089 Other abnormal findings on diagnostic imaging of central nervous system: Secondary | ICD-10-CM | POA: Diagnosis not present

## 2022-01-17 ENCOUNTER — Encounter: Payer: Self-pay | Admitting: Obstetrics & Gynecology

## 2022-01-17 ENCOUNTER — Ambulatory Visit (INDEPENDENT_AMBULATORY_CARE_PROVIDER_SITE_OTHER): Payer: Medicare Other | Admitting: Obstetrics & Gynecology

## 2022-01-17 VITALS — BP 122/80 | Ht 65.0 in | Wt 149.0 lb

## 2022-01-17 DIAGNOSIS — N94818 Other vulvodynia: Secondary | ICD-10-CM | POA: Diagnosis not present

## 2022-01-17 DIAGNOSIS — N905 Atrophy of vulva: Secondary | ICD-10-CM | POA: Diagnosis not present

## 2022-01-17 MED ORDER — ESTRADIOL 0.1 MG/GM VA CREA: TOPICAL_CREAM | VAGINAL | 12 refills | Status: DC

## 2022-01-17 NOTE — Progress Notes (Signed)
   Subjective:    Patient ID: Melissa Daniels, female    DOB: February 22, 1946, 76 y.o.   MRN: [id number]  HPI 76 yo widowed P3 is here as a new patient with the issue of vulvar discomfort. She has tried three times per day vagisil and prescription of clotrimazole/betamethasone with no relief. She has been abstinent for years. She has a h/o breast cancer and ITP.   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    Objective:   Physical Exam  Well nourished, well hydrated White female, no apparent distress She is ambulating and conversing normally. Vulva- marked vulvovaginal atrophy Urethral caruncle noted Pederson spec used to visualize the vagina- only expected atrophy noted No discharge Bimanual reveals no masses or pain      Assessment & Plan:   Symptomatic vulvovaginal atrophy I have prescribed estradiol cream 1 gram per vulva twice nightly. I gave her samples of Imvexxy to see if she prefers tablets instead of cream. She will notify the office of her choice.

## 2022-01-18 DIAGNOSIS — L821 Other seborrheic keratosis: Secondary | ICD-10-CM | POA: Diagnosis not present

## 2022-01-18 DIAGNOSIS — C44311 Basal cell carcinoma of skin of nose: Secondary | ICD-10-CM | POA: Diagnosis not present

## 2022-01-18 DIAGNOSIS — D485 Neoplasm of uncertain behavior of skin: Secondary | ICD-10-CM | POA: Diagnosis not present

## 2022-01-18 DIAGNOSIS — Z872 Personal history of diseases of the skin and subcutaneous tissue: Secondary | ICD-10-CM | POA: Diagnosis not present

## 2022-01-18 DIAGNOSIS — L578 Other skin changes due to chronic exposure to nonionizing radiation: Secondary | ICD-10-CM | POA: Diagnosis not present

## 2022-01-18 DIAGNOSIS — Z859 Personal history of malignant neoplasm, unspecified: Secondary | ICD-10-CM | POA: Diagnosis not present

## 2022-01-26 ENCOUNTER — Other Ambulatory Visit: Payer: Self-pay | Admitting: Advanced Practice Midwife

## 2022-01-26 ENCOUNTER — Telehealth: Payer: Self-pay

## 2022-01-26 NOTE — Telephone Encounter (Signed)
Pt calling for rx of Imvexxy 56mg to be sent to [facility] in [place]  Liked the samples.  [phone]

## 2022-01-27 ENCOUNTER — Telehealth: Payer: Self-pay

## 2022-01-27 ENCOUNTER — Other Ambulatory Visit: Payer: Self-pay | Admitting: Obstetrics and Gynecology

## 2022-01-27 DIAGNOSIS — N905 Atrophy of vulva: Secondary | ICD-10-CM

## 2022-01-27 MED ORDER — IMVEXXY MAINTENANCE PACK 10 MCG VA INST: 10.0000 ug | VAGINAL_INSERT | VAGINAL | 11 refills | Status: DC

## 2022-01-27 NOTE — Telephone Encounter (Signed)
Rx imvexxy eRxd. CMA to notify pt.

## 2022-01-27 NOTE — Progress Notes (Signed)
Rx imvexxy. Pt liked samples, wants Rx. Was given estradiol crm and samples by Dr. Hulan Fray.

## 2022-01-27 NOTE — Telephone Encounter (Signed)
It looks like [facility] sent it as "no print" so Rx never went anywhere. In reviewing chart, pt with hx of breast cancer, ER+. I want [facility] to confirm she is ok to have vag ERT because I'm not comfortable prescribing it. Pt can wait till she returns. Pls notify pt.

## 2022-01-27 NOTE — Telephone Encounter (Signed)
I apologize, I didn't see the message from yesterday/realize what was going on. Melissa Daniels[facility] does not have the rx for cream. Once we d/c it becomes inactive at pharmacy. Could you please resend.

## 2022-01-27 NOTE — Telephone Encounter (Signed)
Patient aware rx for Estradiol Cream was not sent to pharmacy. Will wait for Dr. Hulan Fray to return and review chart to verify rx.

## 2022-01-27 NOTE — Telephone Encounter (Signed)
Pt needs to get the cream Rx filled then and use 1 g 1-2 times weekly for sx.

## 2022-01-27 NOTE — Telephone Encounter (Signed)
She will need a rx for cream. [facility] rx'd Imvexxy.

## 2022-01-27 NOTE — Telephone Encounter (Signed)
TRI[facility] VOICEMAIL: Patient seen 01/17/22 by Dr. Hulan Fray and rx'd: Estradiol [facility]) [place] She reports she can't do this. Her pharmacy can not get the brand and the generic is $195. Patient requesting something else. She is willing to use cream or another brand if needed. [phone] or [phone].

## 2022-01-27 NOTE — Telephone Encounter (Signed)
[facility] prescribed vag crm, I d/c'd it in chart but she sent it at her appt. I sent the imvexxy.

## 2022-01-27 NOTE — Telephone Encounter (Signed)
Melissa Harman- Please contact this patient and ask her to continue with the Estradiol cream for now. I would then leave a note for Dr. Hulan Fray, unless you want to refer this issue to another MD. I will also ask Elmo Putt. I am not knowledgeable about this particular medication.

## 2022-01-31 ENCOUNTER — Other Ambulatory Visit: Payer: Self-pay | Admitting: Obstetrics and Gynecology

## 2022-01-31 DIAGNOSIS — N905 Atrophy of vulva: Secondary | ICD-10-CM

## 2022-01-31 MED ORDER — IMVEXXY MAINTENANCE PACK 10 MCG VA INST: 10.0000 ug | VAGINAL_INSERT | VAGINAL | 1 refills | Status: DC

## 2022-02-01 ENCOUNTER — Telehealth: Payer: Self-pay | Admitting: Obstetrics and Gynecology

## 2022-02-01 NOTE — Telephone Encounter (Signed)
Left msg Dr. Amalia Hailey has sent in rx.

## 2022-02-01 NOTE — Telephone Encounter (Signed)
Spoke with pharmacy. Advised to discontinue Imvexxy prescription as it was not affordable for the patient. Dr. Hulan Fray is out of the office this week. We will await her return to determine alternate course of treatment per Melissa Daniels, PAC instructions. (See separate telephone encounter)

## 2022-02-01 NOTE — Telephone Encounter (Addendum)
Can she use Vagisil c br ca hx. (If calling pt n the 6th, call her cell phone)

## 2022-02-01 NOTE — Telephone Encounter (Signed)
Pt returning my call from this am as I was f/u on my calls; I left her a msg that Dr Amalia Hailey had sent in her rx.  As it turns out it is too expensive for the pt at $47/m.  Pt also needs something in place of Imvexxy that is not contraindicated in breast Cancer pt.  Adv pt Dr. Hulan Fray is not in the office until Mon; pt requested msg be sent to someone so she doesn't have to wait another week.  In the meantime, she will use the samples of imvexxy she was given.

## 2022-02-01 NOTE — Telephone Encounter (Addendum)
Given breast cancer history, she can use coconut oil (one sold in oil section at grocery store) for vaginal moisturizer. Can freeze a teaspoon dose and insert vaginally or just apply to outside. She can use hyaluronic acid vaginal suppositories (can order online) nightly or every few nights for vaginal moisturizing. Imvexxy and vaginal estrogen are contra indicated for women with hx of breast cancer. If still seen by oncology, she can see if they're comfortable with her having vag ERT. Some onc MDs are ok with this....but this is not in the safety literature just used.   Pt also needs to make sure she's not using anything externally that is irritating. Use dove sensitive skin soap, no dryer sheets in underwear, wear cotton underwear, minimize pad use, no external wipes, no bubble baths.

## 2022-02-01 NOTE — Telephone Encounter (Signed)
Pharmacy called and states they cannot fill this RX due to not being able to get it wholesale. Pharmacy states that they have informed the patient of this information. Pharmacy is inquiring if provider wanted to switch the medication to an alternative or if he wanted to send RX to a different pharmacy. Please advise.

## 2022-02-02 NOTE — Telephone Encounter (Signed)
Vagisil is safe but needs to be unscented/sensitive skin. Chemicals/scented products can worsen vaginal irritation.

## 2022-02-03 NOTE — Telephone Encounter (Signed)
Pt aware.

## 2022-02-08 DIAGNOSIS — C4491 Basal cell carcinoma of skin, unspecified: Secondary | ICD-10-CM | POA: Diagnosis not present

## 2022-02-08 DIAGNOSIS — C44311 Basal cell carcinoma of skin of nose: Secondary | ICD-10-CM | POA: Diagnosis not present

## 2022-03-16 ENCOUNTER — Inpatient Hospital Stay
Admission: RE | Admit: 2022-03-16 | Discharge: 2022-03-16 | Disposition: A | Discharge status: Home / Self Care | Payer: Self-pay | Source: Ambulatory Visit | Attending: *Deleted | Admitting: *Deleted

## 2022-03-16 ENCOUNTER — Other Ambulatory Visit: Payer: Self-pay | Admitting: *Deleted

## 2022-03-16 DIAGNOSIS — Z1231 Encounter for screening mammogram for malignant neoplasm of breast: Secondary | ICD-10-CM

## 2022-03-21 ENCOUNTER — Other Ambulatory Visit: Payer: Self-pay

## 2022-03-21 ENCOUNTER — Telehealth: Payer: Self-pay | Admitting: Family Medicine

## 2022-03-21 DIAGNOSIS — Z1231 Encounter for screening mammogram for malignant neoplasm of breast: Secondary | ICD-10-CM

## 2022-03-21 NOTE — Progress Notes (Signed)
Ordered mammo 

## 2022-03-21 NOTE — Telephone Encounter (Signed)
Referral Request - Has patient seen PCP for this complaint? yes *If NO, is insurance requiring patient see PCP for this issue before PCP can refer them? Referral for which specialty: mammogram Preferred provider/office: wants to go to [place] instead of [facility] Reason for referral: dx exam

## 2022-03-22 ENCOUNTER — Other Ambulatory Visit: Payer: Self-pay | Admitting: Family Medicine

## 2022-03-22 ENCOUNTER — Telehealth: Payer: Self-pay | Admitting: Family Medicine

## 2022-03-22 ENCOUNTER — Telehealth: Payer: Self-pay

## 2022-03-22 DIAGNOSIS — Z853 Personal history of malignant neoplasm of breast: Secondary | ICD-10-CM

## 2022-03-22 NOTE — Telephone Encounter (Signed)
Spoke to pt concerning mammo. [facility] is under a 5 year protocol and [facility] does 2 years if you have had breast cancer. If pt wants diagnostic bilateral mammo, we can order it AFTER breast exam in office. BUT no guarantee ins. Will pay for it. Pt didn't give me a definite answer as to what she wants to do, but told me thank you for  "trying to explain it" to her. The order for a screening bilateral mammo at [facility] was placed already.

## 2022-03-22 NOTE — Telephone Encounter (Signed)
Copied from [place] [phone]. Topic: Referral - Status >> Mar 22, 2022 11:19 AM Cyndi Bender wrote: Reason for CRM: Pt reports that an order for mammogram was sent to Dr. Ronnald Ramp from [facility] and it needs to be signed and returned so she can be scheduled

## 2022-03-22 NOTE — Telephone Encounter (Signed)
Told pt that if she wanted a diagnostic mammogram ordered that she would need to come into the office for a breast exam. Pt stated she would go to [place] to get her mammogram done.   KP

## 2022-03-31 ENCOUNTER — Other Ambulatory Visit: Payer: Self-pay | Admitting: Family Medicine

## 2022-03-31 DIAGNOSIS — F410 Panic disorder [episodic paroxysmal anxiety] without agoraphobia: Secondary | ICD-10-CM

## 2022-03-31 NOTE — Telephone Encounter (Signed)
Requested medication (s) are due for refill today:   Provider to review  Requested medication (s) are on the active medication list:   Yes  Future visit scheduled:   Yes   Last ordered: 06/10/2021 #20, 0 refills  Non delegated refill reason returned   Requested Prescriptions  Pending Prescriptions Disp Refills   ALPRAZolam (XANAX) 0.25 MG tablet [Pharmacy Med Name: ALPRAZOLAM 0.25 MG TAB] 30 tablet     Sig: TAKE (1) TABLET BY MOUTH EVERY DAY AS NEEDED     Not Delegated - Psychiatry: Anxiolytics/Hypnotics 2 Failed - 03/31/2022 12:01 PM      Failed - This refill cannot be delegated      Failed - Urine Drug Screen completed in last 360 days      Passed - Patient is not pregnant      Passed - Valid encounter within last 6 months    Recent Outpatient Visits           3 months ago Familial multiple lipoprotein-type hyperlipidemia   [facility] [place], [place], MD   9 months ago Vulvar dermatitis   [facility] [place], Deanna C, MD   9 months ago Familial multiple lipoprotein-type hyperlipidemia   [facility] [place], Deanna C, MD   1 year ago Primary osteoarthritis of right knee   [facility] [place], Earley Abide, MD   1 year ago Post-traumatic osteoarthritis of both knees   [facility] [place], [place], MD       Future Appointments             In 2 months Juline Patch, MD [facility], [facility]

## 2022-05-10 ENCOUNTER — Ambulatory Visit (INDEPENDENT_AMBULATORY_CARE_PROVIDER_SITE_OTHER): Payer: Medicare Other | Admitting: Family Medicine

## 2022-05-10 ENCOUNTER — Encounter: Payer: Self-pay | Admitting: Family Medicine

## 2022-05-10 VITALS — BP 124/70 | HR 97 | Temp 98.5°F | Ht 65.0 in | Wt 154.0 lb

## 2022-05-10 DIAGNOSIS — N3000 Acute cystitis without hematuria: Secondary | ICD-10-CM

## 2022-05-10 DIAGNOSIS — R6883 Chills (without fever): Secondary | ICD-10-CM | POA: Diagnosis not present

## 2022-05-10 LAB — POCT URINALYSIS DIPSTICK
Bilirubin, UA: NEGATIVE
Blood, UA: NEGATIVE
Glucose, UA: NEGATIVE
Ketones, UA: NEGATIVE
Nitrite, UA: POSITIVE
Protein, UA: NEGATIVE
Spec Grav, UA: 1.02 (ref 1.010–1.025)
Urobilinogen, UA: 0.2 E.U./dL
pH, UA: 7 (ref 5.0–8.0)

## 2022-05-10 LAB — POCT INFLUENZA A/B
Influenza A, POC: NEGATIVE
Influenza B, POC: NEGATIVE

## 2022-05-10 LAB — POC COVID19 BINAXNOW: SARS Coronavirus 2 Ag: NEGATIVE

## 2022-05-10 MED ORDER — CEPHALEXIN 500 MG PO CAPS: 500.0000 mg | ORAL_CAPSULE | Freq: Three times a day (TID) | ORAL | 0 refills | Status: DC

## 2022-05-10 NOTE — Progress Notes (Signed)
Date:  05/10/2022   Name:  Melissa Daniels   DOB:  [birth year]   MRN:  [id number]   Chief Complaint: Chills (No body aches, weak, no fever- started Saturday. Also having a "shooting pain going up neck")  Fever  Chronicity: nonfebrile however. The current episode started in the past 7 days. The problem has been gradually improving (today first day improvement). Associated symptoms include headaches. Pertinent negatives include no chest pain, congestion, coughing, diarrhea, ear pain, muscle aches, nausea, sore throat or wheezing. She has tried fluids for the symptoms. The treatment provided mild relief.    Lab Results  Component Value Date   NA 140 12/08/2021   K 5.1 12/08/2021   CO2 25 12/08/2021   GLUCOSE 97 12/08/2021   BUN 16 12/08/2021   CREATININE 0.87 12/08/2021   CALCIUM 9.5 12/08/2021   EGFR 69 12/08/2021   GFRNONAA 74 06/03/2020   Lab Results  Component Value Date   CHOL 219 (H) 12/08/2021   HDL 61 12/08/2021   LDLCALC 138 (H) 12/08/2021   TRIG 111 12/08/2021   CHOLHDL 3.5 11/27/2018   No results found for: "TSH" No results found for: "HGBA1C" Lab Results  Component Value Date   WBC 4.8 12/08/2021   HGB 13.2 12/08/2021   HCT 38.7 12/08/2021   MCV 91 12/08/2021   PLT 267 12/08/2021   Lab Results  Component Value Date   ALT 19 06/10/2021   AST 26 06/10/2021   ALKPHOS 85 06/10/2021   BILITOT 0.6 06/10/2021   No results found for: "25OHVITD2", "25OHVITD3", "VD25OH"   Review of Systems  Constitutional:  Positive for chills, fatigue and fever. Negative for diaphoresis.  HENT:  Positive for postnasal drip. Negative for congestion, ear pain, sinus pressure, sneezing and sore throat.   Respiratory:  Negative for cough and wheezing.   Cardiovascular:  Negative for chest pain and palpitations.  Gastrointestinal:  Negative for diarrhea and nausea.  Genitourinary:  Positive for frequency and urgency.  Neurological:  Positive for headaches.    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   Dry eye syndrome of both eyes 08/06/2020   Binocular vision disorder with diplopia 08/06/2020   Sixth (abducent) nerve palsy, 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   Esotropia 03/26/2018   Abscess of finger of right hand    Abscess of left forearm    Cellulitis 10/21/2017   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   Personal history of colonic polyps    Benign neoplasm of cecum    Benign neoplasm of transverse colon    Benign neoplasm of ascending colon    Malignant neoplasm of upper-outer quadrant of left breast in female, estrogen receptor positive ([place]) 01/10/2017   Neuropathy 10/31/2016   Taking medication for chronic disease 10/31/2016   Primary osteoarthritis of right hip 10/31/2016   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[place]) 10/19/2015   Menopause 10/19/2015   Hormone replacement therapy (HRT) 10/19/2015   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   Routine general medical examination at a health care facility 10/10/2014   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   Female stress incontinence 10/10/2014   Colon polyp 10/10/2014   Episodic paroxysmal anxiety disorder 10/10/2014    Allergies  Allergen Reactions   Aspirin    Macrobid [Nitrofurantoin]    Nsaids Other (See Comments)    History of ITP, 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, should not  receive platelet toxic agents    Sulfa Antibiotics Other (See Comments)    Platelets drop     Past Surgical History:  Procedure Laterality Date   BREAST BIOPSY Left 12/28/2016   stereo path pend   BREAST LUMPECTOMY     COLONOSCOPY WITH PROPOFOL N/A 09/08/2017   Procedure: COLONOSCOPY WITH PROPOFOL;  Surgeon: Lucilla Lame, MD;  Location: [facility];  Service: Endoscopy;  Laterality: N/A;   COLONOSCOPY WITH PROPOFOL N/A 09/03/2020   Procedure:  COLONOSCOPY WITH PROPOFOL;  Surgeon: Lucilla Lame, MD;  Location: [facility];  Service: Endoscopy;  Laterality: N/A;  priority 4   EYE SURGERY     POLYPECTOMY  09/08/2017   Procedure: POLYPECTOMY;  Surgeon: Lucilla Lame, MD;  Location: [facility];  Service: Endoscopy;;   SPLENECTOMY, TOTAL      Social History   Tobacco Use   Smoking status: Never   Smokeless tobacco: Never  Vaping Use   Vaping Use: Never used  Substance Use Topics   Alcohol use: Not Currently   Drug use: Never     Medication list has been reviewed and updated.  Current Meds  Medication Sig   ALPRAZolam (XANAX) 0.25 MG tablet TAKE (1) TABLET BY MOUTH EVERY DAY AS NEEDED   Calcium Citrate-Vitamin D (CALCIUM + D PO) Take 1 tablet by mouth 2 (two) times daily.   Cholecalciferol (VITAMIN D3) 25 MCG (1000 UT) CAPS Take 1 capsule by mouth daily.   DULoxetine (CYMBALTA) 60 MG capsule Take 60 mg by mouth daily.   gabapentin (NEURONTIN) 300 MG capsule Take 1 capsule (300 mg total) by mouth at bedtime. (Patient taking differently: Take 300 mg by mouth in the morning and at bedtime. [facility] doctor- morning and night)   Multiple Vitamins-Minerals (OCUVITE ADULT 50+ PO) Take 1 tablet by mouth in the morning and at bedtime.   oxybutynin (DITROPAN-XL) 5 MG 24 hr tablet TAKE (1) TABLET BY MOUTH DAILY AT BEDTIME   simvastatin (ZOCOR) 20 MG tablet Take 1 tablet (20 mg total) by mouth at bedtime.   vitamin E 400 UNIT capsule Take 400 Units by mouth daily.       05/10/2022    4:45 PM 06/10/2021    8:07 AM 02/22/2021   11:46 AM 01/29/2021   10:17 AM  GAD 7 : Generalized Anxiety Score  Nervous, Anxious, on Edge 0 0 0 0  Control/stop worrying 0 0 0 0  Worry too much - different things 0 0 0 0  Trouble relaxing 0 0 0 0  Restless 0 0 0 0  Easily annoyed or irritable 0 0 0 0  Afraid - awful might happen 0 0 0 0  Total GAD 7 Score 0 0 0 0  Anxiety Difficulty Not difficult at all Not difficult at all Not difficult at  all        05/10/2022    4:45 PM 09/15/2021    1:18 PM 06/10/2021    8:07 AM  Depression screen PHQ 2/9  Decreased Interest 0 0 0  Down, Depressed, Hopeless 0 1 0  PHQ - 2 Score 0 1 0  Altered sleeping 0  0  Tired, decreased energy 0  0  Change in appetite 0  0  Feeling bad or failure about yourself  0  0  Trouble concentrating 0  0  Moving slowly or fidgety/restless 0  0  Suicidal thoughts 0  0  PHQ-9 Score 0  0  Difficult doing work/chores Not difficult at  all  Not difficult at all    BP Readings from Last 3 Encounters:  05/10/22 124/70  01/17/22 122/80  12/08/21 120/78    Physical Exam Vitals and nursing note reviewed. Exam conducted with a chaperone present.  Constitutional:      General: She is not in acute distress.    Appearance: She is not diaphoretic.  HENT:     Head: Normocephalic and atraumatic.     Right Ear: Tympanic membrane and external ear normal.     Left Ear: Tympanic membrane and external ear normal.     Nose: Nose normal.     Mouth/Throat:     Mouth: Mucous membranes are moist.  Eyes:     General:        Right eye: No discharge.        Left eye: No discharge.     Conjunctiva/sclera: Conjunctivae normal.     Pupils: Pupils are equal, round, and reactive to light.  Neck:     Thyroid: No thyromegaly.     Vascular: No JVD.  Cardiovascular:     Rate and Rhythm: Normal rate and regular rhythm.     Heart sounds: Normal heart sounds. No murmur heard.    No friction rub. No gallop.  Pulmonary:     Effort: Pulmonary effort is normal.     Breath sounds: Normal breath sounds. No wheezing, rhonchi or rales.  Abdominal:     General: Bowel sounds are normal.     Palpations: Abdomen is soft. There is no hepatomegaly, splenomegaly or mass.     Tenderness: There is no abdominal tenderness. There is no right CVA tenderness, left CVA tenderness or guarding.  Musculoskeletal:        General: Normal range of motion.     Cervical back: Normal range of motion  and neck supple.  Lymphadenopathy:     Cervical: No cervical adenopathy.  Skin:    General: Skin is warm and dry.  Neurological:     Mental Status: She is alert.     Deep Tendon Reflexes: Reflexes are normal and symmetric.     Wt Readings from Last 3 Encounters:  05/10/22 154 lb (69.9 kg)  01/17/22 149 lb (67.6 kg)  12/08/21 144 lb (65.3 kg)    BP 124/70   Pulse 97   Temp 98.5 F (36.9 C) (Oral)   Ht _0  (1.651 m)   Wt 154 lb (69.9 kg)   SpO2 98%   BMI 25.63 kg/m   Assessment and Plan: 1. Chills (without fever) New onset.  Persistent.  Although I do expect that the patient did have a fever last night and that she had 3 coats that she was covered up with with chills I think that it probably was unrecognized that there was not under Fever as well given the circumstances we checked a COVID and a influenza and these were negative.  Auscultation of the lungs were unremarkable. - POCT Influenza A/B - POC COVID-19  2. Acute cystitis without hematuria On further questioning patient has had frequency but it is better today however despite no tenderness over the CVA aspects or suprapubic I do have concerns and we will check a urine.  Urine dipstick notes that there is leukocytes consistent with a urinary tract infection and we will send urine for culture and we will initiate cephalexin 500 mg 3 times a day for 7 days. - POCT urinalysis dipstick - Urine Culture - cephALEXin (KEFLEX) 500 MG capsule; Take 1  capsule (500 mg total) by mouth 3 (three) times daily.  Dispense: 21 capsule; Refill: 0     Otilio Miu, MD

## 2022-05-12 LAB — URINE CULTURE

## 2022-05-13 ENCOUNTER — Ambulatory Visit
Admission: RE | Admit: 2022-05-13 | Discharge: 2022-05-13 | Disposition: A | Discharge status: Home / Self Care | Payer: Medicare Other | Attending: Family Medicine | Admitting: Family Medicine

## 2022-05-13 ENCOUNTER — Ambulatory Visit
Admission: RE | Admit: 2022-05-13 | Discharge: 2022-05-13 | Disposition: A | Discharge status: Home / Self Care | Payer: Medicare Other | Source: Ambulatory Visit | Attending: Family Medicine | Admitting: Family Medicine

## 2022-05-13 ENCOUNTER — Other Ambulatory Visit: Payer: Self-pay

## 2022-05-13 DIAGNOSIS — R519 Headache, unspecified: Secondary | ICD-10-CM

## 2022-05-13 NOTE — Progress Notes (Signed)
PC to pt, discussed results, pt voiced understanding.

## 2022-05-19 DIAGNOSIS — Z800 Family history of malignant neoplasm of digestive organs: Secondary | ICD-10-CM | POA: Diagnosis not present

## 2022-05-19 DIAGNOSIS — Z882 Allergy status to sulfonamides status: Secondary | ICD-10-CM | POA: Diagnosis not present

## 2022-05-19 DIAGNOSIS — C50412 Malignant neoplasm of upper-outer quadrant of left female breast: Secondary | ICD-10-CM | POA: Diagnosis not present

## 2022-05-19 DIAGNOSIS — R0602 Shortness of breath: Secondary | ICD-10-CM | POA: Diagnosis not present

## 2022-05-19 DIAGNOSIS — Z853 Personal history of malignant neoplasm of breast: Secondary | ICD-10-CM | POA: Diagnosis not present

## 2022-05-19 DIAGNOSIS — Z1231 Encounter for screening mammogram for malignant neoplasm of breast: Secondary | ICD-10-CM | POA: Diagnosis not present

## 2022-05-19 DIAGNOSIS — Z170 Estrogen receptor positive status [ER+]: Secondary | ICD-10-CM | POA: Diagnosis not present

## 2022-06-13 ENCOUNTER — Ambulatory Visit: Payer: Medicare Other | Admitting: Family Medicine

## 2022-06-13 ENCOUNTER — Other Ambulatory Visit
Admission: RE | Admit: 2022-06-13 | Discharge: 2022-06-13 | Disposition: A | Discharge status: Home / Self Care | Payer: Medicare Other | Attending: Family Medicine | Admitting: Family Medicine

## 2022-06-13 ENCOUNTER — Encounter: Payer: Self-pay | Admitting: Family Medicine

## 2022-06-13 VITALS — BP 124/70 | HR 72 | Ht 65.0 in | Wt 151.0 lb

## 2022-06-13 DIAGNOSIS — D473 Essential (hemorrhagic) thrombocythemia: Secondary | ICD-10-CM | POA: Insufficient documentation

## 2022-06-13 DIAGNOSIS — E7849 Other hyperlipidemia: Secondary | ICD-10-CM

## 2022-06-13 DIAGNOSIS — N393 Stress incontinence (female) (male): Secondary | ICD-10-CM | POA: Diagnosis not present

## 2022-06-13 DIAGNOSIS — E785 Hyperlipidemia, unspecified: Secondary | ICD-10-CM | POA: Insufficient documentation

## 2022-06-13 DIAGNOSIS — D693 Immune thrombocytopenic purpura: Secondary | ICD-10-CM | POA: Diagnosis not present

## 2022-06-13 DIAGNOSIS — R69 Illness, unspecified: Secondary | ICD-10-CM | POA: Diagnosis not present

## 2022-06-13 LAB — CBC WITH DIFFERENTIAL/PLATELET
Abs Immature Granulocytes: 0.02 10*3/uL (ref 0.00–0.07)
Basophils Absolute: 0.1 10*3/uL (ref 0.0–0.1)
Basophils Relative: 1 %
Eosinophils Absolute: 0.3 10*3/uL (ref 0.0–0.5)
Eosinophils Relative: 5 %
HCT: 39.5 % (ref 36.0–46.0)
Hemoglobin: 13 g/dL (ref 12.0–15.0)
Immature Granulocytes: 0 %
Lymphocytes Relative: 38 %
Lymphs Abs: 2.2 10*3/uL (ref 0.7–4.0)
MCH: 31 pg (ref 26.0–34.0)
MCHC: 32.9 g/dL (ref 30.0–36.0)
MCV: 94 fL (ref 80.0–100.0)
Monocytes Absolute: 0.8 10*3/uL (ref 0.1–1.0)
Monocytes Relative: 14 %
Neutro Abs: 2.5 10*3/uL (ref 1.7–7.7)
Neutrophils Relative %: 42 %
Platelets: 243 10*3/uL (ref 150–400)
RBC: 4.2 MIL/uL (ref 3.87–5.11)
RDW: 14.6 % (ref 11.5–15.5)
WBC: 5.8 10*3/uL (ref 4.0–10.5)
nRBC: 0 % (ref 0.0–0.2)

## 2022-06-13 LAB — COMPREHENSIVE METABOLIC PANEL
ALT: 21 U/L (ref 0–44)
AST: 27 U/L (ref 15–41)
Albumin: 4 g/dL (ref 3.5–5.0)
Alkaline Phosphatase: 84 U/L (ref 38–126)
Anion gap: 5 (ref 5–15)
BUN: 17 mg/dL (ref 8–23)
CO2: 31 mmol/L (ref 22–32)
Calcium: 9.3 mg/dL (ref 8.9–10.3)
Chloride: 102 mmol/L (ref 98–111)
Creatinine, Ser: 0.71 mg/dL (ref 0.44–1.00)
GFR, Estimated: >60 mL/min (ref >60)
Glucose, Bld: 99 mg/dL (ref 70–99)
Potassium: 4.2 mmol/L (ref 3.5–5.1)
Sodium: 138 mmol/L (ref 135–145)
Total Bilirubin: 0.8 mg/dL (ref 0.3–1.2)
Total Protein: 7.6 g/dL (ref 6.5–8.1)

## 2022-06-13 LAB — LIPID PANEL
Cholesterol: 261 mg/dL — ABNORMAL HIGH (ref 0–200)
HDL: 63 mg/dL (ref >40)
LDL Cholesterol: 166 mg/dL — ABNORMAL HIGH (ref 0–99)
Total CHOL/HDL Ratio: 4.1 RATIO
Triglycerides: 159 mg/dL — ABNORMAL HIGH (ref <150)
VLDL: 32 mg/dL (ref 0–40)

## 2022-06-13 MED ORDER — OXYBUTYNIN CHLORIDE ER 5 MG PO TB24: ORAL_TABLET | ORAL | 1 refills | Status: DC

## 2022-06-13 MED ORDER — SIMVASTATIN 20 MG PO TABS: 20.0000 mg | ORAL_TABLET | Freq: Every day | ORAL | 1 refills | Status: DC

## 2022-06-13 NOTE — Progress Notes (Signed)
Date:  06/13/2022   Name:  Melissa Daniels   DOB:  04-Aug-1945   MRN:  [id number]   Chief Complaint: overactive bladder and Hyperlipidemia  Hyperlipidemia This is a chronic problem. The current episode started more than 1 year ago. The problem is controlled. Recent lipid tests were reviewed and are normal. She has no history of chronic renal disease, hypothyroidism or obesity. Pertinent negatives include no chest pain, focal sensory loss, focal weakness, leg pain, myalgias or shortness of breath. The current treatment provides moderate improvement of lipids. There are no compliance problems.  Risk factors for coronary artery disease include dyslipidemia.  Urinary Frequency  This is a chronic (overactive bladder) problem. The current episode started more than 1 year ago. The problem occurs intermittently. The problem has been waxing and waning. The pain is mild. Associated symptoms include frequency. Pertinent negatives include no chills, discharge, flank pain, hematuria, hesitancy, nausea, sweats, urgency or vomiting. She has tried nothing for the symptoms. The treatment provided moderate relief. There is no history of catheterization, kidney stones, recurrent UTIs, a single kidney, urinary stasis or a urological procedure.    Lab Results  Component Value Date   NA 140 12/08/2021   K 5.1 12/08/2021   CO2 25 12/08/2021   GLUCOSE 97 12/08/2021   BUN 16 12/08/2021   CREATININE 0.87 12/08/2021   CALCIUM 9.5 12/08/2021   EGFR 69 12/08/2021   GFRNONAA 74 06/03/2020   Lab Results  Component Value Date   CHOL 219 (H) 12/08/2021   HDL 61 12/08/2021   LDLCALC 138 (H) 12/08/2021   TRIG 111 12/08/2021   CHOLHDL 3.5 11/27/2018   No results found for: "TSH" No results found for: "HGBA1C" Lab Results  Component Value Date   WBC 4.8 12/08/2021   HGB 13.2 12/08/2021   HCT 38.7 12/08/2021   MCV 91 12/08/2021   PLT 267 12/08/2021   Lab Results  Component Value Date   ALT 19 06/10/2021   AST  26 06/10/2021   ALKPHOS 85 06/10/2021   BILITOT 0.6 06/10/2021   No results found for: "25OHVITD2", "25OHVITD3", "VD25OH"   Review of Systems  Constitutional:  Negative for chills.  Respiratory:  Negative for cough, chest tightness, shortness of breath and wheezing.   Cardiovascular:  Negative for chest pain.  Gastrointestinal:  Negative for blood in stool, diarrhea, nausea and vomiting.  Genitourinary:  Positive for frequency. Negative for flank pain, hematuria, hesitancy and urgency.  Musculoskeletal:  Negative for myalgias.  Neurological:  Negative for focal weakness.    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   Personal history of colonic polyps    Benign neoplasm of cecum    Benign neoplasm of transverse colon    Benign neoplasm of ascending colon    Malignant neoplasm of upper-outer quadrant of left breast in female, estrogen receptor positive ([place]) 01/10/2017   Neuropathy 10/31/2016   Taking medication for chronic disease 10/31/2016   Primary osteoarthritis of right hip 10/31/2016   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[place]) 10/19/2015   Menopause 10/19/2015  Hormone replacement therapy (HRT) 10/19/2015   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   Routine general medical examination at a health care facility 10/10/2014   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   Female stress incontinence 10/10/2014   Colon polyp 10/10/2014   Episodic paroxysmal anxiety disorder 10/10/2014    Allergies  Allergen Reactions   Aspirin    Macrobid  [Nitrofurantoin]    Nsaids Other (See Comments)    History of ITP, 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, 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    Sulfa Antibiotics Other (See Comments)    Platelets drop     Past Surgical History:  Procedure Laterality Date   BREAST BIOPSY Left 12/28/2016   stereo path pend   BREAST LUMPECTOMY     COLONOSCOPY WITH PROPOFOL N/A 09/08/2017   Procedure: COLONOSCOPY WITH PROPOFOL;  Surgeon: Lucilla Lame, MD;  Location: [facility];  Service: Endoscopy;  Laterality: N/A;   COLONOSCOPY WITH PROPOFOL N/A 09/03/2020   Procedure: COLONOSCOPY WITH PROPOFOL;  Surgeon: Lucilla Lame, MD;  Location: [facility];  Service: Endoscopy;  Laterality: N/A;  priority 4   EYE SURGERY     POLYPECTOMY  09/08/2017   Procedure: POLYPECTOMY;  Surgeon: Lucilla Lame, MD;  Location: [facility];  Service: Endoscopy;;   SPLENECTOMY, TOTAL      Social History   Tobacco Use   Smoking status: Never   Smokeless tobacco: Never  Vaping Use   Vaping Use: Never used  Substance Use Topics   Alcohol use: Not Currently   Drug use: Never     Medication list has been reviewed and updated.  Current Meds  Medication Sig   ALPRAZolam (XANAX) 0.25 MG tablet TAKE (1) TABLET BY MOUTH EVERY DAY AS NEEDED   Calcium Citrate-Vitamin D (CALCIUM + D PO) Take 1 tablet by mouth 2 (two) times daily.   Cholecalciferol (VITAMIN D3) 25 MCG (1000 UT) CAPS Take 1 capsule by mouth daily.   DULoxetine (CYMBALTA) 60 MG capsule Take 60 mg by mouth daily.   gabapentin (NEURONTIN) 300 MG capsule Take 1 capsule (300 mg total) by mouth at bedtime. (Patient taking differently: Take 300 mg by mouth in the morning and at bedtime. [facility] doctor- morning and night)   Multiple Vitamins-Minerals (OCUVITE ADULT 50+ PO) Take 1 tablet by mouth in the morning and at bedtime.   oxybutynin (DITROPAN-XL) 5 MG 24 hr tablet TAKE (1) TABLET BY MOUTH DAILY AT BEDTIME   simvastatin  (ZOCOR) 20 MG tablet Take 1 tablet (20 mg total) by mouth at bedtime.   vitamin E 400 UNIT capsule Take 400 Units by mouth daily.       06/13/2022    9:09 AM 05/10/2022    4:45 PM 06/10/2021    8:07 AM 02/22/2021   11:46 AM  GAD 7 : Generalized Anxiety Score  Nervous, Anxious, on Edge 0 0 0 0  Control/stop worrying 0 0 0 0  Worry too much - different things 0 0 0 0  Trouble relaxing 0 0 0 0  Restless 0 0 0 0  Easily annoyed or irritable 0 0 0 0  Afraid - awful might happen 0 0 0 0  Total GAD 7 Score 0 0 0 0  Anxiety Difficulty Not difficult at all Not difficult at all Not difficult at all Not difficult at all       06/13/2022    9:09 AM 05/10/2022    4:45 PM 09/15/2021    1:18 PM  Depression screen PHQ 2/9  Decreased Interest 0 0 0  Down, Depressed, Hopeless 0 0 1  PHQ - 2 Score 0 0 1  Altered sleeping 0 0   Tired, decreased energy 0 0   Change in appetite 0 0   Feeling bad or failure about yourself  0 0   Trouble concentrating 0 0   Moving slowly or fidgety/restless 0 0   Suicidal thoughts 0 0   PHQ-9 Score 0 0   Difficult doing work/chores Not difficult at all Not difficult at all     BP Readings from Last 3 Encounters:  06/13/22 124/70  05/10/22 124/70  01/17/22 122/80    Physical Exam Vitals and nursing note reviewed. Exam conducted with a chaperone present.  Constitutional:      General: She is not in acute distress.    Appearance: She is not diaphoretic.  HENT:     Head: Normocephalic and atraumatic.     Right Ear: External ear normal.     Left Ear: External ear normal.     Nose: Nose normal.  Eyes:     General:        Right eye: No discharge.        Left eye: No discharge.     Conjunctiva/sclera: Conjunctivae normal.     Pupils: Pupils are equal, round, and reactive to light.  Neck:     Thyroid: No thyromegaly.     Vascular: No JVD.  Cardiovascular:     Rate and Rhythm: Normal rate and regular rhythm.     Heart sounds: Normal heart sounds, S1  normal and S2 normal. No murmur heard.    No systolic murmur is present.     No diastolic murmur is present.     No friction rub. No gallop. No S3 or S4 sounds.  Pulmonary:     Effort: Pulmonary effort is normal.     Breath sounds: Normal breath sounds.  Abdominal:     General: Bowel sounds are normal.     Palpations: Abdomen is soft. There is no mass.     Tenderness: There is no abdominal tenderness. There is no guarding.  Musculoskeletal:        General: Normal range of motion.     Cervical back: Normal range of motion and neck supple.  Lymphadenopathy:     Cervical: No cervical adenopathy.  Skin:    General: Skin is warm and dry.  Neurological:     Mental Status: She is alert.     Deep Tendon Reflexes: Reflexes are normal and symmetric.     Wt Readings from Last 3 Encounters:  06/13/22 151 lb (68.5 kg)  05/10/22 154 lb (69.9 kg)  01/17/22 149 lb (67.6 kg)    BP 124/70   Pulse 72   Ht '5\' 5"'$  (1.651 m)   Wt 151 lb (68.5 kg)   SpO2 99%   BMI 25.13 kg/m   Assessment and Plan:  1. Familial multiple lipoprotein-type hyperlipidemia Chronic.  Controlled.  Stable.  Will check lipid panel and given the readings will likely continue simvastatin 20 mg once a day.  Will recheck patient in 6 months. - simvastatin (ZOCOR) 20 MG tablet; Take 1 tablet (20 mg total) by mouth at bedtime.  Dispense: 90 tablet; Refill: 1 - Lipid Panel With LDL/HDL Ratio  2. 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  Controlled.  Stable.  Patient has had some dry mouth and she is going to try different taking or every other day  to see if she has controlled plus decrease in the side effects. - oxybutynin (DITROPAN-XL) 5 MG 24 hr tablet; TAKE (1) TABLET BY MOUTH DAILY AT BEDTIME  Dispense: 90 tablet; Refill: 1  3. 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  Check CBC with differential platelet. - CBC w/Diff/Platelet  4. Taking medication for chronic disease Chronic.   Controlled.  Stable.  Will check CMP. - Comprehensive Metabolic Panel (CMET)    Otilio Miu, MD

## 2022-06-15 ENCOUNTER — Other Ambulatory Visit: Payer: Self-pay | Admitting: *Deleted

## 2022-06-15 DIAGNOSIS — F410 Panic disorder [episodic paroxysmal anxiety] without agoraphobia: Secondary | ICD-10-CM

## 2022-06-15 NOTE — Telephone Encounter (Signed)
Requested medication (s) are due for refill today: yes  Requested medication (s) are on the active medication list: yes  Last refill:  11/2.23 #30 0 refills  Future visit scheduled: yes in 6 months  Notes to clinic:  not delegated per protocol. Do you want to refill Rx?     Requested Prescriptions  Pending Prescriptions Disp Refills   ALPRAZolam (XANAX) 0.25 MG tablet 30 tablet 0     Not Delegated - Psychiatry: Anxiolytics/Hypnotics 2 Failed - 06/15/2022  3:10 PM      Failed - This refill cannot be delegated      Failed - Urine Drug Screen completed in last 360 days      Passed - Patient is not pregnant      Passed - Valid encounter within last 6 months    Recent Outpatient Visits           2 days ago Familial multiple lipoprotein-type hyperlipidemia   [facility] [place], [place], MD   1 month ago Chills (without fever)   [facility] [place], [place], MD   6 months ago Familial multiple lipoprotein-type hyperlipidemia   [facility] [place], [place], MD   11 months ago Vulvar dermatitis   [facility] [place], Deanna C, MD   1 year ago Familial multiple lipoprotein-type hyperlipidemia   [facility] [place], [place], MD       Future Appointments             In 6 months Juline Patch, MD [facility], [facility]

## 2022-06-30 DIAGNOSIS — G629 Polyneuropathy, unspecified: Secondary | ICD-10-CM | POA: Diagnosis not present

## 2022-08-17 DIAGNOSIS — Z859 Personal history of malignant neoplasm, unspecified: Secondary | ICD-10-CM | POA: Diagnosis not present

## 2022-08-17 DIAGNOSIS — Z872 Personal history of diseases of the skin and subcutaneous tissue: Secondary | ICD-10-CM | POA: Diagnosis not present

## 2022-08-17 DIAGNOSIS — L298 Other pruritus: Secondary | ICD-10-CM | POA: Diagnosis not present

## 2022-08-17 DIAGNOSIS — L570 Actinic keratosis: Secondary | ICD-10-CM | POA: Diagnosis not present

## 2022-08-17 DIAGNOSIS — Z85828 Personal history of other malignant neoplasm of skin: Secondary | ICD-10-CM | POA: Diagnosis not present

## 2022-08-17 DIAGNOSIS — L578 Other skin changes due to chronic exposure to nonionizing radiation: Secondary | ICD-10-CM | POA: Diagnosis not present

## 2022-08-17 DIAGNOSIS — L814 Other melanin hyperpigmentation: Secondary | ICD-10-CM | POA: Diagnosis not present

## 2022-08-17 DIAGNOSIS — L821 Other seborrheic keratosis: Secondary | ICD-10-CM | POA: Diagnosis not present

## 2022-10-11 ENCOUNTER — Telehealth: Payer: Self-pay | Admitting: Family Medicine

## 2022-10-11 NOTE — Telephone Encounter (Signed)
Copied from CRM [id number]. Topic: General - Other >> Oct 11, 2022 10:01 AM Everette C wrote: Reason for CRM: The patient would like to speak with a nurse health advisor regarding their upcoming appt   Please contact further when possible

## 2022-10-12 ENCOUNTER — Ambulatory Visit (INDEPENDENT_AMBULATORY_CARE_PROVIDER_SITE_OTHER): Payer: Medicare Other

## 2022-10-12 VITALS — Ht 65.0 in | Wt 151.0 lb

## 2022-10-12 DIAGNOSIS — Z0000 Encounter for general adult medical examination without abnormal findings: Secondary | ICD-10-CM | POA: Diagnosis not present

## 2022-10-12 NOTE — Patient Instructions (Signed)
Melissa Daniels , Thank you for taking time to come for your Medicare Wellness Visit. I appreciate your ongoing commitment to your health goals. Please review the following plan we discussed and let me know if I can assist you in the future.   These are the goals we discussed:  Goals      DIET - EAT MORE FRUITS AND VEGETABLES     DIET - INCREASE WATER INTAKE     Recommend to drink at least 6-8 8oz glasses of water per day.     Weight (lb) < 150 lb (68 kg)     Pt states she would like to lose 5 lbs        This is a list of the screening recommended for you and due dates:  Health Maintenance  Topic Date Due   Mammogram  03/12/2022   Flu Shot  12/29/2022   Colon Cancer Screening  09/04/2023   Medicare Annual Wellness Visit  10/12/2023   DTaP/Tdap/Td vaccine (2 - Td or Tdap) 10/18/2025   Pneumonia Vaccine  Completed   DEXA scan (bone density measurement)  Completed   Hepatitis C Screening: USPSTF Recommendation to screen - Ages 64-79 yo.  Completed   HPV Vaccine  Aged Out   COVID-19 Vaccine  Discontinued   Zoster (Shingles) Vaccine  Discontinued   Meningitis B Vaccine  Discontinued    Advanced directives: yes  Conditions/risks identified: none  Next appointment: Follow up in one year for your annual wellness visit 10/18/23 @ 10:15 am by phone   Preventive Care 65 Years and Older, Female Preventive care refers to lifestyle choices and visits with your health care provider that can promote health and wellness. What does preventive care include? A yearly physical exam. This is also called an annual well check. Dental exams once or twice a year. Routine eye exams. Ask your health care provider how often you should have your eyes checked. Personal lifestyle choices, including: Daily care of your teeth and gums. Regular physical activity. Eating a healthy diet. Avoiding tobacco and drug use. Limiting alcohol use. Practicing safe sex. Taking low-dose aspirin every day. Taking  vitamin and mineral supplements as recommended by your health care provider. What happens during an annual well check? The services and screenings done by your health care provider during your annual well check will depend on your age, overall health, lifestyle risk factors, and family history of disease. Counseling  Your health care provider may ask you questions about your: Alcohol use. Tobacco use. Drug use. Emotional well-being. Home and relationship well-being. Sexual activity. Eating habits. History of falls. Memory and ability to understand (cognition). Work and work Astronomer. Reproductive health. Screening  You may have the following tests or measurements: Height, weight, and BMI. Blood pressure. Lipid and cholesterol levels. These may be checked every 5 years, or more frequently if you are over 58 years old. Skin check. Lung cancer screening. You may have this screening every year starting at age 68 if you have a 30-pack-year history of smoking and currently smoke or have quit within the past 15 years. Fecal occult blood test (FOBT) of the stool. You may have this test every year starting at age 92. Flexible sigmoidoscopy or colonoscopy. You may have a sigmoidoscopy every 5 years or a colonoscopy every 10 years starting at age 58. Hepatitis C blood test. Hepatitis B blood test. Sexually transmitted disease (STD) testing. Diabetes screening. This is done by checking your blood sugar (glucose) after you have not eaten  for a while (fasting). You may have this done every 1-3 years. Bone density scan. This is done to screen for osteoporosis. You may have this done starting at age 62. Mammogram. This may be done every 1-2 years. Talk to your health care provider about how often you should have regular mammograms. Talk with your health care provider about your test results, treatment options, and if necessary, the need for more tests. Vaccines  Your health care provider may  recommend certain vaccines, such as: Influenza vaccine. This is recommended every year. Tetanus, diphtheria, and acellular pertussis (Tdap, Td) vaccine. You may need a Td booster every 10 years. Zoster vaccine. You may need this after age 68. Pneumococcal 13-valent conjugate (PCV13) vaccine. One dose is recommended after age 71. Pneumococcal polysaccharide (PPSV23) vaccine. One dose is recommended after age 28. Talk to your health care provider about which screenings and vaccines you need and how often you need them. This information is not intended to replace advice given to you by your health care provider. Make sure you discuss any questions you have with your health care provider. Document Released: 06/12/2015 Document Revised: 02/03/2016 Document Reviewed: 03/17/2015 Elsevier Interactive Patient Education  2017 [place] Prevention in the Home Falls can cause injuries. They can happen to people of all ages. There are many things you can do to make your home safe and to help prevent falls. What can I do on the outside of my home? Regularly fix the edges of walkways and driveways and fix any cracks. Remove anything that might make you trip as you walk through a door, such as a raised step or threshold. Trim any bushes or trees on the path to your home. Use bright outdoor lighting. Clear any walking paths of anything that might make someone trip, such as rocks or tools. Regularly check to see if handrails are loose or broken. Make sure that both sides of any steps have handrails. Any raised decks and porches should have guardrails on the edges. Have any leaves, snow, or ice cleared regularly. Use sand or salt on walking paths during winter. Clean up any spills in your garage right away. This includes oil or grease spills. What can I do in the bathroom? Use night lights. Install grab bars by the toilet and in the tub and shower. Do not use towel bars as grab bars. Use non-skid  mats or decals in the tub or shower. If you need to sit down in the shower, use a plastic, non-slip stool. Keep the floor dry. Clean up any water that spills on the floor as soon as it happens. Remove soap buildup in the tub or shower regularly. Attach bath mats securely with double-sided non-slip rug tape. Do not have throw rugs and other things on the floor that can make you trip. What can I do in the bedroom? Use night lights. Make sure that you have a light by your bed that is easy to reach. Do not use any sheets or blankets that are too big for your bed. They should not hang down onto the floor. Have a firm chair that has side arms. You can use this for support while you get dressed. Do not have throw rugs and other things on the floor that can make you trip. What can I do in the kitchen? Clean up any spills right away. Avoid walking on wet floors. Keep items that you use a lot in easy-to-reach places. If you need to reach something  above you, use a strong step stool that has a grab bar. Keep electrical cords out of the way. Do not use floor polish or wax that makes floors slippery. If you must use wax, use non-skid floor wax. Do not have throw rugs and other things on the floor that can make you trip. What can I do with my stairs? Do not leave any items on the stairs. Make sure that there are handrails on both sides of the stairs and use them. Fix handrails that are broken or loose. Make sure that handrails are as long as the stairways. Check any carpeting to make sure that it is firmly attached to the stairs. Fix any carpet that is loose or worn. Avoid having throw rugs at the top or bottom of the stairs. If you do have throw rugs, attach them to the floor with carpet tape. Make sure that you have a light switch at the top of the stairs and the bottom of the stairs. If you do not have them, ask someone to add them for you. What else can I do to help prevent falls? Wear shoes  that: Do not have high heels. Have rubber bottoms. Are comfortable and fit you well. Are closed at the toe. Do not wear sandals. If you use a stepladder: Make sure that it is fully opened. Do not climb a closed stepladder. Make sure that both sides of the stepladder are locked into place. Ask someone to hold it for you, if possible. Clearly mark and make sure that you can see: Any grab bars or handrails. First and last steps. Where the edge of each step is. Use tools that help you move around (mobility aids) if they are needed. These include: Canes. Walkers. Scooters. Crutches. Turn on the lights when you go into a dark area. Replace any light bulbs as soon as they burn out. Set up your furniture so you have a clear path. Avoid moving your furniture around. If any of your floors are uneven, fix them. If there are any pets around you, be aware of where they are. Review your medicines with your doctor. Some medicines can make you feel dizzy. This can increase your chance of falling. Ask your doctor what other things that you can do to help prevent falls. This information is not intended to replace advice given to you by your health care provider. Make sure you discuss any questions you have with your health care provider. Document Released: 03/12/2009 Document Revised: 10/22/2015 Document Reviewed: 06/20/2014 Elsevier Interactive Patient Education  2017 [employer].

## 2022-10-12 NOTE — Progress Notes (Signed)
I connected with  Consepcion Hearing on 10/12/22 by a audio enabled telemedicine application and verified that I am speaking with the correct person using two identifiers.  Patient Location: Home  Provider Location: Office/Clinic  I discussed the limitations of evaluation and management by telemedicine. The patient expressed understanding and agreed to proceed.  Subjective:   Nia Harrelson is a 77 y.o. female who presents for Medicare Annual (Subsequent) preventive examination.  Review of Systems     Cardiac Risk Factors include: advanced age (>43men, >42 women);dyslipidemia     Objective:    There were no vitals filed for this visit. There is no height or weight on file to calculate BMI.     10/12/2022   10:36 AM 09/15/2021    1:19 PM 11/06/2020   10:26 AM 09/11/2020    8:03 AM 09/07/2020    1:35 PM 09/03/2020    7:19 AM 08/28/2019    1:33 PM  Advanced Directives  Does Patient Have a Medical Advance Directive? Yes Yes No Yes Yes Yes Yes  Type of Estate agent of [place];Living will Healthcare Power of [place];Living will  Healthcare Power of [place];Living will Healthcare Power of [place];Living will Healthcare Power of [place];Living will Healthcare Power of [place];Living will  Does patient want to make changes to medical advance directive? No - Patient declined     No - Patient declined   Copy of Healthcare Power of Attorney in Chart? Yes - validated most recent copy scanned in chart (See row information) Yes - validated most recent copy scanned in chart (See row information)   No - copy requested Yes - validated most recent copy scanned in chart (See row information) No - copy requested    Current Medications (verified) Outpatient Encounter Medications as of 10/12/2022  Medication Sig   ALPRAZolam (XANAX) 0.25 MG tablet TAKE (1) TABLET BY MOUTH EVERY DAY AS NEEDED   Calcium Citrate-Vitamin D (CALCIUM + D PO) Take 1 tablet by mouth 2 (two) times daily.    Cholecalciferol (VITAMIN D3) 25 MCG (1000 UT) CAPS Take 1 capsule by mouth daily.   DULoxetine (CYMBALTA) 60 MG capsule Take 60 mg by mouth daily.   gabapentin (NEURONTIN) 300 MG capsule Take 1 capsule (300 mg total) by mouth at bedtime. (Patient taking differently: Take 300 mg by mouth in the morning and at bedtime. [facility] doctor- morning and night)   Multiple Vitamins-Minerals (OCUVITE ADULT 50+ PO) Take 1 tablet by mouth in the morning and at bedtime.   oxybutynin (DITROPAN-XL) 5 MG 24 hr tablet TAKE (1) TABLET BY MOUTH DAILY AT BEDTIME   simvastatin (ZOCOR) 20 MG tablet Take 1 tablet (20 mg total) by mouth at bedtime.   vitamin E 400 UNIT capsule Take 400 Units by mouth daily.   No facility-administered encounter medications on file as of 10/12/2022.    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   Neuropathy    bilateral feet   Osteoporosis    Vertigo    several episodes per year   Past Surgical History:  Procedure Laterality Date   BREAST BIOPSY Left 12/28/2016   stereo path pend   BREAST LUMPECTOMY  COLONOSCOPY WITH PROPOFOL N/A 09/08/2017   Procedure: COLONOSCOPY WITH PROPOFOL;  Surgeon: Midge Minium, MD;  Location: [facility];  Service: Endoscopy;  Laterality: N/A;   COLONOSCOPY WITH PROPOFOL N/A 09/03/2020   Procedure: COLONOSCOPY WITH PROPOFOL;  Surgeon: Midge Minium, MD;  Location: [facility];  Service: Endoscopy;  Laterality: N/A;  priority 4   EYE SURGERY     POLYPECTOMY  09/08/2017   Procedure: POLYPECTOMY;  Surgeon: Midge Minium, MD;  Location: [facility];  Service: Endoscopy;;   SPLENECTOMY, TOTAL     Family  History  Problem Relation Age of Onset   Heart disease Mother    Colon cancer Mother    Hypertension Mother    Heart disease Father    Hypertension Father    Breast cancer Neg Hx    Social History   Socioeconomic History   Marital status: Widowed    Spouse name: Not on file   Number of children: 3   Years of education: 12   Highest education level: High school graduate  Occupational History   Occupation: Retired  Tobacco Use   Smoking status: Never   Smokeless tobacco: Never  Vaping Use   Vaping Use: Never used  Substance and Sexual Activity   Alcohol use: Not Currently   Drug use: Never   Sexual activity: Not Currently    Partners: Male  Other Topics Concern   Not on file  Social History Narrative   Pt lives alone   Social Determinants of Health   Financial Resource Strain: Low Risk  (10/12/2022)   Overall Financial Resource Strain (CARDIA)    Difficulty of Paying Living Expenses: Not hard at all  Food Insecurity: No Food Insecurity (10/12/2022)   Hunger Vital Sign    Worried About Running Out of Food in the Last Year: Never true    Ran Out of Food in the Last Year: Never true  Transportation Needs: No Transportation Needs (10/12/2022)   PRAPARE - Administrator, Civil Service (Medical): No    Lack of Transportation (Non-Medical): No  Physical Activity: Insufficiently Active (10/12/2022)   Exercise Vital Sign    Days of Exercise per Week: 3 days    Minutes of Exercise per Session: 30 min  Stress: No Stress Concern Present (10/12/2022)   [employer] of Occupational Health - Occupational Stress Questionnaire    Feeling of Stress : Not at all  Social Connections: Moderately Isolated (10/12/2022)   Social Connection and Isolation Panel [NHANES]    Frequency of Communication with Friends and Family: More than three times a week    Frequency of Social Gatherings with Friends and Family: Three times a week    Attends Religious Services: More than 4 times  per year    Active Member of Clubs or Organizations: No    Attends Banker Meetings: Never    Marital Status: Widowed    Tobacco Counseling Counseling given: Not Answered   Clinical Intake:  Pre-visit preparation completed: Yes  Pain : No/denies pain     Nutritional Risks: None Diabetes: No  How often do you need to have someone help you when you read instructions, pamphlets, or other written materials from your doctor or pharmacy?: 1 - Never  Diabetic?no  Interpreter Needed?: No  Information entered by :: Kennedy Bucker, LPN   Activities of Daily Living    10/12/2022   10:38 AM  In your present state of health, do you have any difficulty performing the  following activities:  Hearing? 0  Vision? 0  Difficulty concentrating or making decisions? 0  Walking or climbing stairs? 1  Comment balance  Dressing or bathing? 0  Doing errands, shopping? 0  Preparing Food and eating ? N  Using the Toilet? N  In the past six months, have you accidently leaked urine? N  Do you have problems with loss of bowel control? N  Managing your Medications? N  Managing your Finances? N  Housekeeping or managing your Housekeeping? N    Patient Care Team: Duanne Limerick, MD as PCP - General (Family Medicine) Talbert Cage, DO as Consulting Physician (Surgical Oncology) Darrol Angel Helane Rima, MD as Consulting Physician (Oncology) Pa, [facility] as Consulting Physician (Optometry)  Indicate any recent Medical Services you may have received from other than [facility] providers in the past year (date may be approximate).     Assessment:   This is a routine wellness examination for Jean.  Hearing/Vision screen Hearing Screening - Comments:: No aids Vision Screening - Comments:: Wears glasses- Dr.King  Dietary issues and exercise activities discussed: Current Exercise Habits: Home exercise routine, Type of exercise: walking, Time (Minutes): 30,  Frequency (Times/Week): 3, Weekly Exercise (Minutes/Week): 90, Intensity: Mild   Goals Addressed             This Visit's Progress    DIET - EAT MORE FRUITS AND VEGETABLES         Depression Screen    10/12/2022   10:35 AM 06/13/2022    9:09 AM 05/10/2022    4:45 PM 09/15/2021    1:18 PM 06/10/2021    8:07 AM 02/22/2021   11:46 AM 01/29/2021   10:17 AM  PHQ 2/9 Scores  PHQ - 2 Score 0 0 0 1 0 0 0  PHQ- 9 Score 0 0 0  0 0 0    Fall Risk    10/12/2022   10:37 AM 06/13/2022    9:09 AM 05/10/2022    4:45 PM 09/15/2021    1:20 PM 02/22/2021   11:00 AM  Fall Risk   Falls in the past year? 1 0 0 1 1  Number falls in past yr: 1 0 0 0 0  Injury with Fall? 0 0 0 1 1  Risk for fall due to : Impaired balance/gait;History of fall(s);Impaired vision No Fall Risks No Fall Risks History of fall(s) History of fall(s);Orthopedic patient  Follow up Falls prevention discussed;Falls evaluation completed Falls evaluation completed Falls evaluation completed Falls prevention discussed Falls evaluation completed    FALL RISK PREVENTION PERTAINING TO THE HOME:  Any stairs in or around the home? Yes  If so, are there any without handrails? No  Home free of loose throw rugs in walkways, pet beds, electrical cords, etc? Yes  Adequate lighting in your home to reduce risk of falls? Yes   ASSISTIVE DEVICES UTILIZED TO PREVENT FALLS:  Life alert? No  Use of a cane, walker or w/c? Yes - cane Grab bars in the bathroom? Yes  Shower chair or bench in shower? No  Elevated toilet seat or a handicapped toilet? No    Cognitive Function:        10/12/2022   10:42 AM 11/27/2018    9:48 AM 08/22/2018    3:04 PM 08/21/2017    8:33 AM 10/31/2016    8:35 AM  6CIT Screen  What Year? 0 points 0 points 0 points 0 points 0 points  What month? 0 points 0 points  0 points 0 points 0 points  What time? 0 points 0 points 0 points 0 points 0 points  Count back from 20 0 points 0 points 0 points 0 points 0 points   Months in reverse 0 points 0 points 0 points 0 points 0 points  Repeat phrase 0 points 0 points 0 points 4 points 0 points  Total Score 0 points 0 points 0 points 4 points 0 points    Immunizations Immunization History  Administered Date(s) Administered   Influenza, High Dose Seasonal PF 03/08/2018   Influenza,inj,quad, With Preservative 02/15/2019   Influenza-Unspecified 01/29/2019, 02/26/2020, 02/26/2022   Meningococcal Conjugate 08/17/2014   PFIZER(Purple Top)SARS-COV-2 Vaccination 08/16/2019, 09/06/2019   Pneumococcal Conjugate-13 05/30/2013   Pneumococcal Polysaccharide-23 04/02/2012, 05/30/2014   Tdap 10/19/2015    TDAP status: Up to date  Flu Vaccine status: Up to date  Pneumococcal vaccine status: Up to date  Covid-19 vaccine status: Completed vaccines  Qualifies for Shingles Vaccine? Yes   Zostavax completed No   Shingrix Completed?: No.    Education has been provided regarding the importance of this vaccine. Patient has been advised to call insurance company to determine out of pocket expense if they have not yet received this vaccine. Advised may also receive vaccine at local pharmacy or Health Dept. Verbalized acceptance and understanding.  Screening Tests Health Maintenance  Topic Date Due   MAMMOGRAM  03/12/2022   INFLUENZA VACCINE  12/29/2022   COLONOSCOPY (Pts 45-67yrs Insurance coverage will need to be confirmed)  09/04/2023   Medicare Annual Wellness (AWV)  10/12/2023   DTaP/Tdap/Td (2 - Td or Tdap) 10/18/2025   Pneumonia Vaccine 3+ Years old  Completed   DEXA SCAN  Completed   Hepatitis C Screening  Completed   HPV VACCINES  Aged Out   COVID-19 Vaccine  Discontinued   Zoster Vaccines- Shingrix  Discontinued   Meningococcal B Vaccine  Discontinued    Health Maintenance  Health Maintenance Due  Topic Date Due   MAMMOGRAM  03/12/2022    Colorectal cancer screening: No longer required.   Mammogram status: Ordered 03/22/22. Pt provided with  contact info and advised to call to schedule appt.   Bone Density status: Completed 07/04/19. Results reflect: Bone density results: OSTEOPENIA. Repeat every 5 years.  Lung Cancer Screening: (Low Dose CT Chest recommended if Age 32-80 years, 30 pack-year currently smoking OR have quit w/in 15years.) does not qualify.   Additional Screening:  Hepatitis C Screening: does qualify; Completed 10/31/16  Vision Screening: Recommended annual ophthalmology exams for early detection of glaucoma and other disorders of the eye. Is the patient up to date with their annual eye exam?  Yes  Who is the provider or what is the name of the office in which the patient attends annual eye exams? Dr.King If pt is not established with a provider, would they like to be referred to a provider to establish care? No .   Dental Screening: Recommended annual dental exams for proper oral hygiene  Community Resource Referral / Chronic Care Management: CRR required this visit?  No   CCM required this visit?  No      Plan:     I have personally reviewed and noted the following in the patient's chart:   Medical and social history Use of alcohol, tobacco or illicit drugs  Current medications and supplements including opioid prescriptions. Patient is not currently taking opioid prescriptions. Functional ability and status Nutritional status Physical activity Advanced directives List of other physicians Hospitalizations,  surgeries, and ER visits in previous 12 months Vitals Screenings to include cognitive, depression, and falls Referrals and appointments  In addition, I have reviewed and discussed with patient certain preventive protocols, quality metrics, and best practice recommendations. A written personalized care plan for preventive services as well as general preventive health recommendations were provided to patient.     Hal Hope, LPN   [id number]/61/0960   Nurse Notes: none

## 2022-10-17 ENCOUNTER — Ambulatory Visit (INDEPENDENT_AMBULATORY_CARE_PROVIDER_SITE_OTHER): Payer: Medicare Other | Admitting: Family Medicine

## 2022-10-17 ENCOUNTER — Telehealth: Payer: Self-pay

## 2022-10-17 ENCOUNTER — Encounter: Payer: Self-pay | Admitting: Family Medicine

## 2022-10-17 ENCOUNTER — Telehealth: Payer: Self-pay | Admitting: Family Medicine

## 2022-10-17 VITALS — BP 120/78 | HR 74 | Ht 65.0 in | Wt 152.0 lb

## 2022-10-17 DIAGNOSIS — N905 Atrophy of vulva: Secondary | ICD-10-CM | POA: Diagnosis not present

## 2022-10-17 DIAGNOSIS — N342 Other urethritis: Secondary | ICD-10-CM

## 2022-10-17 DIAGNOSIS — R350 Frequency of micturition: Secondary | ICD-10-CM | POA: Diagnosis not present

## 2022-10-17 LAB — POCT URINALYSIS DIPSTICK
Bilirubin, UA: NEGATIVE
Blood, UA: NEGATIVE
Glucose, UA: NEGATIVE
Ketones, UA: NEGATIVE
Nitrite, UA: NEGATIVE
Protein, UA: NEGATIVE
Spec Grav, UA: 1.01 (ref 1.010–1.025)
Urobilinogen, UA: 0.2 E.U./dL
pH, UA: 6.5 (ref 5.0–8.0)

## 2022-10-17 MED ORDER — FLUCONAZOLE 150 MG PO TABS: 150.0000 mg | ORAL_TABLET | Freq: Once | ORAL | 0 refills | Status: AC

## 2022-10-17 MED ORDER — ESTROGENS CONJUGATED 0.625 MG/GM VA CREA: 1.0000 | TOPICAL_CREAM | Freq: Every day | VAGINAL | 12 refills | Status: DC

## 2022-10-17 MED ORDER — CEPHALEXIN 500 MG PO CAPS: 500.0000 mg | ORAL_CAPSULE | Freq: Two times a day (BID) | ORAL | 0 refills | Status: DC

## 2022-10-17 NOTE — Telephone Encounter (Signed)
Copied from CRM [id number]. Topic: General - Other >> Oct 17, 2022  3:26 PM Melissa Daniels wrote: Patient states that her insurance is requiring prior authorization for  conjugated estrogens (PREMARIN) vaginal cream. Please advise.

## 2022-10-17 NOTE — Progress Notes (Signed)
Date:  10/17/2022   Name:  Melissa Daniels   DOB:  Oct 24, 1945   MRN:  [id number]   Chief Complaint: Urinary Tract Infection (X 3 days, worse, itching, tried monistat, did not help, no hematuria )  Urinary Tract Infection  This is a new problem. The current episode started in the past 7 days. Quality: pruritic. The pain is mild. There has been no fever. Associated symptoms include frequency and hematuria. Pertinent negatives include no discharge, flank pain, hesitancy or urgency. Treatments tried: vagisil. The treatment provided no relief. atrophic vulva    Lab Results  Component Value Date   NA 138 06/13/2022   K 4.2 06/13/2022   CO2 31 06/13/2022   GLUCOSE 99 06/13/2022   BUN 17 06/13/2022   CREATININE 0.71 06/13/2022   CALCIUM 9.3 06/13/2022   EGFR 69 12/08/2021   GFRNONAA >60 06/13/2022   Lab Results  Component Value Date   CHOL 261 (H) 06/13/2022   HDL 63 06/13/2022   LDLCALC 166 (H) 06/13/2022   TRIG 159 (H) 06/13/2022   CHOLHDL 4.1 06/13/2022   No results found for: "TSH" No results found for: "HGBA1C" Lab Results  Component Value Date   WBC 5.8 06/13/2022   HGB 13.0 06/13/2022   HCT 39.5 06/13/2022   MCV 94.0 06/13/2022   PLT 243 06/13/2022   Lab Results  Component Value Date   ALT 21 06/13/2022   AST 27 06/13/2022   ALKPHOS 84 06/13/2022   BILITOT 0.8 06/13/2022   No results found for: "25OHVITD2", "25OHVITD3", "VD25OH"   Review of Systems  Genitourinary:  Positive for frequency and hematuria. Negative for difficulty urinating, dysuria, flank pain, hesitancy, urgency, vaginal bleeding, vaginal discharge and vaginal pain.    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    Esotropia 03/26/2018   Abscess of finger of right hand    Abscess of left forearm    Cellulitis 10/21/2017   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   Personal history of colonic polyps    Benign neoplasm of cecum    Benign neoplasm of transverse colon    Benign neoplasm of ascending colon    Malignant neoplasm of upper-outer quadrant of left breast in female, estrogen receptor positive (HCC) 01/10/2017   Neuropathy 10/31/2016   Taking medication for chronic disease 10/31/2016   Primary osteoarthritis of right hip 10/31/2016   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HCC) 10/19/2015   Menopause 10/19/2015   Hormone replacement therapy (HRT) 10/19/2015   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   Routine general medical examination at a health care facility 10/10/2014   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   Female stress incontinence 10/10/2014   Colon polyp 10/10/2014   Episodic paroxysmal anxiety disorder 10/10/2014    Allergies  Allergen Reactions   Aspirin    Macrobid [Nitrofurantoin]    Nsaids Other (See Comments)    History of ITP, 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, 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    Sulfa Antibiotics Other (See Comments)    Platelets drop     Past Surgical History:  Procedure Laterality Date   BREAST BIOPSY Left 12/28/2016   stereo path pend   BREAST LUMPECTOMY     COLONOSCOPY WITH PROPOFOL  N/A 09/08/2017   Procedure: COLONOSCOPY WITH PROPOFOL;  Surgeon: Midge Minium, MD;  Location: [facility];  Service: Endoscopy;  Laterality: N/A;   COLONOSCOPY WITH PROPOFOL N/A 09/03/2020   Procedure: COLONOSCOPY WITH PROPOFOL;  Surgeon: Midge Minium, MD;  Location: [facility];  Service: Endoscopy;  Laterality: N/A;  priority 4   EYE SURGERY     POLYPECTOMY  09/08/2017   Procedure: POLYPECTOMY;  Surgeon: Midge Minium, MD;  Location: [facility];  Service: Endoscopy;;   SPLENECTOMY, TOTAL      Social History    Tobacco Use   Smoking status: Never   Smokeless tobacco: Never  Vaping Use   Vaping Use: Never used  Substance Use Topics   Alcohol use: Not Currently   Drug use: Never     Medication list has been reviewed and updated.  Current Meds  Medication Sig   ALPRAZolam (XANAX) 0.25 MG tablet TAKE (1) TABLET BY MOUTH EVERY DAY AS NEEDED   Calcium Citrate-Vitamin D (CALCIUM + D PO) Take 1 tablet by mouth 2 (two) times daily.   Cholecalciferol (VITAMIN D3) 25 MCG (1000 UT) CAPS Take 1 capsule by mouth daily.   DULoxetine (CYMBALTA) 60 MG capsule Take 60 mg by mouth daily.   gabapentin (NEURONTIN) 300 MG capsule Take 1 capsule (300 mg total) by mouth at bedtime. (Patient taking differently: Take 300 mg by mouth in the morning and at bedtime. [facility] doctor- morning and night)   Multiple Vitamins-Minerals (OCUVITE ADULT 50+ PO) Take 1 tablet by mouth in the morning and at bedtime.   oxybutynin (DITROPAN-XL) 5 MG 24 hr tablet TAKE (1) TABLET BY MOUTH DAILY AT BEDTIME   simvastatin (ZOCOR) 20 MG tablet Take 1 tablet (20 mg total) by mouth at bedtime.   vitamin E 400 UNIT capsule Take 400 Units by mouth daily.       10/17/2022    9:41 AM 06/13/2022    9:09 AM 05/10/2022    4:45 PM 06/10/2021    8:07 AM  GAD 7 : Generalized Anxiety Score  Nervous, Anxious, on Edge 0 0 0 0  Control/stop worrying 0 0 0 0  Worry too much - different things 0 0 0 0  Trouble relaxing 0 0 0 0  Restless 0 0 0 0  Easily annoyed or irritable 0 0 0 0  Afraid - awful might happen 0 0 0 0  Total GAD 7 Score 0 0 0 0  Anxiety Difficulty Not difficult at all Not difficult at all Not difficult at all Not difficult at all       10/17/2022    9:40 AM 10/12/2022   10:35 AM 06/13/2022    9:09 AM  Depression screen PHQ 2/9  Decreased Interest 0 0 0  Down, Depressed, Hopeless 0 0 0  PHQ - 2 Score 0 0 0  Altered sleeping 0 0 0  Tired, decreased energy 0 0 0  Change in appetite 0 0 0  Feeling bad or failure about yourself   0 0 0  Trouble concentrating 0 0 0  Moving slowly or fidgety/restless 0 0 0  Suicidal thoughts 0 0 0  PHQ-9 Score 0 0 0  Difficult doing work/chores Not difficult at all Not difficult at all Not difficult at all    BP Readings from Last 3 Encounters:  10/17/22 120/78  06/13/22 124/70  05/10/22 124/70    Physical Exam Vitals and nursing note reviewed.  HENT:     Right Ear: Tympanic  membrane and ear canal normal.     Left Ear: Tympanic membrane and ear canal normal.     Nose: Congestion present. No rhinorrhea.     Mouth/Throat:     Pharynx: No oropharyngeal exudate or posterior oropharyngeal erythema.  Eyes:     General:        Left eye: Left eye discharge: diflu.    Pupils: Pupils are equal, round, and reactive to light.  Abdominal:     Tenderness: There is abdominal tenderness in the suprapubic area. There is no right CVA tenderness or left CVA tenderness.     Wt Readings from Last 3 Encounters:  10/17/22 152 lb (68.9 kg)  10/12/22 151 lb (68.5 kg)  06/13/22 151 lb (68.5 kg)    BP 120/78   Pulse 74   Ht 5\' 5"  (1.651 m)   Wt 152 lb (68.9 kg)   SpO2 100%   BMI 25.29 kg/m   Assessment and Plan:  1. Urine frequency New onset over the course of the week described as pruritus of the vulvar area.  Previously has seen the side OB/GYN for atrophic vulvitis and treated with estradiol maintenance pack. - POCT urinalysis dipstick  2. 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  Patient has not continued this and symptoms have resumed.  I have been unable to get the maintenance pack to come up so I went with conjugated estrogen vaginal cream applied pea-sized area to urethral opening. - conjugated estrogens (PREMARIN) vaginal cream; Place 1 Applicatorful vaginally daily.  Dispense: 42.5 g; Refill: 12  3. Urethritis Given the combination of suprapubic discomfort and leukocytes that was seen on urinalysis we  will treat with cephalexin 500 mg twice a day for 3 days and a Diflucan tablet for yeast vaginitis. - cephALEXin (KEFLEX) 500 MG capsule; Take 1 capsule (500 mg total) by mouth 2 (two) times daily.  Dispense: 6 capsule; Refill: 0 - fluconazole (DIFLUCAN) 150 MG tablet; Take 1 tablet (150 mg total) by mouth once for 1 dose.  Dispense: 1 tablet; Refill: 0    Elizabeth Sauer, MD

## 2022-10-17 NOTE — Telephone Encounter (Signed)
Called pt with need to pick up sample- she is going to call in to her GYN and see about getting in with them. They will need to prescribe this if needed long term

## 2022-10-17 NOTE — Addendum Note (Signed)
Addended by: Jerald Kief on: 10/17/2022 11:29 AM   Modules accepted: Orders

## 2022-10-18 ENCOUNTER — Telehealth: Payer: Self-pay

## 2022-10-18 ENCOUNTER — Other Ambulatory Visit: Payer: Self-pay

## 2022-10-18 DIAGNOSIS — N905 Atrophy of vulva: Secondary | ICD-10-CM

## 2022-10-18 MED ORDER — ESTROGENS CONJUGATED 0.625 MG/GM VA CREA: 1.0000 | TOPICAL_CREAM | Freq: Every day | VAGINAL | 12 refills | Status: DC

## 2022-10-18 NOTE — Telephone Encounter (Signed)
Pt aware.

## 2022-10-18 NOTE — Telephone Encounter (Signed)
Given her hx of breast cancer and the contraindication of vag ERT with hx of breast cancer, I don't feel comfortable prescribing vag ERT for her (I've never seen her, she saw [place]). While I think her risk of estrogen exposure is negligible with vag ERT, she is not my pt to manage. You can see if Logan Bores will Rx it.

## 2022-10-18 NOTE — Telephone Encounter (Signed)
Refill sent in

## 2022-10-19 LAB — URINE CULTURE

## 2022-10-20 ENCOUNTER — Other Ambulatory Visit: Payer: Self-pay

## 2022-10-20 ENCOUNTER — Telehealth: Payer: Self-pay

## 2022-10-20 DIAGNOSIS — N905 Atrophy of vulva: Secondary | ICD-10-CM

## 2022-10-20 DIAGNOSIS — N94818 Other vulvodynia: Secondary | ICD-10-CM

## 2022-10-20 MED ORDER — ESTRADIOL 0.1 MG/GM VA CREA: 0.2500 | TOPICAL_CREAM | VAGINAL | 3 refills | Status: DC

## 2022-10-20 NOTE — Telephone Encounter (Signed)
Dr. Logan Bores said this was fine. Rx has been sent

## 2022-10-20 NOTE — Telephone Encounter (Signed)
Alternate rx sent in

## 2022-10-20 NOTE — Telephone Encounter (Signed)
Pt calling; premarin was denied by ins; they will cover estradiol; can she take estradiol instead?  [phone]

## 2022-10-20 NOTE — Telephone Encounter (Signed)
Dr. Logan Bores said he was fine to Rx this (see previous message). Thx.

## 2022-10-20 NOTE — Telephone Encounter (Signed)
Patient aware.

## 2022-11-01 ENCOUNTER — Ambulatory Visit: Payer: Self-pay | Admitting: *Deleted

## 2022-11-01 NOTE — Telephone Encounter (Signed)
Summary: medication   Pt called sttd she needed to go over her meds and maybe change her simvastatin (ZOCOR) 20 MG as she does not feel as perky as she normally does. It has been going on for a while. Please f/u with pt          Called patient # [phone] to review medications and sx of "not feeling as perky".  No answer after multiple rings not able to leave message.

## 2022-11-01 NOTE — Telephone Encounter (Signed)
Appointment offered with another provider- patient declines Chief Complaint: Patient is calling to request PCP review her medications to see if any of them could be causing her to feel "sluggish'. Patient states she also gets SOB with exertion. Patient was in office recently- but did not discuss these concerns. Patient thinks her cholesterol medication may be causing symptoms- but she has been of that for years Symptoms: sluggish, no "perky", easily out of breath with exertion  Pertinent Negatives: Patient denies swelling, pain Disposition: [] ED /[] Urgent Care (no appt availability in office) / [] Appointment(In office/virtual)/ []  [facility]/ [] Home Care/ [] Refused Recommended Disposition /[] [facility]/ [x]  Follow-up with PCP Additional Notes: Patient advised she would benefit from appointment to discuss her symptoms- but first available appointment is not until midweek next week. Patient requesting appointment Monday am- she is going out of town next week. Patient advised I would send message requesting appointment for her.    Reason for Disposition  [1] Caller has NON-URGENT medicine question about med that PCP prescribed AND [2] triager unable to answer question  Answer Assessment - Initial Assessment Questions 1. NAME of MEDICINE: "What medicine(s) are you calling about?"     Possible simvastatin 2. QUESTION: "What is your question?" (e.g., double dose of medicine, side effect)     Patient was in office  3. PRESCRIBER: "Who prescribed the medicine?" Reason: if prescribed by specialist, call should be referred to that group.     PCP 4. SYMPTOMS: "Do you have any symptoms?" If Yes, ask: "What symptoms are you having?"  "How bad are the symptoms (e.g., mild, moderate, severe)     Patient is feeling sluggish- wants PCP to review her medication to see if any of her medications would cause her to be sluggish- out of breath with exertion- not as "perky" as she feels she  should be. Patient reports her urinary symptoms are really not better  Protocols used: Medication Question Call-A-AH

## 2022-11-02 ENCOUNTER — Other Ambulatory Visit (HOSPITAL_COMMUNITY)
Admission: RE | Admit: 2022-11-02 | Discharge: 2022-11-02 | Disposition: A | Discharge status: Home / Self Care | Payer: Medicare Other | Source: Ambulatory Visit | Attending: Obstetrics and Gynecology | Admitting: Obstetrics and Gynecology

## 2022-11-02 ENCOUNTER — Ambulatory Visit (INDEPENDENT_AMBULATORY_CARE_PROVIDER_SITE_OTHER): Payer: Medicare Other

## 2022-11-02 VITALS — BP 135/67 | HR 88 | Wt 152.7 lb

## 2022-11-02 DIAGNOSIS — N390 Urinary tract infection, site not specified: Secondary | ICD-10-CM

## 2022-11-02 DIAGNOSIS — N939 Abnormal uterine and vaginal bleeding, unspecified: Secondary | ICD-10-CM

## 2022-11-02 DIAGNOSIS — N898 Other specified noninflammatory disorders of vagina: Secondary | ICD-10-CM | POA: Insufficient documentation

## 2022-11-02 LAB — POCT URINALYSIS DIPSTICK
Bilirubin, UA: NEGATIVE
Blood, UA: POSITIVE
Glucose, UA: NEGATIVE
Ketones, UA: NEGATIVE
Leukocytes, UA: NEGATIVE
Nitrite, UA: NEGATIVE
Protein, UA: POSITIVE — AB
Spec Grav, UA: 1.015 (ref 1.010–1.025)
Urobilinogen, UA: 0.2 E.U./dL
pH, UA: 6.5 (ref 5.0–8.0)

## 2022-11-02 NOTE — Progress Notes (Signed)
GYN ENCOUNTER  Subjective  HPI: Melissa Daniels is a 77 y.o. [id number] who presents today for evaluation of vulvar irritation and vaginal bleeding.   Recently seen by PCP for annual physical and discussed vaginal dryness and irritation for which she has regularly been using Vagisil. PCP provided prescription for Estrace cream due to post-menopausal vulvovaginal atrophy. She was also given Diflucan for possible yeast infection at that time. She has been using the Estrace nightly for about two weeks and finds that it provides some relief of her symptoms until about midday and then she starts to notice more burning and irritation in the afternoons. Last night, she noticed some vaginal bleeding that increased today to the point where she has needed to wear a pad.   She has not been sexually active since the death of her spouse 13 years ago and denies any previous history of abnormal uterine bleeding. She denies use of any hormone replacement therapy in menopause other than the Estrace that she began using 2 weeks ago. She has had a breast lumpectomy and two spots of skin cancer removed in the last five years (one from her right arm and one from her nose). She is otherwise healthy with no other complaints.     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   Neuropathy    bilateral feet   Osteoporosis    Vertigo    several episodes per year   Past Surgical History:  Procedure Laterality Date   BREAST BIOPSY Left 12/28/2016   stereo path pend   BREAST LUMPECTOMY     COLONOSCOPY WITH PROPOFOL N/A 09/08/2017   Procedure: COLONOSCOPY WITH PROPOFOL;  Surgeon: Midge Minium, MD;  Location: [facility];  Service: Endoscopy;  Laterality: N/A;   COLONOSCOPY WITH PROPOFOL N/A  09/03/2020   Procedure: COLONOSCOPY WITH PROPOFOL;  Surgeon: Midge Minium, MD;  Location: [facility];  Service: Endoscopy;  Laterality: N/A;  priority 4   EYE SURGERY     POLYPECTOMY  09/08/2017   Procedure: POLYPECTOMY;  Surgeon: Midge Minium, MD;  Location: [facility];  Service: Endoscopy;;   SPLENECTOMY, TOTAL     OB History     Gravida  3   Para  3   Term  3   Preterm      AB      Living  3      SAB      IAB      Ectopic      Multiple      Live Births             Allergies  Allergen Reactions   Aspirin    Macrobid [Nitrofurantoin]    Nsaids Other (See Comments)    History of ITP, 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, 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    Sulfa Antibiotics Other (See Comments)    Platelets drop    Review of Systems  12 point review of systems negative except for pertinent positives noted in HPI.  Objective  BP 135/67   Pulse 88   Wt 152 lb 11.2 oz (69.3 kg)   BMI 25.41 kg/m   Physical examination   Pelvic:   Vulva: Erythema bilaterally on  both labia majora and minora, spreading down to perineum.  Vagina:  Atrophic epithelium appears pale, smooth and shiny with irritation and bleeding along vaginal vault.  Urethra No masses tenderness or scarring.  Meatus Normal size without lesions or prolapse.  Cervix: Stenotic.  No lesions. No bleeding noted at cervical os.   Anus: Normal exam.  No lesions.  Perineum: Erythematous.  No lesions.        Bimanual   Uterus: Normal size.  Non-tender.  Mobile.  AV.  Adnexae: No masses.  Non-tender to palpation.  Cul-de-sac: Negative for abnormality.    Assessment 1) Genitourinary syndrome of menopause 2) Possible abnormal uterine bleeding although unclear if source of bleeding is vaginal irritation and/or uterine 3) Wet prep swab collected to rule out BV and yeast.  4) Attempted collection of endometrial biopsy but was unable to pass collection  catheter through stenotic cervical os.   Plan 1) Continue with nightly Estrace cream utilizing lubricant for placement of applicator. Recommended trying hydrocortisone cream for external labial irritation.  2) Order placed for transvaginal ultrasound. If abnormal endometrial stripe thickness, recommend follow up appointment for endometrial biopsy with use of Cytotec.  3) Will follow up with wet prep results if any further management is indicated.   Autumn Messing, CNM 11/02/22 2:11 PM  The time spent with this patient on review of previous records, face-to-face time and documentation was 30 minutes.

## 2022-11-03 ENCOUNTER — Telehealth: Payer: Self-pay

## 2022-11-03 ENCOUNTER — Other Ambulatory Visit: Payer: Self-pay

## 2022-11-03 DIAGNOSIS — N94818 Other vulvodynia: Secondary | ICD-10-CM

## 2022-11-03 DIAGNOSIS — N905 Atrophy of vulva: Secondary | ICD-10-CM

## 2022-11-03 NOTE — Progress Notes (Signed)
Encounter opened in error

## 2022-11-03 NOTE — Telephone Encounter (Signed)
Patient called she is using the Estrace vaginal cream for postmenopausal vulvovaginal atrophy. She is out of the Estrace cream and her insurance want refill it until June 20. I offered her a sample of Premarin cream, she said that would not last her. I offered to give her 2 boxes but she was not sure if that would last. Please advise.

## 2022-11-03 NOTE — Telephone Encounter (Signed)
Verified name and DOB. Patient is concerned that she will run out of her prescription for Estrace prior to her return from travel on June 20. Reviewed current use and dosing of Estrace cream and provided reassurance that the tube she was prescribed should have enough volume to cover her during her trip. Recommended that she continue using the cream daily until she has completed two weeks and then transition to every other day. Offered her samples of Premarin cream from the office in case she runs out while on vacation, and the patient would like to pick up in the am on 6/7. Patient expressed understanding of instructions.

## 2022-11-04 LAB — CERVICOVAGINAL ANCILLARY ONLY
Bacterial Vaginitis (gardnerella): NEGATIVE
Candida Glabrata: NEGATIVE
Candida Vaginitis: NEGATIVE
Comment: NEGATIVE
Comment: NEGATIVE
Comment: NEGATIVE

## 2022-11-07 ENCOUNTER — Encounter: Payer: Self-pay | Admitting: Family Medicine

## 2022-11-07 ENCOUNTER — Ambulatory Visit (INDEPENDENT_AMBULATORY_CARE_PROVIDER_SITE_OTHER): Payer: Medicare Other | Admitting: Family Medicine

## 2022-11-07 VITALS — BP 124/78 | HR 76 | Ht 65.0 in | Wt 154.0 lb

## 2022-11-07 DIAGNOSIS — R5383 Other fatigue: Secondary | ICD-10-CM | POA: Diagnosis not present

## 2022-11-07 DIAGNOSIS — D692 Other nonthrombocytopenic purpura: Secondary | ICD-10-CM

## 2022-11-07 MED ORDER — SIMVASTATIN 10 MG PO TABS: 10.0000 mg | ORAL_TABLET | Freq: Every day | ORAL | 1 refills | Status: DC

## 2022-11-07 NOTE — Progress Notes (Signed)
Date:  11/07/2022   Name:  Melissa Daniels   DOB:  Mar 05, 1946   MRN:  [id number]   Chief Complaint: Fatigue (Under toes are purple)  Thyroid Problem Presents for initial (Template for presentation of fatigue.) visit. Symptoms include fatigue and menstrual problem. Patient reports no anxiety, depressed mood, diaphoresis, dry skin, hair loss, palpitations, weight gain or weight loss. The symptoms have been stable.    Lab Results  Component Value Date   NA 138 06/13/2022   K 4.2 06/13/2022   CO2 31 06/13/2022   GLUCOSE 99 06/13/2022   BUN 17 06/13/2022   CREATININE 0.71 06/13/2022   CALCIUM 9.3 06/13/2022   EGFR 69 12/08/2021   GFRNONAA >60 06/13/2022   Lab Results  Component Value Date   CHOL 261 (H) 06/13/2022   HDL 63 06/13/2022   LDLCALC 166 (H) 06/13/2022   TRIG 159 (H) 06/13/2022   CHOLHDL 4.1 06/13/2022   No results found for: "TSH" No results found for: "HGBA1C" Lab Results  Component Value Date   WBC 5.8 06/13/2022   HGB 13.0 06/13/2022   HCT 39.5 06/13/2022   MCV 94.0 06/13/2022   PLT 243 06/13/2022   Lab Results  Component Value Date   ALT 21 06/13/2022   AST 27 06/13/2022   ALKPHOS 84 06/13/2022   BILITOT 0.8 06/13/2022   No results found for: "25OHVITD2", "25OHVITD3", "VD25OH"   Review of Systems  Constitutional:  Positive for fatigue. Negative for chills, diaphoresis, fever, unexpected weight change, weight gain and weight loss.  Respiratory:  Negative for cough, chest tightness and shortness of breath.   Cardiovascular:  Negative for chest pain, palpitations and leg swelling.  Genitourinary:  Positive for menstrual problem. Negative for frequency.  Hematological:  Negative for adenopathy. Does not bruise/bleed easily.  Psychiatric/Behavioral:  The patient is not nervous/anxious.     Patient Active Problem List   Diagnosis Date Noted   Primary osteoarthritis of right knee 02/22/2021   History of strabismus surgery 01/18/2021   Divergence  insufficiency 08/06/2020   Dry eye syndrome of both eyes 08/06/2020   Binocular vision disorder with diplopia 08/06/2020   Sixth (abducent) nerve palsy, 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   Esotropia 03/26/2018   Abscess of finger of right hand    Abscess of left forearm    Cellulitis 10/21/2017   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   Personal history of colonic polyps    Benign neoplasm of cecum    Benign neoplasm of transverse colon    Benign neoplasm of ascending colon    Malignant neoplasm of upper-outer quadrant of left breast in female, estrogen receptor positive (HCC) 01/10/2017   Neuropathy 10/31/2016   Taking medication for chronic disease 10/31/2016   Primary osteoarthritis of right hip 10/31/2016   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HCC) 10/19/2015   Menopause 10/19/2015   Hormone replacement therapy (HRT) 10/19/2015   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   Routine general medical examination at a health care facility 10/10/2014   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   Female stress incontinence 10/10/2014   Colon polyp 10/10/2014   Episodic paroxysmal anxiety disorder 10/10/2014    Allergies  Allergen Reactions   Aspirin    Macrobid [Nitrofurantoin]    Nsaids Other (See Comments)    History of ITP, 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, 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    Sulfa Antibiotics Other (See Comments)    Platelets drop     Past Surgical History:  Procedure Laterality Date  BREAST BIOPSY Left 12/28/2016   stereo path pend   BREAST LUMPECTOMY     COLONOSCOPY WITH PROPOFOL N/A 09/08/2017   Procedure: COLONOSCOPY WITH PROPOFOL;  Surgeon: Midge Minium, MD;  Location: [facility];  Service: Endoscopy;  Laterality: N/A;   COLONOSCOPY WITH PROPOFOL N/A 09/03/2020   Procedure: COLONOSCOPY WITH PROPOFOL;  Surgeon: Midge Minium, MD;  Location: [facility];  Service: Endoscopy;  Laterality: N/A;  priority 4   EYE SURGERY      POLYPECTOMY  09/08/2017   Procedure: POLYPECTOMY;  Surgeon: Midge Minium, MD;  Location: [facility];  Service: Endoscopy;;   SPLENECTOMY, TOTAL      Social History   Tobacco Use   Smoking status: Never   Smokeless tobacco: Never  Vaping Use   Vaping Use: Never used  Substance Use Topics   Alcohol use: Not Currently   Drug use: Never     Medication list has been reviewed and updated.  Current Meds  Medication Sig   ALPRAZolam (XANAX) 0.25 MG tablet TAKE (1) TABLET BY MOUTH EVERY DAY AS NEEDED   Calcium Citrate-Vitamin D (CALCIUM + D PO) Take 1 tablet by mouth 2 (two) times daily.   Cholecalciferol (VITAMIN D3) 25 MCG (1000 UT) CAPS Take 1 capsule by mouth daily.   DULoxetine (CYMBALTA) 60 MG capsule Take 60 mg by mouth daily.   estradiol (ESTRACE) 0.1 MG/GM vaginal cream Place 0.25 Applicatorfuls vaginally every other day.   gabapentin (NEURONTIN) 300 MG capsule Take 1 capsule (300 mg total) by mouth at bedtime. (Patient taking differently: Take 300 mg by mouth in the morning and at bedtime. [facility] doctor- morning and night)   Multiple Vitamins-Minerals (OCUVITE ADULT 50+ PO) Take 1 tablet by mouth in the morning and at bedtime.   oxybutynin (DITROPAN-XL) 5 MG 24 hr tablet TAKE (1) TABLET BY MOUTH DAILY AT BEDTIME   simvastatin (ZOCOR) 10 MG tablet Take 1 tablet (10 mg total) by mouth at bedtime.   vitamin E 400 UNIT capsule Take 400 Units by mouth daily.   [DISCONTINUED] conjugated estrogens (PREMARIN) vaginal cream Place 1 Applicatorful vaginally daily.   [DISCONTINUED] simvastatin (ZOCOR) 20 MG tablet Take 1 tablet (20 mg total) by mouth at bedtime.       10/17/2022    9:41 AM 06/13/2022    9:09 AM 05/10/2022    4:45 PM 06/10/2021    8:07 AM  GAD 7 : Generalized Anxiety Score  Nervous, Anxious, on Edge 0 0 0 0  Control/stop worrying 0 0 0 0  Worry too much - different things 0 0 0 0  Trouble relaxing 0 0 0 0  Restless 0 0 0 0  Easily annoyed or irritable 0 0 0  0  Afraid - awful might happen 0 0 0 0  Total GAD 7 Score 0 0 0 0  Anxiety Difficulty Not difficult at all Not difficult at all Not difficult at all Not difficult at all       10/17/2022    9:40 AM 10/12/2022   10:35 AM 06/13/2022    9:09 AM  Depression screen PHQ 2/9  Decreased Interest 0 0 0  Down, Depressed, Hopeless 0 0 0  PHQ - 2 Score 0 0 0  Altered sleeping 0 0 0  Tired, decreased energy 0 0 0  Change in appetite 0 0 0  Feeling bad or failure about yourself  0 0 0  Trouble concentrating 0 0 0  Moving slowly or fidgety/restless 0 0  0  Suicidal thoughts 0 0 0  PHQ-9 Score 0 0 0  Difficult doing work/chores Not difficult at all Not difficult at all Not difficult at all    BP Readings from Last 3 Encounters:  11/07/22 124/78  11/02/22 135/67  10/17/22 120/78    Physical Exam Vitals and nursing note reviewed.  HENT:     Right Ear: Tympanic membrane normal.     Left Ear: Tympanic membrane normal.     Nose: Nose normal.     Mouth/Throat:     Mouth: Mucous membranes are moist.  Cardiovascular:     Rate and Rhythm: Normal rate and regular rhythm.     Heart sounds: No murmur heard.    No friction rub. No gallop.  Pulmonary:     Effort: Pulmonary effort is normal. No respiratory distress.     Breath sounds: Normal breath sounds. No wheezing, rhonchi or rales.  Abdominal:     Tenderness: There is no abdominal tenderness. There is no guarding.  Musculoskeletal:     Cervical back: Neck supple.  Skin:    General: Skin is warm.     Capillary Refill: Capillary refill takes less than 2 seconds.     Coloration: Skin is mottled.  Neurological:     General: No focal deficit present.     Mental Status: She is alert.     Sensory: No sensory deficit.     Motor: No weakness.     Wt Readings from Last 3 Encounters:  11/07/22 154 lb (69.9 kg)  11/02/22 152 lb 11.2 oz (69.3 kg)  10/17/22 152 lb (68.9 kg)    BP 124/78   Pulse 76   Ht 5\' 5"  (1.651 m)   Wt 154 lb (69.9  kg)   SpO2 100%   BMI 25.63 kg/m   Assessment and Plan:  1. Fatigue, unspecified type New onset.  Persistent.  Stable.  Patient has been having fatigue over the past couple of weeks.  The only thing that I can see that that I am may be contributing to this that I increased her simvastatin from 10 to 20 mg.  We will decrease her simvastatin to 10 mg and in the meantime we will draw lab work looking for taking factors including a thyroid panel with TSH, sed rate, and rheumatoid factor.  Patient does have a history of ITP and is also inquired about the blanching purpleish areas underneath both feet. - Thyroid Panel With TSH - Sed Rate (ESR) - Rheumatoid Factor  2. Purpura (HCC) Chronic.  Episodic.  Patient has noticed for a couple of months.  Stable.  Patient with history of chronic ITP upon examination there is a purpleish hue to the bottom of the feet which blanches on pressure.  Patient does have a history of ITP but that seems to be more of a pooling of blood that moves when pressed.  I do not think that this is of the serious nature but we reviewed the CBC and CMP with no abnormality.  Patient will discontinue her calcium that she is currently taking to see if this may help with the fatigability.  In the meantime review of previous platelet counts have all been stable in the most recent past.  Another incidental finding is that there are petechiae in the lower legs which is also contributed to by her chronic ITP  Patient does relate to some breast nipple tenderness which I suspect may be due to estradiol vaginal cream that is being  used intravaginally.  I am not certain if sufficient amount of this can be absorbed and can cause some breast tenderness but I will defer to GYN at this time. Elizabeth Sauer, MD

## 2022-11-08 DIAGNOSIS — R5383 Other fatigue: Secondary | ICD-10-CM | POA: Diagnosis not present

## 2022-11-09 LAB — SEDIMENTATION RATE: Sed Rate: 3 mm/hr (ref 0–40)

## 2022-11-09 LAB — THYROID PANEL WITH TSH
Free Thyroxine Index: 1.2 (ref 1.2–4.9)
T3 Uptake Ratio: 22 % — ABNORMAL LOW (ref 24–39)
T4, Total: 5.3 ug/dL (ref 4.5–12.0)
TSH: 2.85 u[IU]/mL (ref 0.450–4.500)

## 2022-11-09 LAB — RHEUMATOID FACTOR: Rheumatoid fact SerPl-aCnc: 10.6 IU/mL (ref <14.0)

## 2022-11-09 NOTE — Progress Notes (Signed)
PC to pt discussed labs, voiced understanding.

## 2022-11-15 ENCOUNTER — Ambulatory Visit: Payer: Medicare Other | Admitting: Family Medicine

## 2022-11-16 ENCOUNTER — Other Ambulatory Visit: Payer: Medicare Other

## 2022-11-17 ENCOUNTER — Ambulatory Visit (INDEPENDENT_AMBULATORY_CARE_PROVIDER_SITE_OTHER): Payer: Medicare Other

## 2022-11-17 DIAGNOSIS — N939 Abnormal uterine and vaginal bleeding, unspecified: Secondary | ICD-10-CM | POA: Diagnosis not present

## 2022-11-21 ENCOUNTER — Telehealth: Payer: Self-pay

## 2022-11-21 NOTE — Telephone Encounter (Signed)
Pt would like a call back to discuss her U/S results, she's aware you will be in the office tomorrow.

## 2022-11-22 ENCOUNTER — Telehealth: Payer: Self-pay

## 2022-11-22 NOTE — Telephone Encounter (Signed)
Verified name and DOB. Called patient to discuss her ultrasound results per her request. Pelvic ultrasound imaging reviewed with Dr. Logan Bores who saw nothing of concern to follow up on at this time. The patient and I discussed that most fibroids are benign and do not require follow up unless they become symptomatic. She states that her bleeding only returns when she does not use her estrogen cream regularly. We discussed continuing use every other day and using hydrocortisone cream for external irritation. She will follow up as needed with any worsening or unresolved symptoms.

## 2022-11-22 NOTE — Telephone Encounter (Signed)
Pt calling; called yesterday; would like to speak with Dr. Tandy Gaw to tell her what her results mean.  [phone]

## 2022-12-09 DIAGNOSIS — H353131 Nonexudative age-related macular degeneration, bilateral, early dry stage: Secondary | ICD-10-CM | POA: Diagnosis not present

## 2022-12-09 DIAGNOSIS — H43813 Vitreous degeneration, bilateral: Secondary | ICD-10-CM | POA: Diagnosis not present

## 2022-12-09 DIAGNOSIS — H2513 Age-related nuclear cataract, bilateral: Secondary | ICD-10-CM | POA: Diagnosis not present

## 2022-12-12 ENCOUNTER — Ambulatory Visit (INDEPENDENT_AMBULATORY_CARE_PROVIDER_SITE_OTHER): Payer: Medicare Other | Admitting: Family Medicine

## 2022-12-12 ENCOUNTER — Ambulatory Visit: Payer: Medicare Other | Admitting: Family Medicine

## 2022-12-12 ENCOUNTER — Encounter: Payer: Self-pay | Admitting: Family Medicine

## 2022-12-12 VITALS — BP 120/70 | HR 88 | Ht 65.0 in | Wt 151.0 lb

## 2022-12-12 DIAGNOSIS — D693 Immune thrombocytopenic purpura: Secondary | ICD-10-CM | POA: Diagnosis not present

## 2022-12-12 DIAGNOSIS — E7849 Other hyperlipidemia: Secondary | ICD-10-CM | POA: Diagnosis not present

## 2022-12-12 DIAGNOSIS — N393 Stress incontinence (female) (male): Secondary | ICD-10-CM | POA: Diagnosis not present

## 2022-12-12 DIAGNOSIS — R5383 Other fatigue: Secondary | ICD-10-CM | POA: Diagnosis not present

## 2022-12-12 MED ORDER — SIMVASTATIN 20 MG PO TABS: 20.0000 mg | ORAL_TABLET | Freq: Every day | ORAL | 1 refills | Status: DC

## 2022-12-12 MED ORDER — SIMVASTATIN 20 MG PO TABS: 20.0000 mg | ORAL_TABLET | Freq: Every day | ORAL | Status: DC

## 2022-12-12 MED ORDER — OXYBUTYNIN CHLORIDE ER 5 MG PO TB24: ORAL_TABLET | ORAL | 1 refills | Status: DC

## 2022-12-12 MED ORDER — ATORVASTATIN CALCIUM 10 MG PO TABS: 10.0000 mg | ORAL_TABLET | Freq: Every day | ORAL | 1 refills | Status: DC

## 2022-12-12 NOTE — Progress Notes (Signed)
Date:  12/12/2022   Name:  Melissa Daniels   DOB:  [birth year]   MRN:  [id number]   Chief Complaint: overactive bladder and Hyperlipidemia  Hyperlipidemia This is a chronic problem. The current episode started more than 1 year ago. Recent lipid tests were reviewed and are normal. She has no history of chronic renal disease, diabetes, hypothyroidism, liver disease, obesity or nephrotic syndrome. There are no known factors aggravating her hyperlipidemia. Pertinent negatives include no chest pain or shortness of breath. She is currently on no antihyperlipidemic treatment. The current treatment provides mild improvement of lipids. There are no compliance problems.  Risk factors for coronary artery disease include dyslipidemia.  Urinary Frequency  This is a chronic problem. The current episode started more than 1 year ago. The problem occurs intermittently. The problem has been gradually improving. The quality of the pain is described as burning. There has been no fever. Associated symptoms include frequency. Pertinent negatives include no chills, flank pain, hesitancy or urgency. Treatments tried: medication. The treatment provided moderate relief.    Lab Results  Component Value Date   NA 138 06/13/2022   K 4.2 06/13/2022   CO2 31 06/13/2022   GLUCOSE 99 06/13/2022   BUN 17 06/13/2022   CREATININE 0.71 06/13/2022   CALCIUM 9.3 06/13/2022   EGFR 69 12/08/2021   GFRNONAA >60 06/13/2022   Lab Results  Component Value Date   CHOL 261 (H) 06/13/2022   HDL 63 06/13/2022   LDLCALC 166 (H) 06/13/2022   TRIG 159 (H) 06/13/2022   CHOLHDL 4.1 06/13/2022   Lab Results  Component Value Date   TSH 2.850 11/08/2022   No results found for: "HGBA1C" Lab Results  Component Value Date   WBC 5.8 06/13/2022   HGB 13.0 06/13/2022   HCT 39.5 06/13/2022   MCV 94.0 06/13/2022   PLT 243 06/13/2022   Lab Results  Component Value Date   ALT 21 06/13/2022   AST 27 06/13/2022   ALKPHOS 84 06/13/2022    BILITOT 0.8 06/13/2022   No results found for: "25OHVITD2", "25OHVITD3", "VD25OH"   Review of Systems  Constitutional:  Negative for chills.  HENT:  Negative for trouble swallowing.   Eyes:  Negative for visual disturbance.  Respiratory:  Negative for chest tightness, shortness of breath and wheezing.   Cardiovascular:  Negative for chest pain, palpitations and leg swelling.  Gastrointestinal:  Negative for abdominal pain, blood in stool and constipation.  Endocrine: Negative for polydipsia and polyuria.  Genitourinary:  Positive for frequency. Negative for dyspareunia, flank pain, hesitancy and urgency.    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   Personal history of colonic polyps    Benign neoplasm of cecum    Benign neoplasm of transverse colon    Benign neoplasm of ascending colon    Malignant neoplasm of upper-outer quadrant of left breast in female, estrogen receptor positive (HCC) 01/10/2017   Neuropathy 10/31/2016   Taking medication for chronic disease 10/31/2016   Primary osteoarthritis of right hip 10/31/2016   Idiopathic thrombocytopenic  purpura (HCC) 10/19/2015   Menopause 10/19/2015   Hormone replacement therapy (HRT) 10/19/2015   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   Routine general medical examination at a health care facility 10/10/2014   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   Female stress incontinence 10/10/2014   Colon polyp 10/10/2014   Episodic paroxysmal anxiety disorder 10/10/2014    Allergies  Allergen  Reactions   Aspirin    Macrobid [Nitrofurantoin]    Nsaids Other (See Comments)    History of ITP, 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, 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    Sulfa Antibiotics Other (See Comments)    Platelets drop     Past Surgical History:  Procedure Laterality Date   BREAST BIOPSY Left 12/28/2016   stereo path pend   BREAST LUMPECTOMY     COLONOSCOPY WITH PROPOFOL N/A 09/08/2017   Procedure: COLONOSCOPY WITH PROPOFOL;  Surgeon: Midge Minium, MD;  Location: [facility];  Service: Endoscopy;  Laterality: N/A;   COLONOSCOPY WITH PROPOFOL N/A 09/03/2020   Procedure: COLONOSCOPY WITH PROPOFOL;  Surgeon: Midge Minium, MD;  Location: [facility];  Service: Endoscopy;  Laterality: N/A;  priority 4   EYE SURGERY     POLYPECTOMY  09/08/2017   Procedure: POLYPECTOMY;  Surgeon: Midge Minium, MD;  Location: [facility];  Service: Endoscopy;;   SPLENECTOMY, TOTAL      Social History   Tobacco Use   Smoking status: Never   Smokeless tobacco: Never  Vaping Use   Vaping status: Never Used  Substance Use Topics   Alcohol use: Not Currently   Drug use: Never     Medication list has been reviewed and updated.  Current Meds  Medication Sig   ALPRAZolam (XANAX) 0.25 MG tablet TAKE (1) TABLET BY MOUTH EVERY DAY AS NEEDED   Cholecalciferol (VITAMIN D3) 25 MCG (1000 UT) CAPS Take 1 capsule by mouth daily.   DULoxetine (CYMBALTA) 60 MG capsule Take 60 mg by mouth daily.   estradiol (ESTRACE) 0.1 MG/GM vaginal cream Place 0.25 Applicatorfuls vaginally every other day.   gabapentin (NEURONTIN) 300 MG capsule Take 1 capsule (300 mg total) by mouth at bedtime. (Patient taking differently: Take 300 mg by mouth in the morning and at bedtime. [facility] doctor- morning and night)   Multiple Vitamins-Minerals (OCUVITE ADULT 50+ PO) Take 1 tablet by mouth in the morning and at bedtime.   oxybutynin (DITROPAN-XL) 5 MG 24 hr tablet TAKE (1)  TABLET BY MOUTH DAILY AT BEDTIME   simvastatin (ZOCOR) 20 MG tablet Take 20 mg by mouth daily.   vitamin E 400 UNIT capsule Take 400 Units by mouth daily.   [DISCONTINUED] Calcium Citrate-Vitamin D (CALCIUM + D PO) Take 1 tablet by mouth 2 (two) times daily.   [DISCONTINUED] simvastatin (ZOCOR) 10 MG tablet Take 1 tablet (10 mg total) by mouth at bedtime.       12/12/2022    8:29 AM 10/17/2022    9:41 AM 06/13/2022    9:09 AM 05/10/2022    4:45 PM  GAD 7 : Generalized Anxiety Score  Nervous, Anxious, on Edge 0 0 0 0  Control/stop worrying 0 0 0 0  Worry too much - different things 0 0 0 0  Trouble relaxing 0 0 0 0  Restless 0 0 0 0  Easily annoyed or irritable 0 0 0 0  Afraid - awful might happen 0 0 0 0  Total GAD 7 Score 0 0 0 0  Anxiety Difficulty  Not difficult at all Not difficult at all Not difficult at all Not difficult at all       12/12/2022    8:29 AM 10/17/2022    9:40 AM 10/12/2022   10:35 AM  Depression screen PHQ 2/9  Decreased Interest 0 0 0  Down, Depressed, Hopeless 0 0 0  PHQ - 2 Score 0 0 0  Altered sleeping 0 0 0  Tired, decreased energy 0 0 0  Change in appetite 0 0 0  Feeling bad or failure about yourself  0 0 0  Trouble concentrating 0 0 0  Moving slowly or fidgety/restless 0 0 0  Suicidal thoughts 0 0 0  PHQ-9 Score 0 0 0  Difficult doing work/chores Not difficult at all Not difficult at all Not difficult at all    BP Readings from Last 3 Encounters:  12/12/22 120/70  11/07/22 124/78  11/02/22 135/67    Physical Exam Vitals reviewed.  Constitutional:      Appearance: She is well-developed.  HENT:     Head: Normocephalic.     Right Ear: External ear normal.     Left Ear: External ear normal.     Mouth/Throat:     Mouth: Mucous membranes are dry.  Eyes:     General: Lids are everted, no foreign bodies appreciated. No scleral icterus.       Left eye: No foreign body or hordeolum.     Conjunctiva/sclera: Conjunctivae normal.     Right eye:  Right conjunctiva is not injected.     Left eye: Left conjunctiva is not injected.     Pupils: Pupils are equal, round, and reactive to light.  Neck:     Thyroid: No thyromegaly.     Vascular: No JVD.     Trachea: No tracheal deviation.  Cardiovascular:     Rate and Rhythm: Normal rate and regular rhythm.     Heart sounds: Normal heart sounds. No murmur heard.    No friction rub. No gallop.  Pulmonary:     Effort: Pulmonary effort is normal. No respiratory distress.     Breath sounds: Normal breath sounds. No wheezing, rhonchi or rales.  Abdominal:     General: Bowel sounds are normal.     Palpations: Abdomen is soft. There is no mass.     Tenderness: There is no abdominal tenderness. There is no guarding or rebound.  Musculoskeletal:        General: No tenderness. Normal range of motion.     Cervical back: Normal range of motion and neck supple.  Lymphadenopathy:     Cervical: No cervical adenopathy.  Skin:    General: Skin is warm.     Findings: No bruising, erythema or rash.  Neurological:     Mental Status: She is alert and oriented to person, place, and time.     Cranial Nerves: No cranial nerve deficit.     Deep Tendon Reflexes: Reflexes normal.  Psychiatric:        Mood and Affect: Mood is not anxious or depressed.     Wt Readings from Last 3 Encounters:  12/12/22 151 lb (68.5 kg)  11/07/22 154 lb (69.9 kg)  11/02/22 152 lb 11.2 oz (69.3 kg)    BP 120/70   Pulse 88   Ht 5\' 5"  (1.651 m)   Wt 151 lb (68.5 kg)   SpO2 98%   BMI 25.13 kg/m   Assessment and Plan: 1. Familial multiple lipoprotein-type hyperlipidemia Chronic.  Controlled.  Stable.  Unable to tolerate atorvastatin 20 mg and therefore we have returned to 10 mg and patient is feeling better.  Will check lipid panel current dosing at 10 mg and reemphasized low-cholesterol low triglyceride dietary guidelines. - Lipid Panel With LDL/HDL Ratio  2. 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  Controlled.   Stable.  Controlled with Ditropan XL 5 mg nightly.  Will continue at current dosing. - oxybutynin (DITROPAN-XL) 5 MG 24 hr tablet; TAKE (1) TABLET BY MOUTH DAILY AT BEDTIME  Dispense: 90 tablet; Refill: 1  3. Fatigue, unspecified type Patient still continues to have some fatigue but not as much as when she was on the increased dosing of atorvastatin to 20 mg.  Currently we have returned to 10 mg and we will continue to monitor her fatigue.  Review of thyroid panel was acceptable.  4. 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HCC) With history of ITP.  There is been no increased bruising or purpura noted.  Will check CBC for current cell levels and particular platelets. - CBC w/Diff/Platelet     Elizabeth Sauer, MD

## 2022-12-12 NOTE — Patient Instructions (Signed)

## 2022-12-13 ENCOUNTER — Other Ambulatory Visit: Payer: Self-pay

## 2022-12-13 DIAGNOSIS — E7849 Other hyperlipidemia: Secondary | ICD-10-CM

## 2022-12-13 LAB — LIPID PANEL WITH LDL/HDL RATIO
Cholesterol, Total: 220 mg/dL — ABNORMAL HIGH (ref 100–199)
HDL: 54 mg/dL (ref >39)
LDL Chol Calc (NIH): 127 mg/dL — ABNORMAL HIGH (ref 0–99)
LDL/HDL Ratio: 2.4 ratio (ref 0.0–3.2)
Triglycerides: 219 mg/dL — ABNORMAL HIGH (ref 0–149)
VLDL Cholesterol Cal: 39 mg/dL (ref 5–40)

## 2022-12-13 LAB — CBC WITH DIFFERENTIAL/PLATELET
Basophils Absolute: 0.1 10*3/uL (ref 0.0–0.2)
Basos: 1 %
EOS (ABSOLUTE): 0.2 10*3/uL (ref 0.0–0.4)
Eos: 3 %
Hematocrit: 38.5 % (ref 34.0–46.6)
Hemoglobin: 13.1 g/dL (ref 11.1–15.9)
Immature Grans (Abs): 0 10*3/uL (ref 0.0–0.1)
Immature Granulocytes: 0 %
Lymphocytes Absolute: 2.6 10*3/uL (ref 0.7–3.1)
Lymphs: 44 %
MCH: 31 pg (ref 26.6–33.0)
MCHC: 34 g/dL (ref 31.5–35.7)
MCV: 91 fL (ref 79–97)
Monocytes Absolute: 0.8 10*3/uL (ref 0.1–0.9)
Monocytes: 14 %
Neutrophils Absolute: 2.2 10*3/uL (ref 1.4–7.0)
Neutrophils: 38 %
Platelets: 274 10*3/uL (ref 150–450)
RBC: 4.23 x10E6/uL (ref 3.77–5.28)
RDW: 13.3 % (ref 11.7–15.4)
WBC: 5.9 10*3/uL (ref 3.4–10.8)

## 2022-12-13 MED ORDER — SIMVASTATIN 20 MG PO TABS: 20.0000 mg | ORAL_TABLET | Freq: Every day | ORAL | 0 refills | Status: DC

## 2022-12-14 ENCOUNTER — Ambulatory Visit: Payer: Medicare Other | Admitting: Family Medicine

## 2022-12-26 DIAGNOSIS — G629 Polyneuropathy, unspecified: Secondary | ICD-10-CM | POA: Diagnosis not present

## 2023-02-10 ENCOUNTER — Encounter: Payer: Self-pay | Admitting: Emergency Medicine

## 2023-02-10 ENCOUNTER — Ambulatory Visit
Admission: EM | Admit: 2023-02-10 | Discharge: 2023-02-10 | Disposition: A | Discharge status: Home / Self Care | Payer: Medicare Other | Attending: Physician Assistant | Admitting: Physician Assistant

## 2023-02-10 DIAGNOSIS — R051 Acute cough: Secondary | ICD-10-CM | POA: Diagnosis not present

## 2023-02-10 DIAGNOSIS — U071 COVID-19: Secondary | ICD-10-CM | POA: Insufficient documentation

## 2023-02-10 DIAGNOSIS — R0981 Nasal congestion: Secondary | ICD-10-CM | POA: Insufficient documentation

## 2023-02-10 LAB — SARS CORONAVIRUS 2 BY RT PCR: SARS Coronavirus 2 by RT PCR: POSITIVE — AB

## 2023-02-10 MED ORDER — NIRMATRELVIR/RITONAVIR (PAXLOVID)TABLET: 3.0000 | ORAL_TABLET | Freq: Two times a day (BID) | ORAL | 0 refills | Status: AC

## 2023-02-10 NOTE — Discharge Instructions (Addendum)
-  COVID test positive.  You should isolate until you are fever free for 24 hours and your symptoms are beginning to improve, about 5 days or so from symptom onset.  Wear a mask as long as you are coughing, this could be an additional 5 days. -Simvastatin should be discontinued for at least 12 hours prior to starting nirmatrelvir/ritonavir, during the 5 days of therapy with nirmatrelvir/ritonavir and for an additional 5 days after nirmatrelvir/ritonavir therapy has been completed.  - May use prescription cough medication as prescribed or OTC Robitussin, Mucinex.  Make sure to increase rest and fluids.  Tylenol for any fever. - You should be seen again if you have uncontrollable fever, weakness or trouble breathing.

## 2023-02-10 NOTE — ED Triage Notes (Addendum)
Patient c/o cough, runny nose, and chills that started Wed.  Patient unsure of fevers.  Patient did have a positive home covid test today.

## 2023-02-10 NOTE — ED Provider Notes (Signed)
[facility]    CSN: [id number] Arrival date & time: 02/10/23  1921      History   Chief Complaint Chief Complaint  Patient presents with   Cough   Nasal Congestion    HPI Melissa Daniels is a 77 y.o. female with a history of ITP, breast cancer, osteoporosis, vertigo, and history of total splenectomy.   Patient says she has had cough, congestion, chills and fatigue x 2 days.  She denies fever, difficulty swallowing, chest pain, shortness of breath or wheezing.  Has taken OTC meds for symptoms.  Reports taking a COVID test at home and says it was positive but she does not want to leave that it could actually be positive and would like to be tested again.  COVID-19 in 2022.  Took Paxlovid and states that it works well.  States she is concerned that she does not explain and if she is positive for COVID like to take antiviral medication.  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   Personal history of colonic polyps    Benign neoplasm of cecum    Benign neoplasm of transverse colon    Benign neoplasm of ascending colon    Malignant neoplasm of upper-outer quadrant of left breast in female,  estrogen receptor positive (HCC) 01/10/2017   Neuropathy 10/31/2016   Taking medication for chronic disease 10/31/2016   Primary osteoarthritis of right hip 10/31/2016   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HCC) 10/19/2015   Menopause 10/19/2015   Hormone replacement therapy (HRT) 10/19/2015   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   Routine general medical examination at a health care facility 10/10/2014   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   Female stress incontinence 10/10/2014   Colon polyp 10/10/2014   Episodic paroxysmal anxiety disorder 10/10/2014    Past Surgical History:  Procedure Laterality Date   BREAST BIOPSY Left 12/28/2016   stereo path pend   BREAST LUMPECTOMY     COLONOSCOPY WITH PROPOFOL N/A 09/08/2017   Procedure: COLONOSCOPY WITH PROPOFOL;  Surgeon: Midge Minium, MD;  Location: [facility];  Service: Endoscopy;  Laterality: N/A;   COLONOSCOPY WITH PROPOFOL N/A 09/03/2020   Procedure: COLONOSCOPY WITH PROPOFOL;  Surgeon: Midge Minium, MD;  Location: [facility];  Service: Endoscopy;  Laterality: N/A;  priority 4   EYE SURGERY     POLYPECTOMY  09/08/2017   Procedure: POLYPECTOMY;  Surgeon: Midge Minium, MD;  Location: [facility];  Service: Endoscopy;;   SPLENECTOMY, TOTAL      OB History     Gravida  3   Para  3   Term  3  Preterm      AB      Living  3      SAB      IAB      Ectopic      Multiple      Live Births               Home Medications    Prior to Admission medications   Medication Sig Start Date End Date Taking? Authorizing Provider  nirmatrelvir/ritonavir (PAXLOVID) 20 x 150 MG & 10 x 100MG  TABS Take 3 tablets by mouth 2 (two) times daily for 5 days. Patient GFR is over 60. Take nirmatrelvir (150 mg) two tablets twice daily for 5 days and ritonavir (100 mg) one tablet twice daily for 5 days. 02/10/23 02/15/23 Yes Eusebio Friendly B, PA-C  ALPRAZolam (XANAX) 0.25 MG tablet TAKE (1)  TABLET BY MOUTH EVERY DAY AS NEEDED 03/31/22   Duanne Limerick, MD  Cholecalciferol (VITAMIN D3) 25 MCG (1000 UT) CAPS Take 1 capsule by mouth daily.    [provider]  DULoxetine (CYMBALTA) 60 MG capsule Take 60 mg by mouth daily. 06/25/21   [provider]  estradiol (ESTRACE) 0.1 MG/GM vaginal cream Place 0.25 Applicatorfuls vaginally every other day. 10/20/22 10/20/23  Linzie Collin, MD  gabapentin (NEURONTIN) 300 MG capsule Take 1 capsule (300 mg total) by mouth at bedtime. Patient taking differently: Take 300 mg by mouth in the morning and at bedtime. [facility] doctor- morning and night 06/28/19   Duanne Limerick, MD  Multiple Vitamins-Minerals (OCUVITE ADULT 50+ PO) Take 1 tablet by mouth in the morning and at bedtime.    [provider]  oxybutynin (DITROPAN-XL) 5 MG 24 hr tablet TAKE (1) TABLET BY MOUTH DAILY AT BEDTIME 12/12/22   Duanne Limerick, MD  simvastatin (ZOCOR) 20 MG tablet Take 1 tablet (20 mg total) by mouth at bedtime. 12/13/22   Duanne Limerick, MD  vitamin E 400 UNIT capsule Take 400 Units by mouth daily.    [provider]    Family History Family History  Problem Relation Age of Onset   Heart disease Mother    Colon cancer Mother    Hypertension Mother    Heart disease Father    Hypertension Father    Breast cancer Neg Hx     Social History Social History   Tobacco Use   Smoking status: Never   Smokeless tobacco: Never  Vaping Use   Vaping status: Never Used  Substance Use Topics   Alcohol use: Not Currently   Drug use: Never     Allergies   Aspirin, Atorvastatin, Macrobid [nitrofurantoin], Nsaids, and Sulfa antibiotics   Review of Systems Review of Systems  Constitutional:  Positive for chills and fatigue. Negative for diaphoresis and fever.  HENT:  Positive for congestion, rhinorrhea and sore throat. Negative for ear pain, sinus pressure and sinus pain.   Respiratory:  Positive for cough. Negative for shortness of  breath.   Gastrointestinal:  Negative for abdominal pain, nausea and vomiting.  Musculoskeletal:  Negative for arthralgias and myalgias.  Skin:  Negative for rash.  Neurological:  Negative for weakness and headaches.  Hematological:  Negative for adenopathy.     Physical Exam Triage Vital Signs ED Triage Vitals [02/10/23 1928]  Encounter Vitals Group     BP      Systolic BP Percentile      Diastolic BP Percentile      Pulse  Resp      Temp      Temp src      SpO2      Weight 151 lb 0.2 oz (68.5 kg)     Height 5\' 5"  (1.651 m)     Head Circumference      Peak Flow      Pain Score 0     Pain Loc      Pain Education      Exclude from Growth Chart    No data found.  Updated Vital Signs BP (!) 140/63 (BP Location: Left Arm)   Pulse (!) 105   Temp 99.1 F (37.3 C) (Oral)   Resp 14   Ht 5\' 5"  (1.651 m)   Wt 151 lb 0.2 oz (68.5 kg)   SpO2 97%   BMI 25.13 kg/m      Physical Exam Vitals and nursing note reviewed.  Constitutional:      General: She is not in acute distress.    Appearance: Normal appearance. She is not ill-appearing or toxic-appearing.  HENT:     Head: Normocephalic and atraumatic.     Nose: Congestion present.     Mouth/Throat:     Mouth: Mucous membranes are moist.     Pharynx: Oropharynx is clear.  Eyes:     General: No scleral icterus.       Right eye: No discharge.        Left eye: No discharge.     Conjunctiva/sclera: Conjunctivae normal.  Cardiovascular:     Rate and Rhythm: Regular rhythm. Tachycardia present.     Heart sounds: Normal heart sounds.  Pulmonary:     Effort: Pulmonary effort is normal. No respiratory distress.     Breath sounds: Normal breath sounds.  Musculoskeletal:     Cervical back: Neck supple.  Skin:    General: Skin is dry.  Neurological:     General: No focal deficit present.     Mental Status: She is alert. Mental status is at baseline.     Motor: No weakness.     Gait: Gait normal.  Psychiatric:         Mood and Affect: Mood normal.        Behavior: Behavior normal.        Thought Content: Thought content normal.      UC Treatments / Results  Labs (all labs ordered are listed, but only abnormal results are displayed) Labs Reviewed  SARS CORONAVIRUS 2 BY RT PCR - Abnormal; Notable for the following components:      Result Value   SARS Coronavirus 2 by RT PCR POSITIVE (*)    All other components within normal limits    EKG   Radiology No results found.  Procedures Procedures (including critical care time)  Medications Ordered in UC Medications - No data to display  Initial Impression / Assessment and Plan / UC Course  I have reviewed the triage vital signs and the nursing notes.  Pertinent labs & imaging results that were available during my care of the patient were reviewed by me and considered in my medical decision making (see chart for details).    77 year old female presents for 2-day history of cough, congestion, chills, fatigue.  Has had a positive home COVID test but request to be tested with a PCR test.  She is afebrile.  Overall well-appearing.  On exam is nasal congestion.  Throat is clear.  Chest clear to auscultation.  COVID test positive.  Reviewed results with patient.  Reviewed current CDC guidelines, isolation protocol and ED precautions.  Discussed use of OTC meds, supportive care and symptom management.  Reviewed return precautions. *** Final Clinical Impressions(s) / UC Diagnoses   Final diagnoses:  COVID-19  Acute cough  Nasal congestion     Discharge Instructions      -COVID test positive.  You should isolate until you are fever free for 24 hours and your symptoms are beginning to improve, about 5 days or so from symptom onset.  Wear a mask as long as you are coughing, this could be an additional 5 days. -Simvastatin should be discontinued for at least 12 hours prior to starting nirmatrelvir/ritonavir, during the 5 days of therapy with  nirmatrelvir/ritonavir and for an additional 5 days after nirmatrelvir/ritonavir therapy has been completed.  - May use prescription cough medication as prescribed or OTC Robitussin, Mucinex.  Make sure to increase rest and fluids.  Tylenol for any fever. - You should be seen again if you have uncontrollable fever, weakness or trouble breathing.     ED Prescriptions     Medication Sig Dispense Auth. Provider   nirmatrelvir/ritonavir (PAXLOVID) 20 x 150 MG & 10 x 100MG  TABS Take 3 tablets by mouth 2 (two) times daily for 5 days. Patient GFR is over 60. Take nirmatrelvir (150 mg) two tablets twice daily for 5 days and ritonavir (100 mg) one tablet twice daily for 5 days. 30 tablet Gareth Morgan      PDMP not reviewed this encounter.

## 2023-03-07 ENCOUNTER — Telehealth: Payer: Self-pay

## 2023-03-07 NOTE — Telephone Encounter (Signed)
Actually, she probably needs to hold on everything to the vagina and try coating with vaseline or coconut oil for the next few days, as some of the stronger treatments (including steroids may actually make it worse instead of better).

## 2023-03-07 NOTE — Telephone Encounter (Signed)
Pt called the after hour nurse today at 11:06am; states she is having vaginal itching and her labia are really red.  This is an ongoing thing with her.  Sometimes better, sometimes worse.  She was rx'd a vaginal cream that she is to use a 1/4 of a syringe every other day.  She has been putting Neosporin on the labia but is wanting to know if there is something stronger she can use.  [phone]  After hour nurse adv pt to be seen; pt states she is not wanting to come in again for this.  She said she had a lot of tests done already.  This is ongoing.  Would like something else rx'd or instructions from Maralyn Sago, who she saw the last time.

## 2023-03-08 NOTE — Telephone Encounter (Signed)
Pt aware and appreciative  

## 2023-03-14 DIAGNOSIS — H903 Sensorineural hearing loss, bilateral: Secondary | ICD-10-CM | POA: Diagnosis not present

## 2023-03-15 ENCOUNTER — Ambulatory Visit (INDEPENDENT_AMBULATORY_CARE_PROVIDER_SITE_OTHER): Payer: Medicare Other

## 2023-03-15 ENCOUNTER — Telehealth: Payer: Self-pay

## 2023-03-15 DIAGNOSIS — N94818 Other vulvodynia: Secondary | ICD-10-CM

## 2023-03-15 DIAGNOSIS — N905 Atrophy of vulva: Secondary | ICD-10-CM | POA: Diagnosis not present

## 2023-03-15 MED ORDER — ESTRADIOL 0.1 MG/GM VA CREA: 0.2500 | TOPICAL_CREAM | VAGINAL | 3 refills | Status: DC

## 2023-03-15 NOTE — Telephone Encounter (Signed)
Pt called triage and wants to know if she can use the estradiol cream more than one time per day since she will not be using the applicators?

## 2023-03-15 NOTE — Progress Notes (Signed)
   GYN ENCOUNTER  Encounter for vaginal irritation  Subjective  HPI: Melissa Daniels is a 77 y.o. O1H0865 who presents today for vaginal irritation.   Seen in June for symptoms of vulvar irritation, redness and itching. Was prescribed Estrace cream at that time and was using regularly with good resolution of symptoms. Stopped using once symptoms resolved.   Symptoms returned over the weekend. Started using Estrace cream again over weekend and Neosporin but continued to have burning. Stopped using when she called the clinic triage earlier this week and has just been using vaseline. Initially didn't help but feels better now in clinic.   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   Neuropathy    bilateral feet   Osteoporosis    Vertigo    several episodes per year   Past Surgical History:  Procedure Laterality Date   BREAST BIOPSY Left 12/28/2016   stereo path pend   BREAST LUMPECTOMY     COLONOSCOPY WITH PROPOFOL N/A 09/08/2017   Procedure: COLONOSCOPY WITH PROPOFOL;  Surgeon: Midge Minium, MD;  Location: [facility];  Service: Endoscopy;  Laterality: N/A;   COLONOSCOPY WITH PROPOFOL N/A 09/03/2020   Procedure: COLONOSCOPY WITH PROPOFOL;  Surgeon: Midge Minium, MD;  Location: [facility];  Service: Endoscopy;  Laterality: N/A;  priority 4   EYE SURGERY     POLYPECTOMY  09/08/2017   Procedure: POLYPECTOMY;  Surgeon: Midge Minium, MD;  Location: [facility];  Service: Endoscopy;;   SPLENECTOMY, TOTAL     OB History     Gravida  3   Para  3   Term  3   Preterm      AB      Living  3      SAB      IAB      Ectopic      Multiple      Live Births             Allergies  Allergen Reactions   Aspirin     Atorvastatin    Macrobid [Nitrofurantoin]    Nsaids Other (See Comments)    History of ITP, 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, 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    Sulfa Antibiotics Other (See Comments)    Platelets drop     Review of Systems  12 point ROS negative except for pertinent positives noted in HPO above.   Objective  BP 121/69   Pulse 85   Ht 5\' 6"  (1.676 m)   Wt 154 lb (69.9 kg)   BMI 24.86 kg/m   Physical examination GENERAL APPEARANCE: alert, well appearing LUNGS: normal work of breathing HEART: normal heart rate VULVA:  vulvar erythema with mild excoriation noted bilaterally on labia majora, mild erythema noted across majority of vulva.   Assessment/Plan - Recommended restarting Estrace cream as it provided symptom relief in June. Will use nightly for two weeks and then every other day after that. Can also use Aquaphor or Vaseline for symptom relief. - Will follow up with MD if symptoms do not resolve.   Melissa Daniels, CNM  03/15/23 10:24 AM

## 2023-03-15 NOTE — Telephone Encounter (Signed)
Pt aware.

## 2023-03-24 ENCOUNTER — Encounter: Payer: Self-pay | Admitting: Family Medicine

## 2023-03-31 ENCOUNTER — Encounter: Payer: Self-pay | Admitting: Family Medicine

## 2023-04-03 ENCOUNTER — Telehealth: Payer: Self-pay

## 2023-04-03 DIAGNOSIS — H4922 Sixth [abducent] nerve palsy, left eye: Secondary | ICD-10-CM | POA: Diagnosis not present

## 2023-04-03 DIAGNOSIS — H518 Other specified disorders of binocular movement: Secondary | ICD-10-CM | POA: Diagnosis not present

## 2023-04-03 DIAGNOSIS — H532 Diplopia: Secondary | ICD-10-CM | POA: Diagnosis not present

## 2023-04-03 DIAGNOSIS — H5000 Unspecified esotropia: Secondary | ICD-10-CM | POA: Diagnosis not present

## 2023-04-03 NOTE — Telephone Encounter (Signed)
Pt calling; is still having the same sxs (vulvar irritation and itching) she was seen for as well as burning of the labia.  Believes she needs to see an MD per SF.  Adv will send msg to schedulers so watch for their call. Pt states we usually get her in pretty quickly when she is this irritated.

## 2023-04-04 NOTE — Telephone Encounter (Signed)
The patient is scheduled 04/05/23 with Dr Lonny Prude

## 2023-04-05 ENCOUNTER — Ambulatory Visit (INDEPENDENT_AMBULATORY_CARE_PROVIDER_SITE_OTHER): Payer: Medicare Other | Admitting: Obstetrics

## 2023-04-05 ENCOUNTER — Encounter: Payer: Self-pay | Admitting: Obstetrics

## 2023-04-05 ENCOUNTER — Telehealth: Payer: Self-pay

## 2023-04-05 VITALS — BP 120/80 | HR 89 | Ht 66.0 in | Wt 159.0 lb

## 2023-04-05 DIAGNOSIS — L309 Dermatitis, unspecified: Secondary | ICD-10-CM

## 2023-04-05 MED ORDER — CLOTRIMAZOLE 1 % EX OINT: TOPICAL_OINTMENT | CUTANEOUS | 0 refills | Status: DC

## 2023-04-05 MED ORDER — TRIAMCINOLONE ACETONIDE 0.025 % EX OINT: TOPICAL_OINTMENT | CUTANEOUS | 0 refills | Status: DC

## 2023-04-05 NOTE — Telephone Encounter (Addendum)
Pt ware.  Pt forgot to ask if MR would rx Semaglutide for her.

## 2023-04-05 NOTE — Patient Instructions (Signed)
-  Cleanse the areas with only fingers and use soap that has no fragrance or dyes in it. Dove for sensitive skin is a good option.  -After showering/bathing, or toileting, pat the tissues dry, no wiping or rubbing.  -On dry tissues, apply the Clotrimazole first, then the Triamcinolone. During this time, do not use Estradiol cream. -It may take several weeks to fully return to normal. Have patience and continue the ointments.  -Call the clinic if it gets worse.

## 2023-04-05 NOTE — Progress Notes (Signed)
    GYNECOLOGY PROGRESS NOTE  Subjective:  PCP: Melissa Limerick, MD  Patient ID: Consepcion Daniels, female    DOB: [birth year], 77 y.o.   MRN: [id number]  HPI  Patient is a 77 y.o. G70P3003 female who presents for vulvar itching for several weeks. She reports the Estradiol cream does not help. The tissues feel so dry they hurt, but mostly it just itches. Has also been using Vaseline for the dryness. No new soaps, clothing, lotions. Pats dry with toileting. Does scratch, trys to minimize, and she examines with mirror daily. Reports no lesions or discharge. Denies any vaginal symptoms, this is all external.   The following portions of the patient's history were reviewed and updated as appropriate: allergies, current medications, past family history, past medical history, past social history, past surgical history, and problem list.  Review of Systems Pertinent items are noted in HPI.   Objective:   Blood pressure 120/80, pulse 89, height 5\' 6"  (1.676 m), weight 159 lb (72.1 kg). Body mass index is 25.66 kg/m.  General appearance: alert and cooperative Abdomen: soft, non-tender; bowel sounds normal; no masses,  no organomegaly Pelvic: vulva with demarcated, confluent erythema on bilat labia and buttocks, no ulcerations or lesions, no exudate. See images Extremities: extremities normal, atraumatic, no cyanosis or edema Neurologic: Grossly normal  Assessment/Plan:   1. 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 Images documented in chart.  -Discussed stopping the Estradiol for now, with potential resumption at a later time, as she does have some atrophy.  -Rx for both Clotrimazole and Triamcinolone, use scheduled BID for the next 3 wks. -If no improvement at 3wk follow up, will biopsy tissues for further  identification.  -Call clinic if worsening. Otherwise, see patient in 3wks.    Melissa Manson, DO [facility]

## 2023-04-05 NOTE — Telephone Encounter (Signed)
Pt calling; her pharm dies not have the Triamcinilone  0.025% ointment but can have it in tomorrow.  Pt wants to know if okay to use the clotrimazole 1% ointment by itself today.  [phone]

## 2023-04-05 NOTE — Telephone Encounter (Signed)
Pt aware.

## 2023-04-05 NOTE — Telephone Encounter (Signed)
Yes that is fine

## 2023-04-06 ENCOUNTER — Ambulatory Visit
Admission: RE | Admit: 2023-04-06 | Discharge: 2023-04-06 | Disposition: A | Discharge status: Home / Self Care | Payer: Medicare Other | Source: Ambulatory Visit | Attending: Family Medicine | Admitting: Family Medicine

## 2023-04-06 ENCOUNTER — Encounter: Payer: Self-pay | Admitting: Family Medicine

## 2023-04-06 ENCOUNTER — Ambulatory Visit (INDEPENDENT_AMBULATORY_CARE_PROVIDER_SITE_OTHER): Payer: Medicare Other | Admitting: Family Medicine

## 2023-04-06 ENCOUNTER — Ambulatory Visit
Admission: RE | Admit: 2023-04-06 | Discharge: 2023-04-06 | Disposition: A | Discharge status: Home / Self Care | Payer: Medicare Other | Attending: Family Medicine | Admitting: Family Medicine

## 2023-04-06 VITALS — BP 124/78 | HR 87 | Ht 66.0 in | Wt 159.4 lb

## 2023-04-06 DIAGNOSIS — M79642 Pain in left hand: Secondary | ICD-10-CM

## 2023-04-06 DIAGNOSIS — F410 Panic disorder [episodic paroxysmal anxiety] without agoraphobia: Secondary | ICD-10-CM | POA: Diagnosis not present

## 2023-04-06 DIAGNOSIS — M79641 Pain in right hand: Secondary | ICD-10-CM | POA: Diagnosis not present

## 2023-04-06 DIAGNOSIS — S63002A Unspecified subluxation of left wrist and hand, initial encounter: Secondary | ICD-10-CM | POA: Diagnosis not present

## 2023-04-06 DIAGNOSIS — M19042 Primary osteoarthritis, left hand: Secondary | ICD-10-CM | POA: Diagnosis not present

## 2023-04-06 DIAGNOSIS — M778 Other enthesopathies, not elsewhere classified: Secondary | ICD-10-CM | POA: Diagnosis not present

## 2023-04-06 DIAGNOSIS — S63001A Unspecified subluxation of right wrist and hand, initial encounter: Secondary | ICD-10-CM | POA: Diagnosis not present

## 2023-04-06 MED ORDER — ALPRAZOLAM 0.25 MG PO TABS: 0.2500 mg | ORAL_TABLET | Freq: Two times a day (BID) | ORAL | 0 refills | Status: AC | PRN

## 2023-04-06 NOTE — Progress Notes (Signed)
Date:  04/06/2023   Name:  Melissa Daniels   DOB:  1945/07/20   MRN:  [id number]   Chief Complaint: Hand Pain (Patient wants to discuss seeing a rheumatologist for her hand pain. Both hands, knees and feet. Started a year ago. )  Hand   Hand Pain  Incident onset: over a a year. There was no injury mechanism. The pain is present in the right hand and left hand. The quality of the pain is described as aching. The pain is moderate. The pain has been Constant since the incident. Pertinent negatives include no chest pain, muscle weakness, numbness or tingling. Exacerbated by: repetitive use of hands. She has tried acetaminophen for the symptoms. The treatment provided moderate relief.  Anxiety Presents for initial (problem has resurfaved) visit. Symptoms include excessive worry and nervous/anxious behavior. Patient reports no chest pain, palpitations or shortness of breath.      Lab Results  Component Value Date   NA 138 06/13/2022   K 4.2 06/13/2022   CO2 31 06/13/2022   GLUCOSE 99 06/13/2022   BUN 17 06/13/2022   CREATININE 0.71 06/13/2022   CALCIUM 9.3 06/13/2022   EGFR 69 12/08/2021   GFRNONAA >60 06/13/2022   Lab Results  Component Value Date   CHOL 220 (H) 12/12/2022   HDL 54 12/12/2022   LDLCALC 127 (H) 12/12/2022   TRIG 219 (H) 12/12/2022   CHOLHDL 4.1 06/13/2022   Lab Results  Component Value Date   TSH 2.850 11/08/2022   No results found for: "HGBA1C" Lab Results  Component Value Date   WBC 5.9 12/12/2022   HGB 13.1 12/12/2022   HCT 38.5 12/12/2022   MCV 91 12/12/2022   PLT 274 12/12/2022   Lab Results  Component Value Date   ALT 21 06/13/2022   AST 27 06/13/2022   ALKPHOS 84 06/13/2022   BILITOT 0.8 06/13/2022   No results found for: "25OHVITD2", "25OHVITD3", "VD25OH"   Review of Systems  Eyes:  Negative for visual disturbance.  Respiratory:  Negative for chest tightness, shortness of breath and wheezing.   Cardiovascular:  Negative for chest pain,  palpitations and leg swelling.  Gastrointestinal:  Negative for abdominal distention and blood in stool.  Endocrine: Negative for polyuria.  Genitourinary:  Negative for hematuria and vaginal bleeding.  Neurological:  Negative for tingling and numbness.  Psychiatric/Behavioral:  The patient is nervous/anxious.     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   History of colonic polyps    Benign neoplasm of cecum    Benign neoplasm of transverse colon    Benign neoplasm of ascending colon    Malignant neoplasm of upper-outer quadrant of left breast in female, estrogen receptor positive (HCC) 01/10/2017   Neuropathy 10/31/2016   Taking medication for chronic disease 10/31/2016   Primary osteoarthritis of right hip 10/31/2016   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HCC) 10/19/2015   Menopause 10/19/2015   Hormone replacement therapy (HRT) 10/19/2015   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   Routine general medical examination at a health care facility  10/10/2014   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   Female stress incontinence 10/10/2014   Colon polyp 10/10/2014   Episodic paroxysmal anxiety disorder 10/10/2014    Allergies  Allergen Reactions   Aspirin    Atorvastatin    Macrobid [Nitrofurantoin]    Nsaids Other (See Comments)    History of ITP, 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, 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    Sulfa Antibiotics Other (See Comments)    Platelets drop     Past Surgical History:   Procedure Laterality Date   BREAST BIOPSY Left 12/28/2016   stereo path pend   BREAST LUMPECTOMY     COLONOSCOPY WITH PROPOFOL N/A 09/08/2017   Procedure: COLONOSCOPY WITH PROPOFOL;  Surgeon: Midge Minium, MD;  Location: [facility];  Service: Endoscopy;  Laterality: N/A;   COLONOSCOPY WITH PROPOFOL N/A 09/03/2020   Procedure: COLONOSCOPY WITH PROPOFOL;  Surgeon: Midge Minium, MD;  Location: [facility];  Service: Endoscopy;  Laterality: N/A;  priority 4   EYE SURGERY     POLYPECTOMY  09/08/2017   Procedure: POLYPECTOMY;  Surgeon: Midge Minium, MD;  Location: [facility];  Service: Endoscopy;;   SPLENECTOMY, TOTAL      Social History   Tobacco Use   Smoking status: Never   Smokeless tobacco: Never  Vaping Use   Vaping status: Never Used  Substance Use Topics   Alcohol use: Not Currently   Drug use: Never     Medication list has been reviewed and updated.  Current Meds  Medication Sig   ALPRAZolam (XANAX) 0.25 MG tablet TAKE (1) TABLET BY MOUTH EVERY DAY AS NEEDED   Cholecalciferol (VITAMIN D3) 25 MCG (1000 UT) CAPS Take 1 capsule by mouth daily.   Clotrimazole 1 % OINT Apply fingertip amount to affected areas in the morning and at bedtime   DULoxetine (CYMBALTA) 60 MG capsule Take 60 mg by mouth daily.   estradiol (ESTRACE) 0.1 MG/GM vaginal cream Place 0.25 Applicatorfuls vaginally every other day.   gabapentin (NEURONTIN) 300 MG capsule Take 1 capsule (300 mg total) by mouth at bedtime. (Patient taking differently: Take 300 mg by mouth in the morning and at bedtime. [facility] doctor- morning and night)   Multiple Vitamins-Minerals (OCUVITE ADULT 50+ PO) Take 1 tablet by mouth in the morning and at bedtime.   oxybutynin (DITROPAN-XL) 5 MG 24 hr tablet TAKE (1) TABLET BY MOUTH DAILY AT BEDTIME   simvastatin (ZOCOR) 20 MG tablet Take 1 tablet (20 mg total) by mouth at bedtime.   triamcinolone (KENALOG) 0.025 % ointment Apply fingertip amount to affected  areas in the morning and at bedtime   vitamin E 400 UNIT capsule Take 400 Units by mouth daily.       04/06/2023    3:18 PM 12/12/2022    8:29 AM 10/17/2022    9:41 AM 06/13/2022    9:09 AM  GAD 7 : Generalized Anxiety Score  Nervous, Anxious, on Edge 2 0 0 0  Control/stop worrying 1 0 0 0  Worry too much - different things 2 0 0 0  Trouble relaxing 0 0 0 0  Restless 0 0 0 0  Easily annoyed or irritable 0 0 0 0  Afraid - awful might happen 0 0 0 0  Total GAD 7 Score 5 0 0 0  Anxiety Difficulty Not difficult at all Not difficult at all Not difficult at all Not difficult at all       04/06/2023  3:18 PM 12/12/2022    8:29 AM 10/17/2022    9:40 AM  Depression screen PHQ 2/9  Decreased Interest 3 0 0  Down, Depressed, Hopeless 2 0 0  PHQ - 2 Score 5 0 0  Altered sleeping 0 0 0  Tired, decreased energy 1 0 0  Change in appetite 0 0 0  Feeling bad or failure about yourself  0 0 0  Trouble concentrating 0 0 0  Moving slowly or fidgety/restless 0 0 0  Suicidal thoughts 0 0 0  PHQ-9 Score 6 0 0  Difficult doing work/chores Not difficult at all Not difficult at all Not difficult at all    BP Readings from Last 3 Encounters:  04/06/23 124/78  04/05/23 120/80  03/15/23 121/69    Physical Exam Vitals and nursing note reviewed. Exam conducted with a chaperone present.  Constitutional:      General: She is not in acute distress.    Appearance: She is not diaphoretic.  HENT:     Head: Normocephalic and atraumatic.     Right Ear: External ear normal.     Left Ear: External ear normal.     Nose: Nose normal.  Eyes:     General:        Right eye: No discharge.        Left eye: No discharge.     Conjunctiva/sclera: Conjunctivae normal.     Pupils: Pupils are equal, round, and reactive to light.  Neck:     Thyroid: No thyromegaly.     Vascular: No JVD.  Cardiovascular:     Rate and Rhythm: Normal rate and regular rhythm.     Heart sounds: Normal heart sounds. No murmur  heard.    No friction rub. No gallop.  Pulmonary:     Effort: Pulmonary effort is normal.     Breath sounds: Normal breath sounds. No wheezing or rhonchi.  Abdominal:     General: Bowel sounds are normal.     Palpations: Abdomen is soft. There is no mass.     Tenderness: There is no abdominal tenderness. There is no guarding.  Musculoskeletal:        General: Normal range of motion.     Right hand: Tenderness present. Normal range of motion.     Left hand: Tenderness present. Normal range of motion.     Cervical back: Normal range of motion and neck supple.     Comments: Tender in PIP and MP jts bilateral  Lymphadenopathy:     Cervical: No cervical adenopathy.  Skin:    General: Skin is warm and dry.  Neurological:     Mental Status: She is alert.     Deep Tendon Reflexes: Reflexes are normal and symmetric.     Wt Readings from Last 3 Encounters:  04/06/23 159 lb 6.4 oz (72.3 kg)  04/05/23 159 lb (72.1 kg)  03/15/23 154 lb (69.9 kg)    BP 124/78   Pulse 87   Ht 5\' 6"  (1.676 m)   Wt 159 lb 6.4 oz (72.3 kg)   SpO2 98%   BMI 25.73 kg/m   Assessment and Plan: 1. Bilateral hand pain Chronic.  Persistent.  Episodic flares.  Primarily involving the PIP and MP joints.  Tenderness and swelling is noted of the cells that could be consistent with a diagnosis of rheumatoid arthritis.  We will obtain a sed rate ANA and rheumatoid factor initially and we will get REFUSED to take a look at  the degree of arthritis and whether this appears to be more osteo or rheumatoid in nature.  In the meantime I have encouraged patient to continue her Tylenol and will await results of the x-ray and labs prior to considering referral to rheumatology. - Sed Rate (ESR) - ANA Direct w/Reflex if Positive - Rheumatoid factor - DG HANDS 1 VIEW BILAT BALLCATCHERS; Future  2. Episodic paroxysmal anxiety disorder Patient also brings up given all the circumstances particularly with her vaginal dermatitis that  she would like to resume on an as-needed basis her alprazolam.  Chart was reviewed and PMP AWARxE was reviewed with only 1 provider in the last year giving Xanax and that was November of last year and only other medication of concern would be gabapentin which was reviewed as well. - ALPRAZolam (XANAX) 0.25 MG tablet; Take 1 tablet (0.25 mg total) by mouth 2 (two) times daily as needed for anxiety.  Dispense: 30 tablet; Refill: 0     Elizabeth Sauer, MD

## 2023-04-07 LAB — ANA W/REFLEX IF POSITIVE: Anti Nuclear Antibody (ANA): NEGATIVE

## 2023-04-07 LAB — RHEUMATOID FACTOR: Rheumatoid fact SerPl-aCnc: <10 [IU]/mL (ref <14.0)

## 2023-04-07 LAB — SEDIMENTATION RATE: Sed Rate: 9 mm/h (ref 0–40)

## 2023-04-11 ENCOUNTER — Telehealth: Payer: Self-pay | Admitting: Family Medicine

## 2023-04-11 NOTE — Telephone Encounter (Signed)
Results not back yet will call when ready.  KP

## 2023-04-11 NOTE — Telephone Encounter (Signed)
Pt called in for results of xray of her hands. Please cb when available

## 2023-04-14 NOTE — Telephone Encounter (Signed)
Results are not back yet. Will call when we get the results.  KP

## 2023-04-14 NOTE — Telephone Encounter (Signed)
Pt is calling to request results. Pt is stating that she has been waiting over a week. Please advise

## 2023-04-21 ENCOUNTER — Telehealth: Payer: Self-pay | Admitting: Family Medicine

## 2023-04-21 NOTE — Telephone Encounter (Signed)
Pt is calling in for her imaging results of the xrays done on her hands. Pt said it had been two weeks and she still hasn't heard anything. Reached out to imaging who stated that there is a delay in the resulting of images and that it could take up to 21 days from the date of imaging for results. Pt says she will call back after that timeframe.

## 2023-04-25 ENCOUNTER — Telehealth: Payer: Self-pay | Admitting: Family Medicine

## 2023-04-25 NOTE — Telephone Encounter (Signed)
The patient has made an additional phone call requesting contact with a member of clinical staff regarding their results   Please contact when possible

## 2023-04-25 NOTE — Telephone Encounter (Signed)
Patient is calling again to get results from her xrays. We do not have results yet. When results come in, someone will contact patient.

## 2023-04-25 NOTE — Telephone Encounter (Signed)
Patient informed results are not back yet and we will call her once they return.  - Melissa Daniels

## 2023-04-26 ENCOUNTER — Ambulatory Visit: Payer: Medicare Other | Admitting: Obstetrics

## 2023-04-26 ENCOUNTER — Encounter: Payer: Self-pay | Admitting: Obstetrics

## 2023-04-26 VITALS — BP 120/80 | HR 88 | Ht 65.0 in | Wt 157.0 lb

## 2023-04-26 DIAGNOSIS — L309 Dermatitis, unspecified: Secondary | ICD-10-CM | POA: Diagnosis not present

## 2023-04-26 NOTE — Progress Notes (Signed)
    GYNECOLOGY PROGRESS NOTE  Subjective:  PCP: Duanne Limerick, MD  Patient ID: Consepcion Hearing, female    DOB: [birth year], 77 y.o.   MRN: [id number]  HPI  Patient is a 77 y.o. G96P3003 female who presents for 3wk follow up on vulvar dermatitis. Pt was started on Clotrimazole and Triamcinolone, has been using BID as directed and reports the redness is better and the itching and pain is gone.   The following portions of the patient's history were reviewed and updated as appropriate: allergies, current medications, past family history, past medical history, past social history, past surgical history, and problem list.  Review of Systems Pertinent items are noted in HPI.   Objective:   Blood pressure 120/80, pulse 88, height 5\' 5"  (1.651 m), weight 157 lb (71.2 kg). Body mass index is 26.13 kg/m.  General appearance: alert and cooperative Abdomen: soft, non-tender; bowel sounds normal; no masses,  no organomegaly Pelvic: external genitalia with mild erythema, no longer demarcated. Non-tender, no ulcers or lesions. Vastly improved from prior.  Extremities: extremities normal, atraumatic, no cyanosis or edema Neurologic: Grossly normal  Assessment/Plan:   1. Vulvar dermatitis   77yo G3P3003 initially with several months of chronic vulvar dermatitis, now almost resolved after treating with Clotrimazole and Triamcinolone.  -Continue both creams for two more weeks for full resolution, then continue on a prn basis, try to initiate at the beginning of a flare.  -If using creams for 1 week without any improvement, or worsening, should notify clinic and be evaluated.  -Follow up prn    Julieanne Manson, DO [facility]

## 2023-04-26 NOTE — Progress Notes (Signed)
Pt aware. Pt has an appt with Dr. Ashley Royalty.  KP

## 2023-04-26 NOTE — Telephone Encounter (Signed)
Spoke with patient about XR results per Dr Yetta Barre. She has bone on bone in all 5 fingers and bone spurring Recommended orthopedics or sports medicine.  She would like to see Dr Ashley Royalty but will call back to schedule this appt when she has her calendar.  - Melissa Daniels

## 2023-05-01 ENCOUNTER — Ambulatory Visit (INDEPENDENT_AMBULATORY_CARE_PROVIDER_SITE_OTHER): Payer: Medicare Other | Admitting: Family Medicine

## 2023-05-01 ENCOUNTER — Encounter: Payer: Self-pay | Admitting: Family Medicine

## 2023-05-01 ENCOUNTER — Telehealth: Payer: Self-pay | Admitting: Family Medicine

## 2023-05-01 VITALS — BP 118/80 | HR 80 | Ht 65.0 in | Wt 158.0 lb

## 2023-05-01 DIAGNOSIS — M25562 Pain in left knee: Secondary | ICD-10-CM | POA: Diagnosis not present

## 2023-05-01 DIAGNOSIS — M19049 Primary osteoarthritis, unspecified hand: Secondary | ICD-10-CM | POA: Insufficient documentation

## 2023-05-01 DIAGNOSIS — M1711 Unilateral primary osteoarthritis, right knee: Secondary | ICD-10-CM

## 2023-05-01 MED ORDER — OXYCODONE-ACETAMINOPHEN 5-325 MG PO TABS: ORAL_TABLET | ORAL | 0 refills | Status: DC

## 2023-05-01 MED ORDER — TRAMADOL HCL 50 MG PO TABS: 50.0000 mg | ORAL_TABLET | Freq: Three times a day (TID) | ORAL | 0 refills | Status: AC | PRN

## 2023-05-01 NOTE — Assessment & Plan Note (Signed)
See additional assessment(s) for plan details. 

## 2023-05-01 NOTE — Progress Notes (Signed)
Primary Care / Sports Medicine Office Visit  Patient Information:  Patient ID: Melissa Daniels, female DOB: Aug 30, 1945 Age: 77 y.o. MRN: [id number]   Melissa Daniels is a pleasant 77 y.o. female presenting with the following:  Chief Complaint  Patient presents with   Hand Burn    Patient presents today for Bil hand pain. She has had this pain for 9 months. Patient has difficulty picking up heavy items and her hands ache constantly. Patient denies any therapy or injection for her hand pain. She takes Cymbalta, gabapentin, and Tylenol for pain purposes. Patient states she was told at [facility] she has neuropathy in both of her hands.    Knee Pain    Patient has had bil knee pain for about 12 months. Her right knee pops occasionally, aches and getting stiff. Her left knee is also stiff and achy. She states she has neuropathy in both feet.     Vitals:   05/01/23 0852  BP: 118/80  Pulse: 80  SpO2: 95%   Vitals:   05/01/23 0852  Weight: 158 lb (71.7 kg)  Height: 5\' 5"  (1.651 m)   Body mass index is 26.29 kg/m.     Independent interpretation of notes and tests performed by another provider:   RADIOLOGY Bilateral hand X-ray: Decreased joint space first CMC, DIP is diffusely. Osteopenia noted in both hands.  Procedures performed:   None  Pertinent History, Exam, Impression, and Recommendations:   Problem List Items Addressed This Visit       Musculoskeletal and Integument   Primary osteoarthritis of right knee - Primary    Melissa Daniels, a patient with a history of ITP, presents with bilateral knee and hand pain. The pain in her knees is more pronounced on the right side, making it difficult for her to walk and climb stairs. She reports that the pain in her knees feels like tightness and that her right knee pops. The patient has been using Voltaren gel and Aspercreme for pain relief, but these have not provided significant relief. She received a cortisone shot for right knee pain two years  ago, which she believes provided some relief.  Physical Exam MUSCULOSKELETAL:  Bilateral knees generalized swelling without effusion, right knee with lateral patellar facet tenderness, medial and lateral joint line tenderness, secondarily at the pes anserine bursa, no laxity with anterior/posterior and valgus/varus stressing, range of motion 0-1 20 limited by anterior pain  Left knee tenderness at the lateral and medial patellar facets, pes anserine bursa, joint lines nontender, no laxity.  Range of motion 0-1 25   Osteoarthritis of bilateral Knees Bilateral knee pain, worse on the right, with difficulty walking and climbing stairs. Pain is primarily localized about the patellofemoral articulation bilaterally, with additional involvement of the tibiofemoral articulations on the right.  Previous cortisone injection in the right knee was beneficial. -Plan for cortisone injections in both knees, we will schedule close follow-up for procedure. -Continue use of Voltaren gel on knees until injections. 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 The pain in her hands is located around the base of her thumb areas, and she reports weakness in all fingers. The patient has been using Voltaren gel and  Aspercreme for pain relief, but these have not provided significant relief.  Denies any major issues with paresthesias throughout the hands.  Examination demonstrates localized tenderness at the first [facility] joints bilaterally which recreates her stated symptoms.  Positive basal grind.  Sensorimotor intact.  CMC osteoarthritis of bilateral hands Pain in the thumbs and weakness in all fingers. X-rays show  arthritis. -Plan for cortisone injections bilateral first CMC -Prescribe Tramadol for pain control as needed, with caution regarding potential constipation side effect.      Relevant Medications   traMADol (ULTRAM) 50 MG tablet   oxyCODONE-acetaminophen (PERCOCET/ROXICET) 5-325 MG tablet     Other   Arthralgia of left knee    See additional assessment(s) for plan details.        Orders & Medications Medications:  Meds ordered this encounter  Medications   traMADol (ULTRAM) 50 MG tablet    Sig: Take 1 tablet (50 mg total) by mouth every 8 (eight) hours as needed for up to 5 days.    Dispense:  15 tablet    Refill:  0   oxyCODONE-acetaminophen (PERCOCET/ROXICET) 5-325 MG tablet    Sig: Take 1 tablet 30 minutes prior to procedure    Dispense:  1 tablet    Refill:  0   No orders of the defined types were placed in this encounter.    No follow-ups on file.     Melissa Banana, MD, [facility]   Primary Care Sports Medicine Primary Care and Sports Medicine at [facility]

## 2023-05-01 NOTE — Assessment & Plan Note (Signed)
History of Present Illness [name], a patient with a history of ITP, presents with bilateral knee and hand pain. The pain in her hands is located around the base of her thumb areas, and she reports weakness in all fingers. The patient has been using Voltaren gel and Aspercreme for pain relief, but these have not provided significant relief.  Denies any major issues with paresthesias throughout the hands.  Examination demonstrates localized tenderness at the first [facility] joints bilaterally which recreates her stated symptoms.  Positive basal grind.  Sensorimotor intact.  CMC osteoarthritis of bilateral hands Pain in the thumbs and weakness in all fingers. X-rays show arthritis. -Plan for cortisone injections bilateral first CMC -Prescribe Tramadol for pain control as needed, with caution regarding potential constipation side effect.

## 2023-05-01 NOTE — Assessment & Plan Note (Addendum)
Melissa Daniels, a patient with a history of ITP, presents with bilateral knee and hand pain. The pain in her knees is more pronounced on the right side, making it difficult for her to walk and climb stairs. She reports that the pain in her knees feels like tightness and that her right knee pops. The patient has been using Voltaren gel and Aspercreme for pain relief, but these have not provided significant relief. She received a cortisone shot for right knee pain two years ago, which she believes provided some relief.  Physical Exam MUSCULOSKELETAL:  Bilateral knees generalized swelling without effusion, right knee with lateral patellar facet tenderness, medial and lateral joint line tenderness, secondarily at the pes anserine bursa, no laxity with anterior/posterior and valgus/varus stressing, range of motion 0-1 20 limited by anterior pain  Left knee tenderness at the lateral and medial patellar facets, pes anserine bursa, joint lines nontender, no laxity.  Range of motion 0-1 25   Osteoarthritis of bilateral Knees Bilateral knee pain, worse on the right, with difficulty walking and climbing stairs. Pain is primarily localized about the patellofemoral articulation bilaterally, with additional involvement of the tibiofemoral articulations on the right.  Previous cortisone injection in the right knee was beneficial. -Plan for cortisone injections in both knees, we will schedule close follow-up for procedure. -Continue use of Voltaren gel on knees until injections. -Interim use of tramadol sparingly

## 2023-05-01 NOTE — Patient Instructions (Addendum)
  Please follow up for cortisone injections. Continue using Voltaren gel on your knees until you receive the cortisone injections. Can use Tramadol as needed for pain, but be cautious of potential constipation. Use Percocet 30 minutes prior to procedure visit for pain control We will notify Dr. Yetta Barre about your osteopenia for further evaluation.

## 2023-05-02 ENCOUNTER — Other Ambulatory Visit (INDEPENDENT_AMBULATORY_CARE_PROVIDER_SITE_OTHER): Payer: Medicare Other | Admitting: Radiology

## 2023-05-02 ENCOUNTER — Ambulatory Visit (INDEPENDENT_AMBULATORY_CARE_PROVIDER_SITE_OTHER): Payer: Medicare Other | Admitting: Family Medicine

## 2023-05-02 VITALS — BP 116/68 | HR 94 | Ht 65.0 in | Wt 158.0 lb

## 2023-05-02 DIAGNOSIS — M19049 Primary osteoarthritis, unspecified hand: Secondary | ICD-10-CM | POA: Diagnosis not present

## 2023-05-02 DIAGNOSIS — M25562 Pain in left knee: Secondary | ICD-10-CM | POA: Diagnosis not present

## 2023-05-02 DIAGNOSIS — M1711 Unilateral primary osteoarthritis, right knee: Secondary | ICD-10-CM | POA: Diagnosis not present

## 2023-05-02 MED ORDER — TRIAMCINOLONE ACETONIDE 40 MG/ML IJ SUSP
40.0000 mg | Freq: Once | INTRAMUSCULAR | Status: AC
  Administered 2023-05-02: 96 mg via INTRA_ARTICULAR

## 2023-05-05 ENCOUNTER — Encounter: Payer: Self-pay | Admitting: Family Medicine

## 2023-05-05 NOTE — Progress Notes (Signed)
Primary Care / Sports Medicine Office Visit  Patient Information:  Patient ID: Melissa Daniels, female DOB: 11/01/1945 Age: 77 y.o. MRN: [id number]   Melissa Daniels is a pleasant 77 y.o. female presenting with the following:  Chief Complaint  Patient presents with   Injections    Bil knee injection , Bil CMC joint inj    Vitals:   05/02/23 1407  BP: 116/68  Pulse: 94  SpO2: 100%   Vitals:   05/02/23 1407  Weight: 158 lb (71.7 kg)  Height: 5\' 5"  (1.651 m)   Body mass index is 26.29 kg/m.     Independent interpretation of notes and tests performed by another provider:   None  Procedures performed:   Procedure:  Injection of right knee under ultrasound guidance. Ultrasound guidance utilized for anteromedial approach to right knee, no sonographic abnormalities Samsung HS60 device utilized with permanent recording / reporting. Verbal informed consent obtained and verified. Skin prepped in a sterile fashion. Ethyl chloride for topical local analgesia.  Completed without difficulty and tolerated well. Medication: triamcinolone acetonide 40 mg/mL suspension for injection 1 mL total and 2 mL lidocaine 1% without epinephrine utilized for needle placement anesthetic Advised to contact for fevers/chills, erythema, induration, drainage, or persistent bleeding.  Procedure:  Injection of left knee under ultrasound guidance. Ultrasound guidance utilized for anteromedial approach to the left knee, no effusion Samsung HS60 device utilized with permanent recording / reporting. Verbal informed consent obtained and verified. Skin prepped in a sterile fashion. Ethyl chloride for topical local analgesia.  Completed without difficulty and tolerated well. Medication: triamcinolone acetonide 40 mg/mL suspension for injection 1 mL total and 2 mL lidocaine 1% without epinephrine utilized for needle placement anesthetic Advised to contact for fevers/chills, erythema, induration, drainage, or  persistent bleeding.  Procedure:  Injection of right first CMC under ultrasound guidance. Ultrasound guidance utilized for out of plane approach, dynamic joint motion visualized Samsung HS60 device utilized with permanent recording / reporting. Verbal informed consent obtained and verified. Skin prepped in a sterile fashion. Ethyl chloride for topical local analgesia.  Completed without difficulty and tolerated well. Medication: triamcinolone acetonide 40 mg/mL suspension for injection 0.5 mL total and 0.25 mL lidocaine 1% without epinephrine utilized for needle placement anesthetic Advised to contact for fevers/chills, erythema, induration, drainage, or persistent bleeding.  Procedure:  Injection of left first CMC under ultrasound guidance. Ultrasound guidance utilized for out of plane approach, subtle cortical spurring sonographically noted Samsung HS60 device utilized with permanent recording / reporting. Verbal informed consent obtained and verified. Skin prepped in a sterile fashion. Ethyl chloride for topical local analgesia.  Completed without difficulty and tolerated well. Medication: triamcinolone acetonide 40 mg/mL suspension for injection 0.5 mL total and 0.25 mL lidocaine 1% without epinephrine utilized for needle placement anesthetic Advised to contact for fevers/chills, erythema, induration, drainage, or persistent bleeding.   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      Relevant Orders   [place] LIMITED JOINT SPACE STRUCTURES UP BILAT   Primary osteoarthritis of right knee - Primary    Resents for scheduled right knee intra-articular injection.      Relevant Orders   [place] LIMITED JOINT  SPACE STRUCTURES LOW BILAT     Other  Arthralgia of left knee    Resents for scheduled left knee intra-articular injection.      Relevant Orders   [place] LIMITED JOINT SPACE STRUCTURES LOW BILAT     Orders & Medications Medications:  Meds ordered this encounter  Medications   triamcinolone acetonide (KENALOG-40) injection 40 mg   Orders Placed This Encounter  Procedures   [place] LIMITED JOINT SPACE STRUCTURES UP BILAT   [place] LIMITED JOINT SPACE STRUCTURES LOW BILAT     No follow-ups on file.     Jerrol Banana, MD, [facility]   Primary Care Sports Medicine Primary Care and Sports Medicine at [facility]

## 2023-05-05 NOTE — Patient Instructions (Signed)
You have just been given a cortisone injection to reduce pain and inflammation. After the injection you may notice immediate relief of pain as a result of the Lidocaine. It is important to rest the area of the injection for 24 to 48 hours after the injection. There is a possibility of some temporary increased discomfort and swelling for up to 72 hours until the cortisone begins to work. If you do have pain, simply rest the joint and use ice. If you can tolerate over the counter medications, you can try Tylenol, Aleve, or Advil for added relief per package instructions.

## 2023-05-05 NOTE — Assessment & Plan Note (Signed)
Presents for scheduled bilateral first CMC injections, tolerated well, reported resolution of pain following injections.

## 2023-05-05 NOTE — Telephone Encounter (Signed)
Incidentally noted osteopenia on recent hand x-rays, FYI

## 2023-05-05 NOTE — Assessment & Plan Note (Signed)
Resents for scheduled left knee intra-articular injection.

## 2023-05-05 NOTE — Assessment & Plan Note (Signed)
Resents for scheduled right knee intra-articular injection.

## 2023-05-29 DIAGNOSIS — Z1231 Encounter for screening mammogram for malignant neoplasm of breast: Secondary | ICD-10-CM | POA: Diagnosis not present

## 2023-06-19 ENCOUNTER — Ambulatory Visit: Payer: Medicare PPO | Admitting: Family Medicine

## 2023-06-19 ENCOUNTER — Encounter: Payer: Self-pay | Admitting: Family Medicine

## 2023-06-19 VITALS — BP 112/74 | HR 109 | Ht 65.0 in | Wt 154.2 lb

## 2023-06-19 DIAGNOSIS — J0100 Acute maxillary sinusitis, unspecified: Secondary | ICD-10-CM

## 2023-06-19 DIAGNOSIS — N393 Stress incontinence (female) (male): Secondary | ICD-10-CM

## 2023-06-19 DIAGNOSIS — E7849 Other hyperlipidemia: Secondary | ICD-10-CM | POA: Diagnosis not present

## 2023-06-19 MED ORDER — AMOXICILLIN 500 MG PO CAPS: 500.0000 mg | ORAL_CAPSULE | Freq: Three times a day (TID) | ORAL | 0 refills | Status: AC

## 2023-06-19 MED ORDER — OXYBUTYNIN CHLORIDE ER 5 MG PO TB24: ORAL_TABLET | ORAL | 0 refills | Status: DC

## 2023-06-19 MED ORDER — SIMVASTATIN 20 MG PO TABS: 20.0000 mg | ORAL_TABLET | Freq: Every day | ORAL | 0 refills | Status: AC

## 2023-06-19 NOTE — Progress Notes (Signed)
Date:  06/19/2023   Name:  Melissa Daniels   DOB:  July 22, 1945   MRN:  [id number]   Chief Complaint: Hyperlipidemia (Patient presents today for a medication refill simvastatin, oxybutynin, Cymbalta, and duloxetine. )  Cough This is a new problem. The current episode started in the past 7 days. The problem has been waxing and waning. The problem occurs constantly. The cough is Productive of purulent sputum (yellow). Associated symptoms include a fever, nasal congestion, rhinorrhea and a sore throat. Pertinent negatives include no chest pain, chills, ear congestion, ear pain, headaches, heartburn, hemoptysis, myalgias, postnasal drip, rash, shortness of breath, sweats, weight loss or wheezing. Associated symptoms comments: Facial pressure. Nothing aggravates the symptoms. She has tried OTC cough suppressant for the symptoms. The treatment provided no relief.  Sinusitis This is a new problem. The current episode started in the past 7 days. The problem has been waxing and waning since onset. The maximum temperature recorded prior to her arrival was 100.4 - 100.9 F. The pain is mild (facial pressure). Associated symptoms include coughing, diaphoresis, sinus pressure and a sore throat. Pertinent negatives include no chills, ear pain, headaches, shortness of breath or sneezing. Past treatments include oral decongestants (alka seltzer plus). The treatment provided mild relief.    Lab Results  Component Value Date   NA 138 06/13/2022   K 4.2 06/13/2022   CO2 31 06/13/2022   GLUCOSE 99 06/13/2022   BUN 17 06/13/2022   CREATININE 0.71 06/13/2022   CALCIUM 9.3 06/13/2022   EGFR 69 12/08/2021   GFRNONAA >60 06/13/2022   Lab Results  Component Value Date   CHOL 220 (H) 12/12/2022   HDL 54 12/12/2022   LDLCALC 127 (H) 12/12/2022   TRIG 219 (H) 12/12/2022   CHOLHDL 4.1 06/13/2022   Lab Results  Component Value Date   TSH 2.850 11/08/2022   No results found for: "HGBA1C" Lab Results  Component  Value Date   WBC 5.9 12/12/2022   HGB 13.1 12/12/2022   HCT 38.5 12/12/2022   MCV 91 12/12/2022   PLT 274 12/12/2022   Lab Results  Component Value Date   ALT 21 06/13/2022   AST 27 06/13/2022   ALKPHOS 84 06/13/2022   BILITOT 0.8 06/13/2022   No results found for: "25OHVITD2", "25OHVITD3", "VD25OH"   Review of Systems  Constitutional:  Positive for diaphoresis and fever. Negative for chills and weight loss.  HENT:  Positive for rhinorrhea, sinus pressure and sore throat. Negative for ear discharge, ear pain, hearing loss, nosebleeds, postnasal drip and sneezing.   Respiratory:  Positive for cough. Negative for hemoptysis, choking, chest tightness, shortness of breath, wheezing and stridor.   Cardiovascular:  Negative for chest pain, palpitations and leg swelling.  Gastrointestinal:  Negative for abdominal pain and heartburn.  Musculoskeletal:  Negative for myalgias.  Skin:  Negative for rash.  Neurological:  Negative for headaches.    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  History of colonic polyps    Benign neoplasm of cecum    Benign neoplasm of transverse colon    Benign neoplasm of ascending colon    Malignant neoplasm of upper-outer quadrant of left breast in female, estrogen receptor positive (HCC) 01/10/2017   Neuropathy 10/31/2016   Taking medication for chronic disease 10/31/2016   Primary osteoarthritis of right hip 10/31/2016    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HCC) 10/19/2015   Menopause 10/19/2015   Hormone replacement therapy (HRT) 10/19/2015   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   Routine general medical examination at a health care facility 10/10/2014   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   Female stress incontinence 10/10/2014   Colon polyp 10/10/2014   Episodic paroxysmal anxiety disorder 10/10/2014    Allergies  Allergen Reactions   Aspirin    Atorvastatin    Macrobid [Nitrofurantoin]    Nsaids Other (See Comments)    History of ITP, 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, 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    Sulfa Antibiotics Other (See Comments)    Platelets drop     Past Surgical History:  Procedure Laterality Date   BREAST BIOPSY Left 12/28/2016   stereo path pend   BREAST LUMPECTOMY     COLONOSCOPY WITH PROPOFOL N/A 09/08/2017   Procedure: COLONOSCOPY WITH PROPOFOL;  Surgeon: Midge Minium, MD;  Location: [facility];  Service: Endoscopy;  Laterality: N/A;   COLONOSCOPY WITH PROPOFOL N/A 09/03/2020   Procedure: COLONOSCOPY WITH PROPOFOL;  Surgeon: Midge Minium, MD;  Location: [facility];  Service: Endoscopy;  Laterality: N/A;  priority 4   EYE SURGERY     POLYPECTOMY  09/08/2017   Procedure: POLYPECTOMY;  Surgeon: Midge Minium, MD;  Location: [facility];  Service: Endoscopy;;   SPLENECTOMY, TOTAL      Social History   Tobacco Use   Smoking status: Never   Smokeless tobacco: Never  Vaping Use   Vaping status: Never Used  Substance Use Topics   Alcohol use: Not Currently   Drug use: Never     Medication list has been reviewed and updated.  Current Meds  Medication Sig   Cholecalciferol (VITAMIN D3) 25 MCG (1000 UT) CAPS Take 1 capsule by mouth daily.   DULoxetine (CYMBALTA) 60 MG capsule Take 60 mg by mouth daily.   gabapentin (NEURONTIN) 300 MG capsule Take 1 capsule (300 mg total) by mouth at  bedtime. (Patient taking differently: Take 300 mg by mouth in the morning and at bedtime. [facility] doctor- morning and night)   Multiple Vitamins-Minerals (OCUVITE ADULT 50+ PO) Take 1 tablet by mouth in the morning and at bedtime.   vitamin E 400 UNIT capsule Take 400 Units by mouth daily.   [DISCONTINUED] oxybutynin (DITROPAN-XL) 5 MG 24 hr tablet TAKE (1) TABLET BY MOUTH DAILY AT BEDTIME   [DISCONTINUED] simvastatin (ZOCOR) 20 MG tablet Take 1 tablet (20 mg total) by mouth at bedtime.       04/06/2023    3:18 PM 12/12/2022    8:29 AM 10/17/2022    9:41 AM 06/13/2022    9:09 AM  GAD 7 : Generalized Anxiety Score  Nervous, Anxious, on Edge 2 0 0 0  Control/stop worrying 1 0 0 0  Worry too much - different things 2 0 0 0  Trouble relaxing 0 0 0 0  Restless 0 0 0 0  Easily annoyed or irritable 0 0 0 0  Afraid - awful might happen 0 0 0 0  Total GAD 7 Score 5 0 0 0  Anxiety Difficulty Not difficult at all Not difficult at all Not difficult at all Not difficult at all       04/06/2023    3:18 PM 12/12/2022    8:29 AM 10/17/2022    9:40 AM  Depression screen PHQ 2/9  Decreased Interest 3 0 0  Down, Depressed, Hopeless 2 0 0  PHQ - 2 Score 5 0 0  Altered sleeping 0 0 0  Tired, decreased energy 1 0 0  Change in appetite 0 0 0  Feeling bad or failure about yourself  0 0 0  Trouble concentrating 0 0 0  Moving slowly or fidgety/restless 0 0 0  Suicidal thoughts 0 0 0  PHQ-9 Score 6 0 0  Difficult doing work/chores Not difficult at all Not difficult at all Not difficult at all    BP Readings from Last 3 Encounters:  06/19/23 112/74  05/02/23 116/68  05/01/23 118/80    Physical Exam Vitals and nursing note reviewed.  Constitutional:      General: She is not in acute distress.    Appearance: She is not diaphoretic.  HENT:     Head: Normocephalic and atraumatic.     Right Ear: Tympanic membrane, ear canal and external ear normal.     Left Ear: Tympanic membrane, ear canal and  external ear normal.     Nose: Nose normal.  Eyes:     General:        Right eye: No discharge.        Left eye: No discharge.     Conjunctiva/sclera: Conjunctivae normal.     Pupils: Pupils are equal, round, and reactive to light.  Neck:     Thyroid: No thyromegaly.     Vascular: No JVD.  Cardiovascular:     Rate and Rhythm: Normal rate and regular rhythm.     Heart sounds: Normal heart sounds. No murmur heard.    No friction rub. No gallop.  Pulmonary:     Effort: Pulmonary effort is normal.     Breath sounds: Normal breath sounds. No wheezing, rhonchi or rales.  Abdominal:     General: Bowel sounds are normal.     Palpations: Abdomen is soft. There is no hepatomegaly, splenomegaly or mass.     Tenderness: There is no abdominal tenderness.  Musculoskeletal:        General: Normal range of motion.     Cervical back: Normal range of motion and neck supple.  Lymphadenopathy:     Cervical: No cervical adenopathy.  Skin:    General: Skin is warm and dry.  Neurological:     Mental Status: She is alert.     Deep Tendon Reflexes: Reflexes are normal and symmetric.     Wt Readings from Last 3 Encounters:  06/19/23 154 lb 3.2 oz (69.9 kg)  05/02/23 158 lb (71.7 kg)  05/01/23 158 lb (71.7 kg)    BP 112/74   Pulse (!) 109   Ht 5\' 5"  (1.651 m)   Wt 154 lb 3.2 oz (69.9 kg)   SpO2 98%   BMI 25.66 kg/m   Assessment and Plan:  1. Acute non-recurrent maxillary sinusitis (Primary) Acute.  Persistent.  Stable.  The patient is not acutely short of breath and we will proceed with evaluation.  Patient has facial pain consistent with sinus infection.  Will treat with amoxicillin 500 mg 3 times a day. - amoxicillin (AMOXIL) 500 MG capsule;  Take 1 capsule (500 mg total) by mouth 3 (three) times daily for 10 days.  Dispense: 30 capsule; Refill: 0  2. 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  Controlled.  Will refill for 30 days and have her come back for medication refill. - oxybutynin  (DITROPAN-XL) 5 MG 24 hr tablet; TAKE (1) TABLET BY MOUTH DAILY AT BEDTIME  Dispense: 30 tablet; Refill: 0  3. Familial multiple lipoprotein-type hyperlipidemia Chronic.  Controlled.  Will refill for 30 days and have her come back for medication refill visit.  At which time we will do labs. - simvastatin (ZOCOR) 20 MG tablet; Take 1 tablet (20 mg total) by mouth at bedtime.  Dispense: 30 tablet; Refill: 0    Elizabeth Sauer, MD

## 2023-06-26 ENCOUNTER — Ambulatory Visit
Admission: RE | Admit: 2023-06-26 | Discharge: 2023-06-26 | Disposition: A | Discharge status: Home / Self Care | Payer: Medicare PPO | Source: Ambulatory Visit | Attending: Family Medicine | Admitting: Family Medicine

## 2023-06-26 ENCOUNTER — Ambulatory Visit
Admission: RE | Admit: 2023-06-26 | Discharge: 2023-06-26 | Disposition: A | Discharge status: Home / Self Care | Payer: Medicare PPO | Attending: Family Medicine | Admitting: Family Medicine

## 2023-06-26 ENCOUNTER — Encounter: Payer: Self-pay | Admitting: Family Medicine

## 2023-06-26 ENCOUNTER — Ambulatory Visit: Payer: Medicare PPO | Admitting: Family Medicine

## 2023-06-26 VITALS — BP 110/72 | HR 97 | Ht 65.0 in | Wt 154.6 lb

## 2023-06-26 DIAGNOSIS — J321 Chronic frontal sinusitis: Secondary | ICD-10-CM

## 2023-06-26 MED ORDER — AZITHROMYCIN 250 MG PO TABS: ORAL_TABLET | ORAL | 0 refills | Status: AC

## 2023-06-26 MED ORDER — SALINE SPRAY 0.65 % NA SOLN: 1.0000 | NASAL | 1 refills | Status: DC | PRN

## 2023-06-26 MED ORDER — TRIAMCINOLONE ACETONIDE 55 MCG/ACT NA AERO: 2.0000 | INHALATION_SPRAY | Freq: Every day | NASAL | 12 refills | Status: DC

## 2023-06-26 NOTE — Progress Notes (Signed)
Date:  06/26/2023   Name:  Melissa Daniels   DOB:  12-06-1945   MRN:  [id number]   Chief Complaint: Cough (Patient here for continuous cough for the last week. She has 2 more days of her ABX. She is also having dizzy spells and sweats. )  Sinus Problem This is a recurrent problem. The current episode started 1 to 4 weeks ago (Approximately 4 weeks). Associated symptoms include chills, congestion, coughing, diaphoresis, ear pain and sinus pressure. Pertinent negatives include no headaches, hoarse voice, neck pain, shortness of breath, sneezing, sore throat or swollen glands. Past treatments include antibiotics.    Lab Results  Component Value Date   NA 138 06/13/2022   K 4.2 06/13/2022   CO2 31 06/13/2022   GLUCOSE 99 06/13/2022   BUN 17 06/13/2022   CREATININE 0.71 06/13/2022   CALCIUM 9.3 06/13/2022   EGFR 69 12/08/2021   GFRNONAA >60 06/13/2022   Lab Results  Component Value Date   CHOL 220 (H) 12/12/2022   HDL 54 12/12/2022   LDLCALC 127 (H) 12/12/2022   TRIG 219 (H) 12/12/2022   CHOLHDL 4.1 06/13/2022   Lab Results  Component Value Date   TSH 2.850 11/08/2022   No results found for: "HGBA1C" Lab Results  Component Value Date   WBC 5.9 12/12/2022   HGB 13.1 12/12/2022   HCT 38.5 12/12/2022   MCV 91 12/12/2022   PLT 274 12/12/2022   Lab Results  Component Value Date   ALT 21 06/13/2022   AST 27 06/13/2022   ALKPHOS 84 06/13/2022   BILITOT 0.8 06/13/2022   No results found for: "25OHVITD2", "25OHVITD3", "VD25OH"   Review of Systems  Constitutional:  Positive for chills and diaphoresis.  HENT:  Positive for congestion, ear pain and sinus pressure. Negative for hoarse voice, sneezing and sore throat.   Respiratory:  Positive for cough. Negative for shortness of breath.   Musculoskeletal:  Negative for neck pain.  Neurological:  Negative for headaches.    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   Esotropia 03/26/2018   Abscess of finger of right hand    Abscess of left forearm    Cellulitis 10/21/2017   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   History of colonic polyps    Benign neoplasm of cecum    Benign neoplasm of transverse colon    Benign neoplasm of ascending colon    Malignant neoplasm of upper-outer quadrant of left breast in female, estrogen receptor positive (HCC) 01/10/2017   Neuropathy 10/31/2016   Taking medication for chronic disease 10/31/2016   Primary osteoarthritis of right hip 10/31/2016   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HCC) 10/19/2015   Menopause 10/19/2015   Hormone replacement therapy (HRT) 10/19/2015   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   Routine general medical examination at a health care facility 10/10/2014   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   Female stress incontinence 10/10/2014   Colon polyp 10/10/2014   Episodic paroxysmal anxiety disorder 10/10/2014    Allergies  Allergen Reactions   Aspirin    Atorvastatin    Macrobid [Nitrofurantoin]    Nsaids Other (See Comments)  History of ITP, 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, 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    Sulfa Antibiotics Other (See Comments)    Platelets drop     Past Surgical History:  Procedure Laterality Date   BREAST BIOPSY Left 12/28/2016   stereo path pend   BREAST LUMPECTOMY     COLONOSCOPY WITH PROPOFOL N/A 09/08/2017   Procedure: COLONOSCOPY WITH PROPOFOL;  Surgeon: Midge Minium, MD;  Location: [facility];  Service: Endoscopy;  Laterality: N/A;   COLONOSCOPY WITH PROPOFOL N/A  09/03/2020   Procedure: COLONOSCOPY WITH PROPOFOL;  Surgeon: Midge Minium, MD;  Location: [facility];  Service: Endoscopy;  Laterality: N/A;  priority 4   EYE SURGERY     POLYPECTOMY  09/08/2017   Procedure: POLYPECTOMY;  Surgeon: Midge Minium, MD;  Location: [facility];  Service: Endoscopy;;   SPLENECTOMY, TOTAL      Social History   Tobacco Use   Smoking status: Never   Smokeless tobacco: Never  Vaping Use   Vaping status: Never Used  Substance Use Topics   Alcohol use: Not Currently   Drug use: Never     Medication list has been reviewed and updated.  Current Meds  Medication Sig   amoxicillin (AMOXIL) 500 MG capsule Take 1 capsule (500 mg total) by mouth 3 (three) times daily for 10 days.   Cholecalciferol (VITAMIN D3) 25 MCG (1000 UT) CAPS Take 1 capsule by mouth daily.   DULoxetine (CYMBALTA) 60 MG capsule Take 60 mg by mouth daily.   gabapentin (NEURONTIN) 300 MG capsule Take 1 capsule (300 mg total) by mouth at bedtime. (Patient taking differently: Take 300 mg by mouth in the morning and at bedtime. [facility] doctor- morning and night)   Multiple Vitamins-Minerals (OCUVITE ADULT 50+ PO) Take 1 tablet by mouth in the morning and at bedtime.   oxybutynin (DITROPAN-XL) 5 MG 24 hr tablet TAKE (1) TABLET BY MOUTH DAILY AT BEDTIME   simvastatin (ZOCOR) 20 MG tablet Take 1 tablet (20 mg total) by mouth at bedtime.   vitamin E 400 UNIT capsule Take 400 Units by mouth daily.       04/06/2023    3:18 PM 12/12/2022    8:29 AM 10/17/2022    9:41 AM 06/13/2022    9:09 AM  GAD 7 : Generalized Anxiety Score  Nervous, Anxious, on Edge 2 0 0 0  Control/stop worrying 1 0 0 0  Worry too much - different things 2 0 0 0  Trouble relaxing 0 0 0 0  Restless 0 0 0 0  Easily annoyed or irritable 0 0 0 0  Afraid - awful might happen 0 0 0 0  Total GAD 7 Score 5 0 0 0  Anxiety Difficulty Not difficult at all Not difficult at all Not difficult at all Not difficult at all        04/06/2023    3:18 PM 12/12/2022    8:29 AM 10/17/2022    9:40 AM  Depression screen PHQ 2/9  Decreased Interest 3 0 0  Down, Depressed, Hopeless 2 0 0  PHQ - 2 Score 5 0 0  Altered sleeping 0 0 0  Tired, decreased energy 1 0 0  Change in appetite 0 0 0  Feeling bad or failure about yourself  0 0 0  Trouble concentrating 0 0 0  Moving slowly or fidgety/restless 0 0 0  Suicidal thoughts 0 0 0  PHQ-9 Score 6 0 0  Difficult doing work/chores Not difficult at all Not difficult at all Not difficult at all    BP Readings from Last 3 Encounters:  06/26/23 110/72  06/19/23 112/74  05/02/23 116/68    Physical Exam Vitals and nursing note reviewed.  Constitutional:      General: She is not in acute distress.    Appearance: She is not diaphoretic.  HENT:     Head: Normocephalic and atraumatic.     Right Ear: Tympanic membrane, ear canal and external ear normal.     Left Ear: Tympanic membrane, ear canal and external ear normal.     Nose: Nose normal.  Eyes:     General:        Right eye: No discharge.        Left eye: No discharge.     Conjunctiva/sclera: Conjunctivae normal.     Pupils: Pupils are equal, round, and reactive to light.  Neck:     Thyroid: No thyromegaly.     Vascular: No JVD.  Cardiovascular:     Rate and Rhythm: Normal rate and regular rhythm.     Heart sounds: Normal heart sounds. No murmur heard.    No friction rub. No gallop.  Pulmonary:     Effort: Pulmonary effort is normal.     Breath sounds: Normal breath sounds. No wheezing, rhonchi or rales.  Abdominal:     General: Bowel sounds are normal.     Palpations: Abdomen is soft. There is no mass.     Tenderness: There is no abdominal tenderness. There is no guarding.  Musculoskeletal:        General: Normal range of motion.     Cervical back: Normal range of motion and neck supple.  Lymphadenopathy:     Cervical: No cervical adenopathy.  Skin:    General: Skin is warm and dry.   Neurological:     Mental Status: She is alert.     Deep Tendon Reflexes: Reflexes are normal and symmetric.     Wt Readings from Last 3 Encounters:  06/26/23 154 lb 9.6 oz (70.1 kg)  06/19/23 154 lb 3.2 oz (69.9 kg)  05/02/23 158 lb (71.7 kg)    BP 110/72   Pulse 97   Ht 5\' 5"  (1.651 m)   Wt 154 lb 9.6 oz (70.1 kg)   SpO2 96%   BMI 25.73 kg/m   Assessment and Plan:  1. Chronic frontal sinusitis (Primary) Chronic.  Persistent.  Relatively stable and that she is not febrile or having further worsening may be a little better but not resolving and patient is questioning whether or not we need to change antibiotic.  In fact she has not been better I explained to her that this antibiotic has helped in the past and that is not a matter of jumping to another antibiotic although we did start azithromycin and asked her to stop her current amoxicillin but with trepidation that this is probably not the reason.  On questioning patient is using a wood stove and I have explained the particulate matter is probably contributing to the lack of clearance of her symptomatology.  In the meantime patient continues to have sinus pressure in the maxillary and frontal area there is some tenderness that is noted but no postnasal drainage.  Chest percussion and auscultation is unremarkable therefore we are going to maximize treatment of the sinuses with saline lavage spray, patient is already jittery and I am reluctant to put her on Sudafed but we  did and Nasacort a nonalcohol based corticosteroid nasal spray to open up passages.  Reluctantly we have switched to azithromycin to see if this may help the circumstance and I have asked her to switch back to either gas or oil heating and that the woodstove probably is emitting some particulate concerns which may be causing a chronic irritation.  Patient been encouraged to return if there is no resolution of symptomatology. - DG Sinuses Complete - sodium chloride (OCEAN)  0.65 % SOLN nasal spray; Place 1 spray into both nostrils as needed for congestion.  Dispense: 30 mL; Refill: 1 - triamcinolone (NASACORT) 55 MCG/ACT AERO nasal inhaler; Place 2 sprays into the nose daily.  Dispense: 1 each; Refill: 12 - azithromycin (ZITHROMAX) 250 MG tablet; Take 2 tablets on day 1, then 1 tablet daily on days 2 through 5  Dispense: 6 tablet; Refill: 0    Elizabeth Sauer, MD

## 2023-06-27 ENCOUNTER — Telehealth: Payer: Self-pay

## 2023-06-27 NOTE — Telephone Encounter (Signed)
Sent patient mychart message to clarify

## 2023-06-27 NOTE — Telephone Encounter (Signed)
Pt called for xray results.  Shared providers note with pt.  Pt is asking for clarification regarding ABX. Should pt continue azithromycin that was prescribed yesterday?   Pt would like a call back.   Duanne Limerick, MD 06/27/2023  6:32 AM EST     All sinuses are clear.  We do not need to continue using additional antibiotics I would suggest that we use the nasal saline as well as the nasal steroid and perhaps a short course of Sudafed 30 mg as prescribed we will need to ask the pharmacist for this.  It was pleasure to see you on Monday

## 2023-07-16 ENCOUNTER — Telehealth: Payer: Self-pay | Admitting: Emergency Medicine

## 2023-07-16 ENCOUNTER — Ambulatory Visit
Admission: EM | Admit: 2023-07-16 | Discharge: 2023-07-16 | Disposition: A | Discharge status: Home / Self Care | Payer: Medicare PPO | Attending: Physician Assistant | Admitting: Physician Assistant

## 2023-07-16 DIAGNOSIS — L03113 Cellulitis of right upper limb: Secondary | ICD-10-CM

## 2023-07-16 DIAGNOSIS — Z203 Contact with and (suspected) exposure to rabies: Secondary | ICD-10-CM

## 2023-07-16 DIAGNOSIS — W5501XA Bitten by cat, initial encounter: Secondary | ICD-10-CM

## 2023-07-16 MED ORDER — CEFTRIAXONE SODIUM 1 G IJ SOLR
1.0000 g | INTRAMUSCULAR | Status: DC
Start: 2023-07-16 — End: 2023-07-16
  Administered 2023-07-16: 1 g via INTRAMUSCULAR

## 2023-07-16 MED ORDER — AMOXICILLIN-POT CLAVULANATE 875-125 MG PO TABS: 1.0000 | ORAL_TABLET | Freq: Two times a day (BID) | ORAL | 0 refills | Status: AC

## 2023-07-16 MED ORDER — TETANUS-DIPHTH-ACELL PERTUSSIS 5-2.5-18.5 LF-MCG/0.5 IM SUSY
0.5000 mL | PREFILLED_SYRINGE | Freq: Once | INTRAMUSCULAR | Status: AC
Start: 2023-07-16 — End: 2023-07-16
  Administered 2023-07-16: 0.5 mL via INTRAMUSCULAR

## 2023-07-16 MED ORDER — AMOXICILLIN-POT CLAVULANATE 875-125 MG PO TABS: 1.0000 | ORAL_TABLET | Freq: Two times a day (BID) | ORAL | 0 refills | Status: DC

## 2023-07-16 NOTE — ED Triage Notes (Addendum)
Pt is with her son  Pt c/o cat bite to left palm on 07/15/23  Pt states taht she is not worried about rabies   Pt was playing with her cat when the cat bit her hand.   Pt states that her fingers hurt when they move and she feels "something" going up her arm from the cat bite to the back of the left hand

## 2023-07-16 NOTE — Telephone Encounter (Signed)
Patient needs medication sent to [facility] in [place] instead of [facility].

## 2023-07-16 NOTE — Discharge Instructions (Addendum)
-  You have an infection of your hand and wrist.  You were given an injection of antibiotic in the clinic. - Start Augmentin before bed.  Take this twice daily for the next 7 to 10 days. - Elevate and ice extremity. - Return in 24 hours for reevaluation. - If symptoms worsen before then please go to the ER.  If you have not improved in 24 hours or symptoms of worsened when we see you you will likely be referred to the emergency department.

## 2023-07-16 NOTE — ED Provider Notes (Signed)
[facility]    CSN: [id number] Arrival date & time: 07/16/23  1156      History   Chief Complaint Chief Complaint  Patient presents with   Animal Bite    HPI Melissa Daniels is a 78 y.o. female presenting with her son for evaluation of cat bite or scratch to left hand that occurred last night around 8 PM.  She reports increased pain, redness and swelling today.  Noticed it when she was in church.  She says her cat is about 78 years old.  Is vaccinated.  Patient reports her last vaccination for tetanus was around 2017.  She is not concerned about rabies at this time.  She did not have any fevers.  She has an appointment with her PCP in the morning at 9 AM.  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   History of colonic polyps    Benign neoplasm of cecum    Benign neoplasm of transverse colon    Benign neoplasm of ascending colon    Malignant neoplasm of upper-outer quadrant of left breast in female, estrogen receptor positive (HCC) 01/10/2017   Neuropathy 10/31/2016   Taking  medication for chronic disease 10/31/2016   Primary osteoarthritis of right hip 10/31/2016   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HCC) 10/19/2015   Menopause 10/19/2015   Hormone replacement therapy (HRT) 10/19/2015   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   Routine general medical examination at a health care facility 10/10/2014   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   Female stress incontinence 10/10/2014   Colon polyp 10/10/2014   Episodic paroxysmal anxiety disorder 10/10/2014    Past Surgical History:  Procedure Laterality Date   BREAST BIOPSY Left 12/28/2016   stereo path pend   BREAST LUMPECTOMY     COLONOSCOPY WITH PROPOFOL N/A 09/08/2017   Procedure: COLONOSCOPY WITH PROPOFOL;  Surgeon: Midge Minium, MD;  Location: [facility];  Service: Endoscopy;  Laterality: N/A;   COLONOSCOPY WITH PROPOFOL N/A 09/03/2020   Procedure: COLONOSCOPY WITH PROPOFOL;  Surgeon: Midge Minium, MD;  Location: [facility];  Service: Endoscopy;  Laterality: N/A;  priority 4   EYE SURGERY     POLYPECTOMY  09/08/2017   Procedure: POLYPECTOMY;  Surgeon: Midge Minium, MD;  Location: [facility];  Service: Endoscopy;;   SPLENECTOMY, TOTAL      OB History     Gravida  3   Para  3   Term  3   Preterm      AB  Living  3      SAB      IAB      Ectopic      Multiple      Live Births               Home Medications    Prior to Admission medications   Medication Sig Start Date End Date Taking? Authorizing Provider  amoxicillin-clavulanate (AUGMENTIN) 875-125 MG tablet Take 1 tablet by mouth every 12 (twelve) hours for 10 days. 07/16/23 07/26/23 Yes Shirlee Latch, PA-C  Cholecalciferol (VITAMIN D3) 25 MCG (1000 UT) CAPS Take 1 capsule by mouth daily.   Yes [provider]  DULoxetine (CYMBALTA) 60 MG capsule Take 60 mg by mouth daily. 06/25/21  Yes [provider]  gabapentin (NEURONTIN) 300 MG capsule Take 1 capsule  (300 mg total) by mouth at bedtime. Patient taking differently: Take 300 mg by mouth in the morning and at bedtime. [facility] doctor- morning and night 06/28/19  Yes Duanne Limerick, MD  Multiple Vitamins-Minerals (OCUVITE ADULT 50+ PO) Take 1 tablet by mouth in the morning and at bedtime.   Yes [provider]  oxybutynin (DITROPAN-XL) 5 MG 24 hr tablet TAKE (1) TABLET BY MOUTH DAILY AT BEDTIME 06/19/23  Yes Duanne Limerick, MD  simvastatin (ZOCOR) 20 MG tablet Take 1 tablet (20 mg total) by mouth at bedtime. 06/19/23  Yes Duanne Limerick, MD  vitamin E 400 UNIT capsule Take 400 Units by mouth daily.   Yes [provider]  ALPRAZolam (XANAX) 0.25 MG tablet Take 1 tablet (0.25 mg total) by mouth 2 (two) times daily as needed for anxiety. Patient not taking: Reported on 06/19/2023 04/06/23   Duanne Limerick, MD  Clotrimazole 1 % OINT Apply fingertip amount to affected areas in the morning and at bedtime 04/05/23   Julieanne Manson, MD  sodium chloride (OCEAN) 0.65 % SOLN nasal spray Place 1 spray into both nostrils as needed for congestion. 06/26/23   Duanne Limerick, MD  triamcinolone (KENALOG) 0.025 % ointment Apply fingertip amount to affected areas in the morning and at bedtime Patient not taking: Reported on 06/19/2023 04/05/23   Julieanne Manson, MD  triamcinolone (NASACORT) 55 MCG/ACT AERO nasal inhaler Place 2 sprays into the nose daily. 06/26/23   Duanne Limerick, MD    Family History Family History  Problem Relation Age of Onset   Heart disease Mother    Colon cancer Mother    Hypertension Mother    Heart disease Father    Hypertension Father    Breast cancer Neg Hx     Social History Social History   Tobacco Use   Smoking status: Never   Smokeless tobacco: Never  Vaping Use   Vaping status: Never Used  Substance Use Topics   Alcohol use: Not Currently   Drug use: Never     Allergies   Aspirin, Atorvastatin, Macrobid [nitrofurantoin], Nsaids, and Sulfa  antibiotics   Review of Systems Review of Systems  Constitutional:  Negative for fatigue and fever.  Musculoskeletal:  Positive for arthralgias and joint swelling.  Skin:  Positive for color change and wound.  Neurological:  Negative for weakness.     Physical Exam Triage Vital Signs ED Triage Vitals  Encounter Vitals Group     BP 07/16/23 1400 (!) 134/55     Systolic BP Percentile --      Diastolic BP Percentile --      Pulse Rate 07/16/23 1400 100  Resp --      Temp 07/16/23 1400 98.3 F (36.8 C)     Temp Source 07/16/23 1400 Oral     SpO2 07/16/23 1400 100 %     Weight 07/16/23 1401 160 lb (72.6 kg)     Height 07/16/23 1401 5\' 6"  (1.676 m)     Head Circumference --      Peak Flow --      Pain Score 07/16/23 1401 9     Pain Loc --      Pain Education --      Exclude from Growth Chart --    No data found.  Updated Vital Signs BP (!) 134/55 (BP Location: Left Arm)   Pulse 100   Temp 98.3 F (36.8 C) (Oral)   Ht 5\' 6"  (1.676 m)   Wt 160 lb (72.6 kg)   SpO2 100%   BMI 25.82 kg/m      Physical Exam Vitals and nursing note reviewed.  Constitutional:      General: She is not in acute distress.    Appearance: Normal appearance. She is not ill-appearing or toxic-appearing.  HENT:     Head: Normocephalic and atraumatic.  Eyes:     General: No scleral icterus.       Right eye: No discharge.        Left eye: No discharge.     Conjunctiva/sclera: Conjunctivae normal.  Cardiovascular:     Rate and Rhythm: Normal rate and regular rhythm.     Pulses: Normal pulses.  Pulmonary:     Effort: Pulmonary effort is normal. No respiratory distress.  Musculoskeletal:     Cervical back: Neck supple.  Skin:    General: Skin is dry.     Findings: Erythema present.     Comments: Left hand/wrist: See image included in chart.  Patient has moderate erythema, swelling and tenderness of the dorsal hand, wrist and proximal forearm.  Superficial abrasions noted on dorsal hand.   Good pulses.  Full range of motion.  Neurological:     General: No focal deficit present.     Mental Status: She is alert. Mental status is at baseline.     Motor: No weakness.     Gait: Gait normal.  Psychiatric:        Mood and Affect: Mood normal.        Behavior: Behavior normal.      UC Treatments / Results  Labs (all labs ordered are listed, but only abnormal results are displayed) Labs Reviewed - No data to display  EKG   Radiology No results found.  Procedures Procedures (including critical care time)  Medications Ordered in UC Medications  cefTRIAXone (ROCEPHIN) injection 1 g (has no administration in time range)  Tdap (BOOSTRIX) injection 0.5 mL (has no administration in time range)    Initial Impression / Assessment and Plan / UC Course  I have reviewed the triage vital signs and the nursing notes.  Pertinent labs & imaging results that were available during my care of the patient were reviewed by me and considered in my medical decision making (see chart for details).   78 year old female presents for evaluation of cat bite or scratch to the left hand that occurred last night.  Developed redness, swelling and pain today.  No associated fevers.  She is afebrile.  Overall well-appearing.  See image included in chart.  Patient has superficial abrasions and moderate erythema, swelling and tenderness of the left dorsal hand and wrist.  Cellulitis of left hand/wrist.  Patient given 1 g IM Rocephin in clinic.  Tdap updated. Sent Augmentin to pharmacy.  Advised elevation, ice, rest.  Advised to follow-up with PCP as scheduled in the morning and have her look at this area again.  I do a line around the area of redness.  Explained that if it spreads outside this she may need to go to the emergency department as that would likely indicate a failure to oral/outpatient antibiotics.  Patient is understanding and agreeable.  Advised if any symptoms acutely worsen before then to  go straight to the ER.   Final Clinical Impressions(s) / UC Diagnoses   Final diagnoses:  Cellulitis of right arm  Cat bite, initial encounter     Discharge Instructions      -You have an infection of your hand and wrist.  You were given an injection of antibiotic in the clinic. - Start Augmentin before bed.  Take this twice daily for the next 7 to 10 days. - Elevate and ice extremity. - Return in 24 hours for reevaluation. - If symptoms worsen before then please go to the ER.  If you have not improved in 24 hours or symptoms of worsened when we see you you will likely be referred to the emergency department.     ED Prescriptions     Medication Sig Dispense Auth. Provider   amoxicillin-clavulanate (AUGMENTIN) 875-125 MG tablet Take 1 tablet by mouth every 12 (twelve) hours for 10 days. 20 tablet Gareth Morgan      PDMP not reviewed this encounter.   Shirlee Latch, PA-C 07/16/23 1451

## 2023-07-17 ENCOUNTER — Encounter: Payer: Self-pay | Admitting: Family Medicine

## 2023-07-17 ENCOUNTER — Ambulatory Visit: Payer: Medicare PPO | Admitting: Family Medicine

## 2023-07-17 VITALS — BP 118/68 | HR 68 | Temp 98.9°F | Resp 16 | Ht 66.0 in | Wt 151.0 lb

## 2023-07-17 DIAGNOSIS — L03113 Cellulitis of right upper limb: Secondary | ICD-10-CM | POA: Diagnosis not present

## 2023-07-17 DIAGNOSIS — W5501XD Bitten by cat, subsequent encounter: Secondary | ICD-10-CM | POA: Diagnosis not present

## 2023-07-17 NOTE — Progress Notes (Addendum)
Date:  07/17/2023   Name:  Melissa Daniels   DOB:  05/25/1946   MRN:  [id number]   Chief Complaint: Follow-up (Follow up UC- Cat scratch Saturday. Started Abx Aug 875 BID. )  Arm Pain  The incident occurred 2 days ago. The incident occurred at home. Injury mechanism: cat bite. The pain is present in the left forearm. The quality of the pain is described as aching (pain with hand motion). The pain radiates to the left hand. The pain is at a severity of 9/10. The pain is severe. The pain has been Worsening since the incident. Pertinent negatives include no numbness or tingling. The symptoms are aggravated by movement. She has tried NSAIDs (augmentin) for the symptoms. The treatment provided no relief.    Lab Results  Component Value Date   NA 138 06/13/2022   K 4.2 06/13/2022   CO2 31 06/13/2022   GLUCOSE 99 06/13/2022   BUN 17 06/13/2022   CREATININE 0.71 06/13/2022   CALCIUM 9.3 06/13/2022   EGFR 69 12/08/2021   GFRNONAA >60 06/13/2022   Lab Results  Component Value Date   CHOL 220 (H) 12/12/2022   HDL 54 12/12/2022   LDLCALC 127 (H) 12/12/2022   TRIG 219 (H) 12/12/2022   CHOLHDL 4.1 06/13/2022   Lab Results  Component Value Date   TSH 2.850 11/08/2022   No results found for: "HGBA1C" Lab Results  Component Value Date   WBC 5.9 12/12/2022   HGB 13.1 12/12/2022   HCT 38.5 12/12/2022   MCV 91 12/12/2022   PLT 274 12/12/2022   Lab Results  Component Value Date   ALT 21 06/13/2022   AST 27 06/13/2022   ALKPHOS 84 06/13/2022   BILITOT 0.8 06/13/2022   No results found for: "25OHVITD2", "25OHVITD3", "VD25OH"   Review of Systems  Neurological:  Negative for tingling and numbness.    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   Dry eye syndrome of both eyes 08/06/2020   Binocular vision disorder with diplopia 08/06/2020   Sixth (abducent) nerve palsy, 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   Esotropia 03/26/2018   Abscess of finger of right hand    Abscess of left forearm    Cellulitis 10/21/2017   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   History of colonic polyps    Benign neoplasm of cecum    Benign neoplasm of transverse colon    Benign neoplasm of ascending colon    Malignant neoplasm of upper-outer quadrant of left breast in female, estrogen receptor positive (HCC) 01/10/2017   Neuropathy 10/31/2016   Taking medication for chronic disease 10/31/2016   Primary osteoarthritis of right hip 10/31/2016   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HCC) 10/19/2015   Menopause 10/19/2015   Hormone replacement therapy (HRT) 10/19/2015   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   Routine general medical examination at a health care facility 10/10/2014   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   Female stress incontinence 10/10/2014   Colon polyp 10/10/2014   Episodic paroxysmal anxiety disorder 10/10/2014    Allergies  Allergen Reactions   Aspirin    Atorvastatin    Macrobid [Nitrofurantoin]    Nsaids Other (See Comments)    History of ITP, 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, 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  Sulfa Antibiotics Other (See Comments)    Platelets drop     Past Surgical History:  Procedure Laterality Date   BREAST BIOPSY Left 12/28/2016   stereo path pend   BREAST LUMPECTOMY     COLONOSCOPY WITH PROPOFOL N/A 09/08/2017   Procedure: COLONOSCOPY WITH PROPOFOL;  Surgeon: Midge Minium, MD;  Location: [facility];  Service: Endoscopy;  Laterality: N/A;   COLONOSCOPY WITH PROPOFOL N/A 09/03/2020   Procedure: COLONOSCOPY WITH PROPOFOL;  Surgeon: Midge Minium, MD;  Location: [facility];  Service: Endoscopy;  Laterality: N/A;  priority 4   EYE SURGERY      POLYPECTOMY  09/08/2017   Procedure: POLYPECTOMY;  Surgeon: Midge Minium, MD;  Location: [facility];  Service: Endoscopy;;   SPLENECTOMY, TOTAL      Social History   Tobacco Use   Smoking status: Never   Smokeless tobacco: Never  Vaping Use   Vaping status: Never Used  Substance Use Topics   Alcohol use: Not Currently   Drug use: Never     Medication list has been reviewed and updated.  Current Meds  Medication Sig   amoxicillin-clavulanate (AUGMENTIN) 875-125 MG tablet Take 1 tablet by mouth every 12 (twelve) hours for 10 days.   Cholecalciferol (VITAMIN D3) 25 MCG (1000 UT) CAPS Take 1 capsule by mouth daily.   Clotrimazole 1 % OINT Apply fingertip amount to affected areas in the morning and at bedtime   DULoxetine (CYMBALTA) 60 MG capsule Take 60 mg by mouth daily.   gabapentin (NEURONTIN) 300 MG capsule Take 1 capsule (300 mg total) by mouth at bedtime. (Patient taking differently: Take 300 mg by mouth in the morning and at bedtime. [facility] doctor- morning and night)   Multiple Vitamins-Minerals (OCUVITE ADULT 50+ PO) Take 1 tablet by mouth in the morning and at bedtime.   oxybutynin (DITROPAN-XL) 5 MG 24 hr tablet TAKE (1) TABLET BY MOUTH DAILY AT BEDTIME   simvastatin (ZOCOR) 20 MG tablet Take 1 tablet (20 mg total) by mouth at bedtime.   sodium chloride (OCEAN) 0.65 % SOLN nasal spray Place 1 spray into both nostrils as needed for congestion.   triamcinolone (KENALOG) 0.025 % ointment Apply fingertip amount to affected areas in the morning and at bedtime   triamcinolone (NASACORT) 55 MCG/ACT AERO nasal inhaler Place 2 sprays into the nose daily.   vitamin E 400 UNIT capsule Take 400 Units by mouth daily.       04/06/2023    3:18 PM 12/12/2022    8:29 AM 10/17/2022    9:41 AM 06/13/2022    9:09 AM  GAD 7 : Generalized Anxiety Score  Nervous, Anxious, on Edge 2 0 0 0  Control/stop worrying 1 0 0 0  Worry too much - different things 2 0 0 0  Trouble relaxing 0 0 0  0  Restless 0 0 0 0  Easily annoyed or irritable 0 0 0 0  Afraid - awful might happen 0 0 0 0  Total GAD 7 Score 5 0 0 0  Anxiety Difficulty Not difficult at all Not difficult at all Not difficult at all Not difficult at all       04/06/2023    3:18 PM 12/12/2022    8:29 AM 10/17/2022    9:40 AM  Depression screen PHQ 2/9  Decreased Interest 3 0 0  Down, Depressed, Hopeless 2 0 0  PHQ - 2 Score 5 0 0  Altered sleeping 0 0 0  Tired,  decreased energy 1 0 0  Change in appetite 0 0 0  Feeling bad or failure about yourself  0 0 0  Trouble concentrating 0 0 0  Moving slowly or fidgety/restless 0 0 0  Suicidal thoughts 0 0 0  PHQ-9 Score 6 0 0  Difficult doing work/chores Not difficult at all Not difficult at all Not difficult at all    BP Readings from Last 3 Encounters:  07/17/23 118/68  07/16/23 (!) 134/55  06/26/23 110/72    Physical Exam Vitals and nursing note reviewed.  Constitutional:      General: She is in acute distress.  Musculoskeletal:        General: Swelling, tenderness and signs of injury present.  Skin:    Findings: Erythema present.  Neurological:     Mental Status: She is alert.     Wt Readings from Last 3 Encounters:  07/17/23 151 lb (68.5 kg)  07/16/23 160 lb (72.6 kg)  06/26/23 154 lb 9.6 oz (70.1 kg)    BP 118/68   Pulse 68   Temp 98.9 F (37.2 C) (Oral)   Resp 16   Ht 5\' 6"  (1.676 m)   Wt 151 lb (68.5 kg)   BMI 24.37 kg/m   Assessment and Plan:  1. Cat bite, subsequent encounter (Primary) New onset.  Persistent.  Gradually worsening.  Uncontrolled pain and likely infection.  Patient was playing with her cat and the cat accidentally I suspect bit her because there is 2 puncture marks that are noted likely upper canine although with the possibility of scratches as well.  Exquisite tenderness to any palpation patient is unable to move hand wrist in any direction including extending fingers.  I suspect that there is an underlying abscess  certainly has cellulitis that has not resolved on the Augmentin that was initiated yesterday.  Patient needs further evaluation for suspected abscess with likely incision and drainage if necessary in an OR setting.  She is certainly probably will need to go to an emergency room setting and initiate IV antibiotics.   Elizabeth Sauer, MD

## 2023-07-24 ENCOUNTER — Telehealth: Payer: Self-pay

## 2023-07-24 NOTE — Transitions of Care (Post Inpatient/ED Visit) (Signed)
 07/24/2023  Name: Melissa Daniels MRN: [id number] DOB: [birth year]  Today's TOC FU Call Status: Today's TOC FU Call Status:: Successful TOC FU Call Completed TOC FU Call Complete Date: 07/24/23 Patient's Name and Date of Birth confirmed.  Transition Care Management Follow-up Telephone Call Date of Discharge: 07/22/23 Discharge Facility: Other ([facility]) Name of Other ([facility]) Discharge Facility: [facility] Type of Discharge: Inpatient Admission Primary Inpatient Discharge Diagnosis:: cat bite How have you been since you were released from the hospital?: Better Any questions or concerns?: No  Items Reviewed: Did you receive and understand the discharge instructions provided?: No Medications obtained,verified, and reconciled?: Yes (Medications Reviewed) Any new allergies since your discharge?: No Dietary orders reviewed?: Yes Do you have support at home?: Yes People in Home: child(ren), adult  Medications Reviewed Today: Medications Reviewed Today     Reviewed by Karena Addison, LPN (Licensed Practical Nurse) on 07/24/23 at 1243  Med List Status: <None>   Medication Order Taking? Sig Documenting Provider Last Dose Status Informant  ALPRAZolam (XANAX) 0.25 MG tablet [id number] No Take 1 tablet (0.25 mg total) by mouth 2 (two) times daily as needed for anxiety.  Patient not taking: Reported on 07/17/2023   Duanne Limerick, MD Not Taking Active            Med Note [facility], JESSENIA   Mon Jun 19, 2023  9:07 AM) PRN  amoxicillin-clavulanate (AUGMENTIN) 875-125 MG tablet [id number] No Take 1 tablet by mouth every 12 (twelve) hours for 10 days. Shirlee Latch, PA-C Taking Active   Cholecalciferol (VITAMIN D3) 25 MCG (1000 UT) CAPS [id number] No Take 1 capsule by mouth daily. [provider] Taking Active   Clotrimazole 1 % OINT [id number] No Apply fingertip amount to affected areas in the morning and at bedtime Julieanne Manson, MD Taking Active   DULoxetine (CYMBALTA) 60 MG capsule  [id number] No Take 60 mg by mouth daily. [provider] Taking Active   gabapentin (NEURONTIN) 300 MG capsule [id number] No Take 1 capsule (300 mg total) by mouth at bedtime.  Patient taking differently: Take 300 mg by mouth in the morning and at bedtime. [facility] doctor- morning and night   Duanne Limerick, MD Taking Active   Multiple Vitamins-Minerals (OCUVITE ADULT 50+ PO) [id number] No Take 1 tablet by mouth in the morning and at bedtime. [provider] Taking Active   oxybutynin (DITROPAN-XL) 5 MG 24 hr tablet [id number] No TAKE (1) TABLET BY MOUTH DAILY AT BEDTIME Duanne Limerick, MD Taking Active   simvastatin (ZOCOR) 20 MG tablet [id number] No Take 1 tablet (20 mg total) by mouth at bedtime. Duanne Limerick, MD Taking Active   sodium chloride (OCEAN) 0.65 % SOLN nasal spray [id number] No Place 1 spray into both nostrils as needed for congestion. Duanne Limerick, MD Taking Active   triamcinolone (KENALOG) 0.025 % ointment [id number] No Apply fingertip amount to affected areas in the morning and at bedtime Julieanne Manson, MD Taking Active   triamcinolone (NASACORT) 55 MCG/ACT AERO nasal inhaler [id number] No Place 2 sprays into the nose daily. Duanne Limerick, MD Taking Active   vitamin E 400 UNIT capsule [id number] No Take 400 Units by mouth daily. [provider] Taking Active Self            Home Care and Equipment/Supplies: Were Home Health Services Ordered?: NA Any new equipment or medical supplies ordered?: NA  Functional Questionnaire: Do you need assistance with bathing/showering or dressing?: No Do you need assistance  with meal preparation?: No Do you need assistance with eating?: No Do you have difficulty maintaining continence: No Do you need assistance with getting out of bed/getting out of a chair/moving?: No Do you have difficulty managing or taking your medications?: No  Follow up appointments reviewed: PCP Follow-up appointment confirmed?: No  (declined appt) MD Provider Line [phone] Given: No Specialist Hospital Follow-up appointment confirmed?: NA Do you need transportation to your follow-up appointment?: No Do you understand care options if your condition(s) worsen?: Yes-patient verbalized understanding    SIGNATURE Karena Addison, LPN [facility] Nurse Health Advisor Direct Dial [phone]

## 2023-07-27 ENCOUNTER — Inpatient Hospital Stay: Payer: Medicare PPO | Admitting: Family Medicine

## 2023-08-17 ENCOUNTER — Other Ambulatory Visit: Payer: Self-pay | Admitting: Internal Medicine

## 2023-08-17 DIAGNOSIS — M8580 Other specified disorders of bone density and structure, unspecified site: Secondary | ICD-10-CM

## 2023-08-28 ENCOUNTER — Telehealth: Payer: Self-pay

## 2023-08-28 NOTE — Telephone Encounter (Signed)
 Please call pt to schedule an appt.  KP

## 2023-08-28 NOTE — Telephone Encounter (Signed)
 Please review.  KP  Copied from CRM [id number]. Topic: Clinical - Medical Advice >> Aug 28, 2023  9:39 AM Carlatta H wrote: Reason for CRM: Patient would like to know when she can get her next cortizone shots in hands//Also she would like to know if she could drive with the shots as well//Please call to advise

## 2023-09-04 ENCOUNTER — Ambulatory Visit: Admitting: Family Medicine

## 2023-09-04 ENCOUNTER — Encounter: Payer: Self-pay | Admitting: Family Medicine

## 2023-09-04 ENCOUNTER — Other Ambulatory Visit (INDEPENDENT_AMBULATORY_CARE_PROVIDER_SITE_OTHER): Payer: Self-pay | Admitting: Radiology

## 2023-09-04 VITALS — BP 140/90 | HR 92 | Ht 66.0 in | Wt 152.0 lb

## 2023-09-04 DIAGNOSIS — M19049 Primary osteoarthritis, unspecified hand: Secondary | ICD-10-CM | POA: Diagnosis not present

## 2023-09-04 NOTE — Assessment & Plan Note (Signed)
 History of Present Illness Melissa Daniels is a 78 year old female with bilateral first CMC arthritis who presents with acute on chronic pain at the base of the thumb.  The patient experiences pain at the base of the thumbs, which was previously managed with a cortisone injections to bilateral first [facility] joints on May 05, 2023. This intervention provided significant relief for approximately three months, but the pain has returned in the past few weeks. The pain is bilateral, with greater intensity on the dominant right hand. Her history of arthritis is confirmed by x-rays from 04/06/2023 showing narrowing at the base of the thumb joint which were reviewed with the patient today.  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  Physical Exam PALPATION: Tenderness at bilateral first CMC joints with prominence of the CMC junctions.  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  Assessment and Plan First [facility] bilateral osteoarthritis Chronic arthritis confirmed by x-rays. Previous cortisone injections provided relief for three+ months. Discussed occupational therapy and joint health supplements as adjuncts. Recommended thumb brace for support. - Administer cortisone injections to both thumbs. - Advise relative rest for two days, avoiding repetitive or aggressive hand activities. - Apply ice to the affected area for 20 minutes, repeating in the evening. - Recommend considering occupational therapy for hand strengthening in the future. - Suggest trialing one joint health supplement for a month to assess efficacy. - Provide information on a modabber thumb brace for additional support.

## 2023-09-04 NOTE — Progress Notes (Signed)
 Primary Care / Sports Medicine Office Visit  Patient Information:  Patient ID: Melissa Daniels, female DOB: Jul 06, 1945 Age: 78 y.o. MRN: [id number]   Melissa Daniels is a pleasant 78 y.o. female presenting with the following:  Chief Complaint  Patient presents with   Hand Pain    Bil thumb pain x 1 month. Patient has pain from her in her thumb joints and radiates up her wrist. Aggravating factors are gripping, grasping, and squeezing. She has been using aspera cream, tylenol, and Voltaren gel.    Vitals:   09/04/23 0959  BP: (!) 140/90  Pulse: 92  SpO2: 97%   Vitals:   09/04/23 0959  Weight: 152 lb (68.9 kg)  Height: 5\' 6"  (1.676 m)   Body mass index is 24.53 kg/m.  No results found.   Independent interpretation of notes and tests performed by another provider:   None  Procedures performed:   Procedure:  Injection of right hand under ultrasound guidance. Ultrasound guidance utilized for out of plane approach to first [facility], cortical irregularity consistent with osteoarthritis noted Samsung HS60 device utilized with permanent recording / reporting. Verbal informed consent obtained and verified. Skin prepped in a sterile fashion. Ethyl chloride for topical local analgesia.  Completed without difficulty and tolerated well. Medication: triamcinolone acetonide 40 mg/mL suspension for injection 1 mL total and 0.5 mL lidocaine 1% without epinephrine utilized for needle placement anesthetic Advised to contact for fevers/chills, erythema, induration, drainage, or persistent bleeding.  Procedure:  Injection of left hand under ultrasound guidance. Ultrasound guidance utilized for out of plane approach to first CMC, dynamic joint motion visualized Samsung HS60 device utilized with permanent recording / reporting. Verbal informed consent obtained and verified. Skin prepped in a sterile fashion. Ethyl chloride for topical local analgesia.  Completed without difficulty and tolerated  well. Medication: triamcinolone acetonide 40 mg/mL suspension for injection 1 mL total and 0.5 mL lidocaine 1% without epinephrine utilized for needle placement anesthetic Advised to contact for fevers/chills, erythema, induration, drainage, or persistent bleeding.   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  The patient experiences pain at the base of the thumbs, which was previously managed with a cortisone injections to bilateral first [facility] joints on May 05, 2023. This intervention provided significant relief for approximately three months, but the pain has returned in the past few weeks. The pain is bilateral, with greater intensity on the dominant right hand. Her history of arthritis is confirmed by x-rays from 04/06/2023 showing narrowing at the base of the thumb joint which were reviewed with the patient today.  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  Physical Exam PALPATION: Tenderness at bilateral first CMC joints with prominence of the CMC junctions.  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  Assessment and Plan First [facility] bilateral osteoarthritis Chronic arthritis confirmed by x-rays. Previous cortisone injections provided relief for three+ months. Discussed occupational therapy and joint health supplements as adjuncts. Recommended thumb brace for support. - Administer cortisone injections  to both thumbs. - Advise relative rest for two days, avoiding  repetitive or aggressive hand activities. - Apply ice to the affected area for 20 minutes, repeating in the evening. - Recommend considering occupational therapy for hand strengthening in the future. - Suggest trialing one joint health supplement for a month to assess efficacy. - Provide information on a modabber thumb brace for additional support.      Relevant Orders   [place] LIMITED JOINT SPACE STRUCTURES UP BILAT     Orders & Medications Medications: No orders of the defined types were placed in this encounter.  Orders Placed This Encounter  Procedures   [place] LIMITED JOINT SPACE STRUCTURES UP BILAT     No follow-ups on file.     Jerrol Banana, MD, [facility]   Primary Care Sports Medicine Primary Care and Sports Medicine at [facility]

## 2023-09-04 NOTE — Patient Instructions (Addendum)
 You have just been given a cortisone injection to reduce pain and inflammation. After the injection you may notice immediate relief of pain as a result of the Lidocaine. It is important to rest the area of the injection for 24 to 48 hours after the injection. There is a possibility of some temporary increased discomfort and swelling for up to 72 hours until the cortisone begins to work. If you do have pain, simply rest the joint and use ice. If you can tolerate over the counter medications, you can try Tylenol, Aleve, or Advil for added relief per package instructions. - Can obtain and use the (modabber thumb spica) brace as shown as-needed for additional support, remove nightly  Joint health supplements    Avoid omega-3 fatty acids, turmeric, and [place] with your ITP

## 2023-09-30 ENCOUNTER — Ambulatory Visit: Admission: EM | Admit: 2023-09-30 | Discharge: 2023-09-30 | Disposition: A | Discharge status: Home / Self Care

## 2023-09-30 DIAGNOSIS — K5900 Constipation, unspecified: Secondary | ICD-10-CM | POA: Diagnosis not present

## 2023-09-30 DIAGNOSIS — S0181XA Laceration without foreign body of other part of head, initial encounter: Secondary | ICD-10-CM | POA: Diagnosis not present

## 2023-09-30 MED ORDER — CEPHALEXIN 500 MG PO CAPS: 500.0000 mg | ORAL_CAPSULE | Freq: Three times a day (TID) | ORAL | 0 refills | Status: AC

## 2023-09-30 NOTE — ED Triage Notes (Signed)
 Patient states that she fell asleep in her recliner last night and when she got up quickly she fell around 4am and hit the right side of her forehead above her right eyebrow on the door frame inside her home.  Patient has laceration the her right eyebrow.  Patient has minimal bleeding at this time.

## 2023-09-30 NOTE — ED Provider Notes (Signed)
 [facility]    CSN: [id number] Arrival date & time: 09/30/23  0800      History   Chief Complaint Chief Complaint  Patient presents with   Fall   Head Injury   Facial Laceration    HPI Melissa Daniels is a 78 y.o. female.   HPI  78 year old female with past medical history significant for ITP, breast cancer, bilateral neuropathy, vertigo, high cholesterol, anxiety, arthritis of both hands, and vertigo presents for evaluation of a laceration above her right eye above.  She reports that she fell asleep in her recliner last night and then at 4 AM she got up to go to bed and wind up hitting the door frame.  She is not sure if she tripped or stumbled.  She did not have a loss of consciousness and she denies any headache, change in vision, nausea, or vomiting.  Very faint venous oozing is present.  She is not on any blood thinners.  Tdap was updated in February.  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   History of colonic polyps    Benign neoplasm of cecum    Benign neoplasm of  transverse colon    Benign neoplasm of ascending colon    Malignant neoplasm of upper-outer quadrant of left breast in female, estrogen receptor positive (HCC) 01/10/2017   Neuropathy 10/31/2016   Taking medication for chronic disease 10/31/2016   Primary osteoarthritis of right hip 10/31/2016   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HCC) 10/19/2015   Menopause 10/19/2015   Hormone replacement therapy (HRT) 10/19/2015   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   Routine general medical examination at a health care facility 10/10/2014   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   Female stress incontinence 10/10/2014   Colon polyp 10/10/2014   Episodic paroxysmal anxiety disorder 10/10/2014    Past Surgical History:  Procedure Laterality Date   BREAST BIOPSY Left 12/28/2016   stereo path pend   BREAST LUMPECTOMY     COLONOSCOPY WITH PROPOFOL  N/A 09/08/2017   Procedure: COLONOSCOPY WITH PROPOFOL ;  Surgeon: Marnee Sink, MD;  Location: [facility];  Service: Endoscopy;  Laterality: N/A;   COLONOSCOPY WITH PROPOFOL  N/A 09/03/2020   Procedure: COLONOSCOPY WITH PROPOFOL ;  Surgeon: Marnee Sink, MD;  Location: [facility];  Service: Endoscopy;  Laterality: N/A;  priority 4   EYE SURGERY     POLYPECTOMY  09/08/2017   Procedure: POLYPECTOMY;  Surgeon: Marnee Sink, MD;  Location: [facility];  Service: Endoscopy;;   SPLENECTOMY, TOTAL  OB History     Gravida  3   Para  3   Term  3   Preterm      AB      Living  3      SAB      IAB      Ectopic      Multiple      Live Births               Home Medications    Prior to Admission medications   Medication Sig Start Date End Date Taking? Authorizing Provider  cephALEXin  (KEFLEX ) 500 MG capsule Take 1 capsule (500 mg total) by mouth 3 (three) times daily for 5 days. 09/30/23 10/05/23 Yes Kent Pear, NP  solifenacin (VESICARE) 10 MG tablet Take 1 tablet by mouth daily. 09/20/23  09/19/24 Yes [provider]  ALPRAZolam  (XANAX ) 0.25 MG tablet Take 1 tablet (0.25 mg total) by mouth 2 (two) times daily as needed for anxiety. 04/06/23   Clarise Crooks, MD  Clotrimazole  1 % OINT Apply fingertip amount to affected areas in the morning and at bedtime 04/05/23   Roby, Micia, MD  cyanocobalamin (VITAMIN B12) 500 MCG tablet Take 500 mcg by mouth daily.    [provider]  DULoxetine (CYMBALTA) 60 MG capsule Take 60 mg by mouth daily. 06/25/21   [provider]  gabapentin  (NEURONTIN ) 300 MG capsule Take 1 capsule (300 mg total) by mouth at bedtime. Patient taking differently: Take 600 mg by mouth in the morning and at bedtime. [facility] doctor- morning and night 06/28/19   Clarise Crooks, MD  Multiple Vitamins-Minerals (OCUVITE ADULT 50+ PO) Take 1 tablet by mouth in the morning and at bedtime.    [provider]  oxybutynin  (DITROPAN -XL) 5 MG 24 hr tablet TAKE (1) TABLET BY MOUTH DAILY AT BEDTIME 06/19/23   Clarise Crooks, MD  simvastatin  (ZOCOR ) 20 MG tablet Take 1 tablet (20 mg total) by mouth at bedtime. 06/19/23   Clarise Crooks, MD  sodium chloride  (OCEAN) 0.65 % SOLN nasal spray Place 1 spray into both nostrils as needed for congestion. Patient not taking: Reported on 09/04/2023 06/26/23   Clarise Crooks, MD  triamcinolone  (KENALOG ) 0.025 % ointment Apply fingertip amount to affected areas in the morning and at bedtime Patient not taking: Reported on 09/04/2023 04/05/23   Sofia Dunn, MD  triamcinolone  (NASACORT ) 55 MCG/ACT AERO nasal inhaler Place 2 sprays into the nose daily. Patient not taking: Reported on 09/04/2023 06/26/23   Clarise Crooks, MD  vitamin E 400 UNIT capsule Take 400 Units by mouth daily.    [provider]    Family History Family History  Problem Relation Age of Onset   Heart disease Mother    Colon cancer Mother    Hypertension Mother    Heart disease Father    Hypertension Father    Breast cancer Neg Hx      Social History Social History   Tobacco Use   Smoking status: Never   Smokeless tobacco: Never  Vaping Use   Vaping status: Never Used  Substance Use Topics   Alcohol use: Not Currently   Drug use: Never     Allergies   Aspirin, Atorvastatin , Macrobid [nitrofurantoin], Nsaids, and Sulfa antibiotics   Review of Systems Review of Systems  Eyes:  Negative for visual disturbance.  Gastrointestinal:  Negative for nausea and vomiting.  Skin:  Positive for wound.  Neurological:  Negative for syncope  and headaches.     Physical Exam Triage Vital Signs ED Triage Vitals  Encounter Vitals Group     BP      Systolic BP Percentile      Diastolic BP Percentile      Pulse      Resp      Temp      Temp src      SpO2      Weight      Height      Head Circumference      Peak Flow      Pain Score      Pain Loc      Pain Education      Exclude from Growth Chart    No data found.  Updated Vital Signs BP 122/85 (BP Location: Left Arm)   Pulse 94   Temp 97.8 F (36.6 C) (Oral)   Resp 14   Ht 5\' 6"  (1.676 m)   Wt 151 lb 14.4 oz (68.9 kg)   SpO2 97%   BMI 24.52 kg/m   Visual Acuity Right Eye Distance:   Left Eye Distance:   Bilateral Distance:    Right Eye Near:   Left Eye Near:    Bilateral Near:     Physical Exam Vitals and nursing note reviewed.  Constitutional:      Appearance: Normal appearance. She is not ill-appearing.  HENT:     Head: Normocephalic.  Eyes:     General: No scleral icterus.    Extraocular Movements: Extraocular movements intact.     Conjunctiva/sclera: Conjunctivae normal.     Pupils: Pupils are equal, round, and reactive to light.  Skin:    General: Skin is warm and dry.     Capillary Refill: Capillary refill takes less than 2 seconds.  Neurological:     General: No focal deficit present.     Mental Status: She is alert and oriented to person, place, and time.     Sensory: No sensory deficit.      UC Treatments /  Results  Labs (all labs ordered are listed, but only abnormal results are displayed) Labs Reviewed - No data to display  EKG   Radiology No results found.  Procedures Procedures (including critical care time)  Medications Ordered in UC Medications - No data to display  Initial Impression / Assessment and Plan / UC Course  I have reviewed the triage vital signs and the nursing notes.  Pertinent labs & imaging results that were available during my care of the patient were reviewed by me and considered in my medical decision making (see chart for details).   Patient is a pleasant, nontoxic-appearing 78 year old female presenting for evaluation of laceration to the right side of her eyebrow as outlined HPI above.  Initially seen image above, the laceration extends from the middle of the orbit laterally nearly to the outer canthus.  There is no exposed bone.  EOM is intact and no evidence of entrapment.  Laceration is 3 cm in length.  The wound was anesthetized with 3 mL of 1% lidocaine  with epi and patient tolerated anesthesia well.  Once good anesthesia was achieved the wound was cleansed with chlorhexidine and saline, draped in a sterile fashion, and closed with 4 simple interrupted sutures of 4-0 Prolene.  The wound was again cleansed with chlorhexidine and saline and a thin smear of bacitracin ointment was applied.  Patient tolerated the procedure well.  I will discharge the patient home on  Keflex  500 mg 3 times daily x 5 days to prevent infection.  Sutures will remain in for 7 days.  Return precautions reviewed.  The patient is also inquiring about constipation as she reports that she has been dealing with this here lately.  She is taking senna without any improvement.  I suggested that she try MiraLAX, 17 g in 8 ounces of a beverage of her choice daily to help resolve her constipation.   Final Clinical Impressions(s) / UC Diagnoses   Final diagnoses:  Facial laceration, initial  encounter  Constipation, unspecified constipation type     Discharge Instructions      After 24 hours wash the wound with warm water  and soap, pat it dry, and apply a thin smear of bacitracin.  Clean the wound daily and apply bacitracin for the first 2 days.  After that a scab should have started to form and you can leave the wound open to air..  Take the Keflex  three times daily for 5 days for prevention of wound infection.  Sutures remain in place for 7 days, please return here or see your primary care provider for removal.  With regard to your constipation I recommend that you use over-the-counter MiraLAX.  Mix 1 capful in 8 ounces of a beverage of your choice and drink it down each morning, or each night before you go to bed, to allow more water  to be retained within the bowel, soften the stool, and help you resolve your constipation.  If you develop any redness at the wound site, swelling, pain, drainage, red streaks going up your hand, or fever please return for reevaluation.      ED Prescriptions     Medication Sig Dispense Auth. Provider   cephALEXin  (KEFLEX ) 500 MG capsule Take 1 capsule (500 mg total) by mouth 3 (three) times daily for 5 days. 15 capsule Kent Pear, NP      PDMP not reviewed this encounter.   Kent Pear, NP 09/30/23 [phone]

## 2023-09-30 NOTE — Discharge Instructions (Addendum)
 After 24 hours wash the wound with warm water  and soap, pat it dry, and apply a thin smear of bacitracin.  Clean the wound daily and apply bacitracin for the first 2 days.  After that a scab should have started to form and you can leave the wound open to air..  Take the Keflex  three times daily for 5 days for prevention of wound infection.  Sutures remain in place for 7 days, please return here or see your primary care provider for removal.  With regard to your constipation I recommend that you use over-the-counter MiraLAX.  Mix 1 capful in 8 ounces of a beverage of your choice and drink it down each morning, or each night before you go to bed, to allow more water  to be retained within the bowel, soften the stool, and help you resolve your constipation.  If you develop any redness at the wound site, swelling, pain, drainage, red streaks going up your hand, or fever please return for reevaluation.

## 2023-10-25 ENCOUNTER — Telehealth: Payer: Self-pay | Admitting: Family Medicine

## 2023-10-25 NOTE — Telephone Encounter (Signed)
 Please review above message.  JM

## 2023-10-25 NOTE — Telephone Encounter (Signed)
 Copied from CRM [id number]. Topic: Compliment - Provider (non-sensitive) >> Oct 25, 2023  1:25 PM Carla L wrote: Reason for CRM: Patient wanting to leave a message for Dr. Rochelle Chu. Patient wanting to wish her the best and that she will miss Dr. Rochelle Chu on her retirement.   Patient wants to thank Dr. Rochelle Chu for diagnosing her platelets and for taking care of her all these years. Patient wishes the very very best to Dr. Rochelle Chu.

## 2023-11-09 ENCOUNTER — Ambulatory Visit: Payer: Medicare Other

## 2023-12-12 ENCOUNTER — Ambulatory Visit
Admission: RE | Admit: 2023-12-12 | Discharge: 2023-12-12 | Disposition: A | Discharge status: Home / Self Care | Source: Ambulatory Visit | Attending: Family Medicine

## 2023-12-12 ENCOUNTER — Other Ambulatory Visit (INDEPENDENT_AMBULATORY_CARE_PROVIDER_SITE_OTHER): Payer: Self-pay | Admitting: Radiology

## 2023-12-12 ENCOUNTER — Ambulatory Visit
Admission: RE | Admit: 2023-12-12 | Discharge: 2023-12-12 | Disposition: A | Discharge status: Home / Self Care | Attending: Family Medicine | Admitting: Family Medicine

## 2023-12-12 ENCOUNTER — Other Ambulatory Visit: Admission: RE | Admit: 2023-12-12 | Source: Home / Self Care

## 2023-12-12 ENCOUNTER — Ambulatory Visit (INDEPENDENT_AMBULATORY_CARE_PROVIDER_SITE_OTHER): Admitting: Family Medicine

## 2023-12-12 ENCOUNTER — Encounter: Payer: Self-pay | Admitting: Family Medicine

## 2023-12-12 VITALS — BP 120/72 | HR 82 | Ht 66.0 in | Wt 156.6 lb

## 2023-12-12 DIAGNOSIS — M19049 Primary osteoarthritis, unspecified hand: Secondary | ICD-10-CM

## 2023-12-12 DIAGNOSIS — M25552 Pain in left hip: Secondary | ICD-10-CM | POA: Diagnosis not present

## 2023-12-12 DIAGNOSIS — M19041 Primary osteoarthritis, right hand: Secondary | ICD-10-CM | POA: Diagnosis not present

## 2023-12-12 DIAGNOSIS — M25551 Pain in right hip: Secondary | ICD-10-CM

## 2023-12-12 DIAGNOSIS — M19042 Primary osteoarthritis, left hand: Secondary | ICD-10-CM | POA: Diagnosis not present

## 2023-12-12 MED ORDER — TRIAMCINOLONE ACETONIDE 40 MG/ML IJ SUSP
40.0000 mg | Freq: Once | INTRAMUSCULAR | Status: AC
  Administered 2023-12-12: 40 mg via INTRAMUSCULAR

## 2023-12-12 NOTE — Progress Notes (Signed)
 Primary Care / Sports Medicine Office Visit  Patient Information:  Patient ID: Annalisa Colonna, female DOB: Jul 15, 1945 Age: 78 y.o. MRN: [id number]   Roxene Alviar is a pleasant 78 y.o. female presenting with the following:  Chief Complaint  Patient presents with   Hand Pain    Patient presents today for bil CMC injections. Her last one she had was in April. The injections gave her significant relief up until a few weeks ago.    Vitals:   12/12/23 0954  BP: 120/72  Pulse: 82  SpO2: 96%   Vitals:   12/12/23 0954  Weight: 156 lb 9.6 oz (71 kg)  Height: 5' 6 (1.676 m)   Body mass index is 25.28 kg/m.  No results found.   Independent interpretation of notes and tests performed by another provider:   None  Procedures performed:   Procedure:  Injection of right hand under ultrasound guidance. Ultrasound guidance utilized for out of plane approach to first [facility], cortical irregularity consistent with osteoarthritis noted Samsung HS60 device utilized with permanent recording / reporting. Verbal informed consent obtained and verified. Skin prepped in a sterile fashion. Ethyl chloride for topical local analgesia.  Completed without difficulty and tolerated well. Medication: triamcinolone  acetonide 40 mg/mL suspension for injection 1 mL total and 0.5 mL lidocaine  1% without epinephrine utilized for needle placement anesthetic Advised to contact for fevers/chills, erythema, induration, drainage, or persistent bleeding.   Procedure:  Injection of left hand under ultrasound guidance. Ultrasound guidance utilized for out of plane approach to first CMC, dynamic joint motion visualized Samsung HS60 device utilized with permanent recording / reporting. Verbal informed consent obtained and verified. Skin prepped in a sterile fashion. Ethyl chloride for topical local analgesia.  Completed without difficulty and tolerated well. Medication: triamcinolone  acetonide 40 mg/mL suspension for  injection 1 mL total and 0.5 mL lidocaine  1% without epinephrine utilized for needle placement anesthetic Advised to contact for fevers/chills, erythema, induration, drainage, or persistent bleeding.  Pertinent History, Exam, Impression, and Recommendations:   Problem List Items Addressed This Visit     Localized primary osteoarthritis of carpometacarpal [facility]) joint - Primary   Bilateral first CMC osteoarthritis related arthralgia -Previous injections performed on 09/04/2023 provided roughly 2+ months of full symptom control -Has been applying Aspercreme and Voltaren gel for interim pain relief -Requesting corticosteroid injections  Plan -Tolerated ultrasound-guided bilateral first CMC cortisone injections -Advise relative rest x 2 days and gradual return normal activity -Apply ice at 20-minute intervals when home, before bedtime, and as needed -Can dose over-the-counter medications for pain control if needed -Can follow-up on an as-needed basis for this issue.  If wanting repeat injections in the future, contact our office for preprocedural pain control medication Rx.      Relevant Orders   US  LIMITED JOINT SPACE STRUCTURES UP BILAT   Pain of both hip joints   Hip pain and instability - Pain localized to the groin, right greater than left, affecting both legs - Sensation of leg giving way, with episodes severe enough to require sitting to avoid falling  Physical Exam Hips: Positive FADIR bilaterally, particularly at end range of internal rotation, and groin tenderness present.  Osteoarthritis of hips Chronic osteoarthritis related arthralgia and instability. - Order hip x-ray to assess osteoarthritis status. - Consider cortisone injection post x-ray review. - Discuss rollator use for stability and fall prevention.      Relevant Orders   DG HIPS BILAT W OR W/O PELVIS MIN 5 VIEWS  Orders & Medications Medications:  Meds ordered this encounter  Medications    triamcinolone  acetonide (KENALOG -40) injection 40 mg   Orders Placed This Encounter  Procedures   US  LIMITED JOINT SPACE STRUCTURES UP BILAT   DG HIPS BILAT W OR W/O PELVIS MIN 5 VIEWS     No follow-ups on file.     Selinda JINNY Ku, MD, [facility]   Primary Care Sports Medicine Primary Care and Sports Medicine at [facility]

## 2023-12-12 NOTE — Patient Instructions (Signed)
 You have just been given a cortisone injection to reduce pain and inflammation. After the injection you may notice immediate relief of pain as a result of the Lidocaine . It is important to rest the area of the injection for 24 to 48 hours after the injection. There is a possibility of some temporary increased discomfort and swelling for up to 72 hours until the cortisone begins to work. If you do have pain, simply rest the joint and use ice. If you can tolerate over the counter medications, you can try Tylenol , Aleve, or Advil for added relief per package instructions. Patient Action Plan:  1. [facility] Joint Osteoarthritis:    - Rest your hands for 2 days after receiving cortisone injections.    - Gradually return to normal activities.    - Apply ice for 20 minutes at a time when at home, before bed, and as needed.    - Use over-the-counter pain medications if necessary.    - Contact the office if you wish to schedule repeat injections in the future.  2. Hip Pain and Instability:    - Undergo a hip x-ray to evaluate the status of osteoarthritis.    - Consider cortisone injections after reviewing x-ray results.    - Discuss using a rollator for stability and to prevent falls.  3. Symptoms to Watch:    - If you experience any new or worsening symptoms, contact the office for further guidance.

## 2023-12-12 NOTE — Assessment & Plan Note (Addendum)
 Hip pain and instability - Pain localized to the groin, right greater than left, affecting both legs - Sensation of leg giving way, with episodes severe enough to require sitting to avoid falling  Physical Exam Hips: Positive FADIR bilaterally, particularly at end range of internal rotation, and groin tenderness present.  Osteoarthritis of hips Chronic osteoarthritis related arthralgia and instability. - Order hip x-ray to assess osteoarthritis status. - Consider cortisone injection post x-ray review. - Discuss rollator use for stability and fall prevention.

## 2023-12-12 NOTE — Assessment & Plan Note (Signed)
 Bilateral first CMC osteoarthritis related arthralgia -Previous injections performed on 09/04/2023 provided roughly 2+ months of full symptom control -Has been applying Aspercreme and Voltaren gel for interim pain relief -Requesting corticosteroid injections  Plan -Tolerated ultrasound-guided bilateral first CMC cortisone injections -Advise relative rest x 2 days and gradual return normal activity -Apply ice at 20-minute intervals when home, before bedtime, and as needed -Can dose over-the-counter medications for pain control if needed -Can follow-up on an as-needed basis for this issue.  If wanting repeat injections in the future, contact our office for preprocedural pain control medication Rx.

## 2023-12-15 ENCOUNTER — Ambulatory Visit: Payer: Self-pay | Admitting: Family Medicine

## 2024-02-15 NOTE — Anesthesia Preprocedure Evaluation (Addendum)
 Anesthesia Evaluation  Patient identified by MRN, date of birth, ID band Patient awake    Reviewed: Allergy & Precautions, H&P , NPO status , Patient's Chart, lab work & pertinent test results  Airway Mallampati: II  TM Distance: >3 FB Neck ROM: Full    Dental no notable dental hx. (+) Caps Caps upper central incisors :   Pulmonary neg pulmonary ROS   Pulmonary exam normal breath sounds clear to auscultation       Cardiovascular negative cardio ROS Normal cardiovascular exam Rhythm:Regular Rate:Normal     Neuro/Psych  PSYCHIATRIC DISORDERS Anxiety      Neuromuscular disease negative neurological ROS  negative psych ROS   GI/Hepatic negative GI ROS, Neg liver ROS,,,  Endo/Other  negative endocrine ROS    Renal/GU negative Renal ROS  negative genitourinary   Musculoskeletal negative musculoskeletal ROS (+) Arthritis ,    Abdominal   Peds negative pediatric ROS (+)  Hematology negative hematology ROS (+)   Anesthesia Other Findings  ITP (idiopathic thrombocytopenic purpura) Hypercholesteremia Neuropathy  Osteoporosis Anxiety  Arthritis Vertigo  Malignant neoplasm of upper-outer quadrant of left breast in female, estrogen receptor positive (HCC) Double vision     Reproductive/Obstetrics negative OB ROS                              Anesthesia Physical Anesthesia Plan  ASA: 3  Anesthesia Plan: MAC   Post-op Pain Management:    Induction: Intravenous  PONV Risk Score and Plan:   Airway Management Planned: Natural Airway and Nasal Cannula  Additional Equipment:   Intra-op Plan:   Post-operative Plan:   Informed Consent: I have reviewed the patients History and Physical, chart, labs and discussed the procedure including the risks, benefits and alternatives for the proposed anesthesia with the patient or authorized representative who has indicated his/her understanding and  acceptance.     Dental Advisory Given  Plan Discussed with: Anesthesiologist, CRNA and Surgeon  Anesthesia Plan Comments: (Patient consented for risks of anesthesia including but not limited to:  - adverse reactions to medications - damage to eyes, teeth, lips or other oral mucosa - nerve damage due to positioning  - sore throat or hoarseness - Damage to heart, brain, nerves, lungs, other parts of body or loss of life  Patient voiced understanding and assent.)         Anesthesia Quick Evaluation

## 2024-02-21 ENCOUNTER — Encounter: Payer: Self-pay | Admitting: Ophthalmology

## 2024-02-22 ENCOUNTER — Telehealth: Payer: Self-pay | Admitting: Internal Medicine

## 2024-02-22 NOTE — Telephone Encounter (Signed)
 Copied from CRM [id number]. Topic: Appointments - Scheduling Inquiry for Clinic >> Feb 22, 2024  8:51 AM Melissa Daniels wrote: Reason for CRM: The patient wants to make an appt to get an arthritis shot in both of her hands. The patient would like to make the appt w/ Dr. Alvia. Please contact the patient at [phone]

## 2024-02-22 NOTE — Telephone Encounter (Signed)
 Last injections for bil hands were 12/12/23. Are you okay with giving her injection this soon? 20 days from 3 months.

## 2024-02-22 NOTE — Discharge Instructions (Signed)

## 2024-02-23 ENCOUNTER — Ambulatory Visit: Payer: Self-pay

## 2024-02-23 ENCOUNTER — Telehealth: Payer: Self-pay

## 2024-02-23 NOTE — Telephone Encounter (Signed)
 Copied from CRM [id number]. Topic: Clinical - Medical Advice >> Feb 23, 2024  3:54 PM Amy B wrote: Reason for CRM: Patient states she still has not received a call back regarding receiving injections in her hands for pain.  She called yesterday and again today and states has not heard back.  [phone]

## 2024-02-23 NOTE — Telephone Encounter (Signed)
 FYI Only or Action Required?: Action required by provider: Request information about getting a shot for symptoms.  Patient was last seen in primary care on 12/12/2023 by Alvia Selinda PARAS, MD.  Called Nurse Triage reporting Hand Pain.  Symptoms began several weeks ago.  Interventions attempted: OTC medications: Aspercreme and Prescription medications: Volatren.  Symptoms are: gradually worsening.  Triage Disposition: See PCP Within 2 Weeks  Patient/caregiver understands and will follow disposition?: No, wishes to speak with PCP   Copied from CRM [id number]. Topic: Clinical - Red Word Triage >> Feb 23, 2024  9:30 AM Turkey B wrote: [facility] that prompted transfer to Nurse Triage: patient hs severe pain and both hands Reason for Disposition  Hand pain is a chronic symptom (recurrent or ongoing AND present > 4 weeks)  Answer Assessment - Initial Assessment Questions Patient states she receives a shot for symptoms. Currently using Voltaren and aspercreme for symptoms.  Patient offered an appointment in office but declined and states she would like to know if she can receive the injection first before scheduling an appointment. Patient requesting a call back regarding her request.   1. ONSET: When did the pain start?     A few weeks ago  2. LOCATION: Where is the pain located?     Thumbs bilateral L worst than R  3. PAIN: How bad is the pain? (Scale 1-10; or mild, moderate, severe)     8/10 4. OTHER SYMPTOMS: Do you have any other symptoms? (e.g., fever, neck pain, numbness or tingling, rash, swelling)     Denies  Protocols used: Hand Pain-A-AH

## 2024-02-23 NOTE — Telephone Encounter (Signed)
 Please review.  KP

## 2024-02-26 ENCOUNTER — Ambulatory Visit: Payer: Self-pay | Admitting: Anesthesiology

## 2024-02-26 ENCOUNTER — Encounter
Admission: RE | Disposition: A | Discharge status: Home / Self Care | Payer: Self-pay | Source: Home / Self Care | Attending: Ophthalmology

## 2024-02-26 ENCOUNTER — Encounter: Payer: Self-pay | Admitting: Ophthalmology

## 2024-02-26 ENCOUNTER — Ambulatory Visit
Admission: RE | Admit: 2024-02-26 | Discharge: 2024-02-26 | Disposition: A | Discharge status: Home / Self Care | Attending: Ophthalmology | Admitting: Ophthalmology

## 2024-02-26 ENCOUNTER — Other Ambulatory Visit: Payer: Self-pay

## 2024-02-26 DIAGNOSIS — H2512 Age-related nuclear cataract, left eye: Secondary | ICD-10-CM | POA: Diagnosis present

## 2024-02-26 DIAGNOSIS — M1990 Unspecified osteoarthritis, unspecified site: Secondary | ICD-10-CM | POA: Insufficient documentation

## 2024-02-26 DIAGNOSIS — F419 Anxiety disorder, unspecified: Secondary | ICD-10-CM | POA: Diagnosis not present

## 2024-02-26 HISTORY — PX: CATARACT EXTRACTION W/PHACO: SHX586

## 2024-02-26 HISTORY — DX: Prediabetes: R73.03

## 2024-02-26 SURGERY — PHACOEMULSIFICATION, CATARACT, WITH IOL INSERTION
Anesthesia: Monitor Anesthesia Care | Laterality: Left

## 2024-02-26 MED ORDER — SIGHTPATH DOSE#1 NA HYALUR & NA CHOND-NA HYALUR IO KIT
PACK | INTRAOCULAR | Status: DC | PRN
  Administered 2024-02-26: 1 via OPHTHALMIC

## 2024-02-26 MED ORDER — CYCLOPENTOLATE HCL 2 % OP SOLN
1.0000 [drp] | OPHTHALMIC | Status: AC
  Administered 2024-02-26 (×3): 1 [drp] via OPHTHALMIC

## 2024-02-26 MED ORDER — ARMC OPHTHALMIC DILATING DROPS
OPHTHALMIC | Status: AC
  Filled 2024-02-26: qty 0.5

## 2024-02-26 MED ORDER — LACTATED RINGERS IV SOLN: INTRAVENOUS | Status: DC

## 2024-02-26 MED ORDER — MIDAZOLAM HCL 2 MG/2ML IJ SOLN
INTRAMUSCULAR | Status: AC
  Filled 2024-02-26: qty 2

## 2024-02-26 MED ORDER — MIDAZOLAM HCL 2 MG/2ML IJ SOLN
INTRAMUSCULAR | Status: DC | PRN
  Administered 2024-02-26: 1 mg via INTRAVENOUS

## 2024-02-26 MED ORDER — SIGHTPATH DOSE#1 BSS IO SOLN
INTRAOCULAR | Status: DC | PRN
  Administered 2024-02-26: 15 mL via INTRAOCULAR

## 2024-02-26 MED ORDER — FENTANYL CITRATE (PF) 100 MCG/2ML IJ SOLN
INTRAMUSCULAR | Status: AC
  Filled 2024-02-26: qty 2

## 2024-02-26 MED ORDER — TETRACAINE HCL 0.5 % OP SOLN
1.0000 [drp] | OPHTHALMIC | Status: AC
  Administered 2024-02-26 (×3): 1 [drp] via OPHTHALMIC

## 2024-02-26 MED ORDER — TETRACAINE HCL 0.5 % OP SOLN
OPHTHALMIC | Status: AC
  Filled 2024-02-26: qty 4

## 2024-02-26 MED ORDER — LIDOCAINE HCL (PF) 2 % IJ SOLN
INTRAOCULAR | Status: DC | PRN
  Administered 2024-02-26: 1 mL via INTRAOCULAR

## 2024-02-26 MED ORDER — PHENYLEPHRINE HCL 10 % OP SOLN
1.0000 [drp] | OPHTHALMIC | Status: AC
  Administered 2024-02-26 (×3): 1 [drp] via OPHTHALMIC

## 2024-02-26 MED ORDER — PHENYLEPHRINE HCL 10 % OP SOLN
OPHTHALMIC | Status: AC
  Filled 2024-02-26: qty 5

## 2024-02-26 MED ORDER — MOXIFLOXACIN HCL 0.5 % OP SOLN
OPHTHALMIC | Status: DC | PRN
  Administered 2024-02-26: .2 mL via OPHTHALMIC

## 2024-02-26 MED ORDER — FENTANYL CITRATE (PF) 100 MCG/2ML IJ SOLN
INTRAMUSCULAR | Status: DC | PRN
  Administered 2024-02-26: 50 ug via INTRAVENOUS

## 2024-02-26 MED ORDER — SIGHTPATH DOSE#1 BSS IO SOLN
INTRAOCULAR | Status: DC | PRN
  Administered 2024-02-26: 100 mL via OPHTHALMIC

## 2024-02-26 MED ORDER — CYCLOPENTOLATE HCL 2 % OP SOLN
OPHTHALMIC | Status: AC
  Filled 2024-02-26: qty 2

## 2024-02-26 SURGICAL SUPPLY — 9 items
DISSECTOR HYDRO NUCLEUS 50X22 (MISCELLANEOUS) ×1 IMPLANT
FEE CATARACT SUITE SIGHTPATH (MISCELLANEOUS) ×1 IMPLANT
GLOVE PI ULTRA LF STRL 7.5 (GLOVE) ×1 IMPLANT
GLOVE SURG SYN 6.5 PF PI BL (GLOVE) ×1 IMPLANT
GLOVE SURG SYN 8.5 PF PI BL (GLOVE) ×1 IMPLANT
LENS IOL TECNIS EYHANCE 15.5 (Intraocular Lens) IMPLANT
NDL FILTER BLUNT 18X1 1/2 (NEEDLE) ×1 IMPLANT
NEEDLE FILTER BLUNT 18X1 1/2 (NEEDLE) ×1 IMPLANT
SYR 3ML LL SCALE MARK (SYRINGE) ×1 IMPLANT

## 2024-02-26 NOTE — H&P (Signed)
 [facility]   Primary Care Physician:  Salli Amato, MD Ophthalmologist: Dr. Adine Novak  Pre-Procedure History & Physical: HPI:  Melissa Daniels is a 78 y.o. female here for cataract surgery.   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   Neuropathy    bilateral feet   Osteoporosis    Pre-diabetes    Vertigo    several episodes per year    Past Surgical History:  Procedure Laterality Date   BREAST BIOPSY Left 12/28/2016   stereo path pend   BREAST LUMPECTOMY     COLONOSCOPY WITH PROPOFOL  N/A 09/08/2017   Procedure: COLONOSCOPY WITH PROPOFOL ;  Surgeon: Jinny Carmine, MD;  Location: [facility];  Service: Endoscopy;  Laterality: N/A;   COLONOSCOPY WITH PROPOFOL  N/A 09/03/2020   Procedure: COLONOSCOPY WITH PROPOFOL ;  Surgeon: Jinny Carmine, MD;  Location: [facility];  Service: Endoscopy;  Laterality: N/A;  priority 4   EYE SURGERY     POLYPECTOMY  09/08/2017   Procedure: POLYPECTOMY;  Surgeon: Jinny Carmine, MD;  Location: [facility];  Service: Endoscopy;;   SPLENECTOMY, TOTAL      Prior to Admission medications   Medication Sig Start Date End Date Taking? Authorizing Provider  acetaminophen  (TYLENOL ) 325 MG tablet Take 650 mg by mouth at bedtime.   Yes [provider]  ALPRAZolam  (XANAX ) 0.25 MG tablet Take 1 tablet (0.25 mg total) by mouth 2 (two) times daily as needed for anxiety. 04/06/23  Yes Joshua Cathryne BROCKS, MD  Calcium -Magnesium-Zinc-Vit D3 (CALCIUM -MAGNESIUM-ZINC-D3) 333 MG-133 MG-5 MG-5 MCG TABS Take 1 tablet by mouth daily.   Yes [provider]  cyanocobalamin (VITAMIN B12) 500 MCG tablet Take 500 mcg by mouth daily.   Yes [provider]  DULoxetine (CYMBALTA) 60 MG capsule Take 60 mg by mouth  daily. 06/25/21  Yes [provider]  gabapentin  (NEURONTIN ) 300 MG capsule Take 1 capsule (300 mg total) by mouth at bedtime. Patient taking differently: Take 600 mg by mouth in the morning and at bedtime. [facility] doctor- morning and night 06/28/19  Yes Jones, Deanna C, MD  Misc Natural Products (GLUCOSAMINE CHOND COMPLEX/MSM PO) Take 1 tablet by mouth daily.   Yes [provider]  Multiple Vitamins-Minerals (OCUVITE ADULT 50+ PO) Take 1 tablet by mouth in the morning and at bedtime.   Yes [provider]  Simethicone  (GAS RELIEF 125 MAX ST PO) Take 2 tablets by mouth 2 (two) times daily.   Yes [provider]  simvastatin  (ZOCOR ) 20 MG tablet Take 1 tablet (20 mg total) by mouth at bedtime. 06/19/23  Yes Jones, Deanna C, MD  solifenacin (VESICARE) 10 MG tablet Take 1 tablet by mouth daily. 09/20/23 09/19/24 Yes [provider]  vitamin E 400 UNIT capsule Take 400 Units by mouth daily.   Yes [provider]    Allergies as of 02/13/2024 - Review Complete 12/12/2023  Allergen Reaction Noted   Aspirin  11/28/2017   Atorvastatin   12/13/2022   Macrobid [nitrofurantoin]  06/28/2015   Nsaids Other (See Comments) 11/20/2017   Sulfa antibiotics Other (See Comments) 06/28/2015    Family History  Problem Relation Age of Onset   Heart disease Mother    Colon cancer Mother    Hypertension Mother  Heart disease Father    Hypertension Father    Breast cancer Neg Hx     Social History   Socioeconomic History   Marital status: Widowed    Spouse name: Not on file   Number of children: 3   Years of education: 87   Highest education level: 12th grade  Occupational History   Occupation: Retired  Tobacco Use   Smoking status: Never   Smokeless tobacco: Never  Vaping Use   Vaping status: Never Used  Substance and Sexual Activity   Alcohol use: Never   Drug use: Never   Sexual activity: Not Currently    Partners: Male  Other Topics Concern    Not on file  Social History Narrative   Pt lives alone   Social Drivers of Health   Financial Resource Strain: Low Risk  (12/11/2023)   Overall Financial Resource Strain (CARDIA)    Difficulty of Paying Living Expenses: Not hard at all  Food Insecurity: Unknown (12/11/2023)   Hunger Vital Sign    Worried About Running Out of Food in the Last Year: Never true    Ran Out of Food in the Last Year: Not on file  Transportation Needs: No Transportation Needs (12/11/2023)   PRAPARE - Administrator, Civil Service (Medical): No    Lack of Transportation (Non-Medical): No  Physical Activity: Insufficiently Active (12/11/2023)   Exercise Vital Sign    Days of Exercise per Week: 4 days    Minutes of Exercise per Session: 10 min  Stress: No Stress Concern Present (12/11/2023)   [employer] of Occupational Health - Occupational Stress Questionnaire    Feeling of Stress: Not at all  Social Connections: Moderately Integrated (12/11/2023)   Social Connection and Isolation Panel    Frequency of Communication with Friends and Family: More than three times a week    Frequency of Social Gatherings with Friends and Family: Not on file    Attends Religious Services: More than 4 times per year    Active Member of [employer] or Organizations: Yes    Attends Banker Meetings: Not on file    Marital Status: Widowed  Intimate Partner Violence: Not At Risk (10/12/2022)   Humiliation, Afraid, Rape, and Kick questionnaire    Fear of Current or Ex-Partner: No    Emotionally Abused: No    Physically Abused: No    Sexually Abused: No    Review of Systems: See HPI, otherwise negative ROS  Physical Exam: BP 120/71   Pulse 74   Temp (!) 97 F (36.1 C) (Temporal)   Resp 12   Ht 5' 6 (1.676 m)   Wt 70.3 kg   SpO2 98%   BMI 25.02 kg/m  General:   Alert, cooperative. Head:  Normocephalic and atraumatic. Respiratory:  Normal work of breathing. Cardiovascular:   NAD  Impression/Plan: Melissa Daniels is here for cataract surgery.  Risks, benefits, limitations, and alternatives regarding cataract surgery have been reviewed with the patient.  Questions have been answered.  All parties agreeable.   Adine Novak, MD  02/26/2024, 8:57 AM

## 2024-02-26 NOTE — Telephone Encounter (Signed)
 Please review.  KP

## 2024-02-26 NOTE — Transfer of Care (Signed)
 Immediate Anesthesia Transfer of Care Note  Patient: Melissa Daniels  Procedure(s) Performed: PHACOEMULSIFICATION, CATARACT, WITH IOL INSERTION 7.81, 00:42.5 (Left)  Patient Location: PACU  Anesthesia Type: MAC  Level of Consciousness: awake, alert  and patient cooperative  Airway and Oxygen Therapy: Patient Spontanous Breathing and Patient connected to supplemental oxygen  Post-op Assessment: Post-op Vital signs reviewed, Patient's Cardiovascular Status Stable, Respiratory Function Stable, Patent Airway and No signs of Nausea or vomiting  Post-op Vital Signs: Reviewed and stable  Complications: No notable events documented.

## 2024-02-26 NOTE — Anesthesia Postprocedure Evaluation (Signed)
 Anesthesia Post Note  Patient: Melissa Daniels  Procedure(s) Performed: PHACOEMULSIFICATION, CATARACT, WITH IOL INSERTION 7.81, 00:42.5 (Left)  Patient location during evaluation: PACU Anesthesia Type: MAC Level of consciousness: awake and alert Pain management: pain level controlled Vital Signs Assessment: post-procedure vital signs reviewed and stable Respiratory status: spontaneous breathing, nonlabored ventilation, respiratory function stable and patient connected to nasal cannula oxygen Cardiovascular status: stable and blood pressure returned to baseline Postop Assessment: no apparent nausea or vomiting Anesthetic complications: no   No notable events documented.   Last Vitals:  Vitals:   02/26/24 0929 02/26/24 0935  BP: 120/68 120/70  Pulse: 75 71  Resp: 13 16  Temp: (!) 36.2 C (!) 36.2 C  SpO2: 96% 94%    Last Pain:  Vitals:   02/26/24 0935  TempSrc:   PainSc: 0-No pain                 Maie Kesinger C Randee Huston

## 2024-02-26 NOTE — Op Note (Signed)
 OPERATIVE NOTE  Melissa Daniels [id number] 02/26/2024   PREOPERATIVE DIAGNOSIS:  Nuclear sclerotic cataract left eye.  H25.12   POSTOPERATIVE DIAGNOSIS:    Nuclear sclerotic cataract left eye.     PROCEDURE:  Phacoemusification with posterior chamber intraocular lens placement of the left eye   LENS:   Implant Name Type Inv. Item Serial No. Manufacturer Lot No. LRB No. Used Action  LENS IOL TECNIS EYHANCE 15.5 - [id number] Intraocular Lens LENS IOL TECNIS EYHANCE 15.5 [id number] SIGHTPATH  Left 1 Implanted      Procedure(s): PHACOEMULSIFICATION, CATARACT, WITH IOL INSERTION 7.81, 00:42.5 (Left)  SURGEON:  Adine Novak, MD, MPH   ANESTHESIA:  Topical with tetracaine drops augmented with 1% preservative-free intracameral lidocaine .  ESTIMATED BLOOD LOSS: <1 mL   COMPLICATIONS:  None.   DESCRIPTION OF PROCEDURE:  The patient was identified in the holding room and transported to the operating room and placed in the supine position under the operating microscope.  The left eye was identified as the operative eye and it was prepped and draped in the usual sterile ophthalmic fashion.   A 1.0 millimeter clear-corneal paracentesis was made at the 5:00 position. 0.5 ml of preservative-free 1% lidocaine  with epinephrine was injected into the anterior chamber.  The anterior chamber was filled with viscoelastic.  A 2.4 millimeter keratome was used to make a near-clear corneal incision at the 2:00 position.  A curvilinear capsulorrhexis was made with a cystotome and capsulorrhexis forceps.  Balanced salt solution was used to hydrodissect and hydrodelineate the nucleus.   Phacoemulsification was then used in stop and chop fashion to remove the lens nucleus and epinucleus.  The remaining cortex was then removed using the irrigation and aspiration handpiece. Viscoelastic was then placed into the capsular bag to distend it for lens placement.  A lens was then injected into the capsular bag.  The remaining  viscoelastic was aspirated.   Wounds were hydrated with balanced salt solution.  The anterior chamber was inflated to a physiologic pressure with balanced salt solution.  Intracameral vigamox 0.1 mL undiltued was injected into the eye and a drop placed onto the ocular surface.  No wound leaks were noted.  The patient was taken to the recovery room in stable condition without complications of anesthesia or surgery  Adine Novak 02/26/2024, 9:26 AM

## 2024-02-27 ENCOUNTER — Encounter: Payer: Self-pay | Admitting: Ophthalmology

## 2024-02-27 ENCOUNTER — Other Ambulatory Visit (INDEPENDENT_AMBULATORY_CARE_PROVIDER_SITE_OTHER): Payer: Self-pay | Admitting: Radiology

## 2024-02-27 ENCOUNTER — Ambulatory Visit: Admitting: Family Medicine

## 2024-02-27 ENCOUNTER — Encounter: Payer: Self-pay | Admitting: Family Medicine

## 2024-02-27 ENCOUNTER — Ambulatory Visit (INDEPENDENT_AMBULATORY_CARE_PROVIDER_SITE_OTHER): Admitting: Family Medicine

## 2024-02-27 VITALS — BP 108/70 | HR 81 | Ht 66.0 in | Wt 157.0 lb

## 2024-02-27 DIAGNOSIS — M180 Bilateral primary osteoarthritis of first carpometacarpal joints: Secondary | ICD-10-CM

## 2024-02-27 DIAGNOSIS — M19049 Primary osteoarthritis, unspecified hand: Secondary | ICD-10-CM

## 2024-02-27 MED ORDER — TRIAMCINOLONE ACETONIDE 40 MG/ML IJ SUSP: 80.0000 mg | Freq: Once | INTRAMUSCULAR | Status: DC

## 2024-02-27 MED ORDER — TRIAMCINOLONE ACETONIDE 40 MG/ML IJ SUSP
40.0000 mg | Freq: Once | INTRAMUSCULAR | Status: AC
  Administered 2024-02-27: 40 mg via INTRAMUSCULAR

## 2024-02-27 NOTE — Assessment & Plan Note (Signed)
 History of Present Illness Melissa Daniels is a 78 year old female with arthritis who presents for management of bilateral hand pain. She is accompanied by her daughter.  Hand arthralgia - Significant pain in both hands, predominantly in the 1st [facility] joints - Left hand more painful than right - Pain is persistent and impacts daily activities  Response to prior therapies - Cortisone injections previously provided relief but have become less effective over time - Uses Voltaren gel up to four times daily, especially at night, with beneficial effect - Uses Aspercreme for quicker relief  Chronic musculoskeletal pain and neuropathy - Currently taking Cymbalta (duloxetine) 60 mg for chronic musculoskeletal pain - Gabapentin  used for neuropathy in the feet  Assessment and Plan 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 Cortisone injections effective but may be losing efficacy. Discussed LDRT as a new treatment option. - Administer cortisone injections to bilateral hands under ultrasound guidance. - Discuss potential future use of LDRT. - Continue Voltaren gel up to four times daily. - Use Aspercreme as needed for pain relief. - Consider prednisone  pack for acute flare-ups.

## 2024-02-27 NOTE — Patient Instructions (Signed)
 VISIT SUMMARY:  Today, we discussed the management of your hand pain due to arthritis. We reviewed your current treatments and explored new options to help manage your symptoms more effectively.  YOUR PLAN:  OSTEOARTHRITIS OF BILATERAL HANDS: You have chronic osteoarthritis in both hands -Administered cortisone injections to both hands. -Discussed the potential future use of Low-Dose Radiation Therapy (LDRT) as a new treatment option. -Continue using Voltaren gel up to four times daily. -Use Aspercreme as needed for pain relief. -Consider using a prednisone  pack for acute flare-ups. -Continue taking Cymbalta 60 mg daily. -You may use gabapentin  if needed. -Rest your hands when they are aching.

## 2024-02-27 NOTE — Progress Notes (Signed)
 Primary Care / Sports Medicine Office Visit  Patient Information:  Patient ID: Melissa Daniels, female DOB: [birth year] Age: 78 y.o. MRN: [id number]   Melissa Daniels is a pleasant 78 y.o. female presenting with the following:  Chief Complaint  Patient presents with   Hand Pain    Bil thumb pain CMC joint. Patient requesting cortisone injections.     Vitals:   02/27/24 1419  BP: 108/70  Pulse: 81  SpO2: 96%   Vitals:   02/27/24 1419  Weight: 157 lb (71.2 kg)  Height: 5' 6 (1.676 m)   Body mass index is 25.34 kg/m.  No results found.   Discussed the use of AI scribe software for clinical note transcription with the patient, who gave verbal consent to proceed.   Independent interpretation of notes and tests performed by another provider:   None  Procedures performed:   Procedure:  Injection of left thumb under ultrasound guidance. Ultrasound guidance utilized for out-of-plane approach to 1st CMC, cortical roughening noted Samsung HS60 device utilized with permanent recording / reporting. Verbal informed consent obtained and verified. Skin prepped in a sterile fashion. Ethyl chloride for topical local analgesia.  Completed without difficulty and tolerated well. Medication: triamcinolone  acetonide 40 mg/mL suspension for injection 0.25 mL total and 0.25 mL lidocaine  1% without epinephrine utilized for needle placement anesthetic Advised to contact for fevers/chills, erythema, induration, drainage, or persistent bleeding.  Procedure:  Injection of right thumb under ultrasound guidance. Ultrasound guidance utilized for 1st CMC, dynamic joitn motion noted Samsung HS60 device utilized with permanent recording / reporting. Verbal informed consent obtained and verified. Skin prepped in a sterile fashion. Ethyl chloride for topical local analgesia.  Completed without difficulty and tolerated well. Medication: triamcinolone  acetonide 40 mg/mL suspension for injection 0.25 mL  total and 0.25 mL lidocaine  1% without epinephrine utilized for needle placement anesthetic Advised to contact for fevers/chills, erythema, induration, drainage, or persistent bleeding.   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 She is accompanied by her daughter.  Hand arthralgia - Significant pain in both hands, predominantly in the 1st [facility] joints - Left hand more painful than right - Pain is persistent and impacts daily activities  Response to prior therapies - Cortisone injections previously provided relief but have become less effective over time - Uses Voltaren gel up to four times daily, especially at night, with beneficial effect - Uses Aspercreme for quicker relief  Chronic musculoskeletal pain and neuropathy - Currently taking Cymbalta (duloxetine) 60 mg for chronic musculoskeletal pain - Gabapentin  used for neuropathy in the feet  Assessment and Plan 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 Cortisone injections effective but may be losing efficacy. Discussed LDRT as a new treatment option. - Administer cortisone injections to bilateral hands under ultrasound guidance. - Discuss potential future use of LDRT. - Continue Voltaren gel up to four times daily. - Use Aspercreme as needed for pain relief. - Consider prednisone  pack for acute flare-ups.      Relevant Orders   US  LIMITED JOINT SPACE STRUCTURES UP BILAT     Orders & Medications Medications:  Meds ordered this encounter  Medications   DISCONTD: triamcinolone  acetonide (KENALOG -40) injection 80  mg   triamcinolone  acetonide (KENALOG -40) injection 40 mg   Orders  Placed This Encounter  Procedures   US  LIMITED JOINT SPACE STRUCTURES UP BILAT     No follow-ups on file.     Selinda JINNY Ku, MD, [facility]   Primary Care Sports Medicine Primary Care and Sports Medicine at [facility]

## 2024-03-04 NOTE — Anesthesia Preprocedure Evaluation (Addendum)
 Anesthesia Evaluation  Patient identified by MRN, date of birth, ID band Patient awake    Reviewed: Allergy & Precautions, H&P , NPO status , Patient's Chart, lab work & pertinent test results  Airway Mallampati: II  TM Distance: >3 FB Neck ROM: Full    Dental no notable dental hx. (+) Caps Caps upper central incisors :   Pulmonary neg pulmonary ROS   Pulmonary exam normal breath sounds clear to auscultation       Cardiovascular negative cardio ROS Normal cardiovascular exam Rhythm:Regular Rate:Normal     Neuro/Psych  PSYCHIATRIC DISORDERS Anxiety      Neuromuscular disease negative neurological ROS  negative psych ROS   GI/Hepatic negative GI ROS, Neg liver ROS,,,  Endo/Other  negative endocrine ROS    Renal/GU negative Renal ROS  negative genitourinary   Musculoskeletal negative musculoskeletal ROS (+) Arthritis ,    Abdominal   Peds negative pediatric ROS (+)  Hematology negative hematology ROS (+)   Anesthesia Other Findings Previous cataract 02-26-24 Dr. Ola  ITP (idiopathic thrombocytopenic purpura)Hypercholesteremia Neuropathy             Osteoporosis Anxiety             Arthritis Vertigo             Malignant neoplasm of upper-outer quadrant of left breast in female, estrogen receptor positive (HCC) Double vision       Reproductive/Obstetrics negative OB ROS                              Anesthesia Physical Anesthesia Plan  ASA: 3  Anesthesia Plan: MAC   Post-op Pain Management:    Induction: Intravenous  PONV Risk Score and Plan:   Airway Management Planned: Natural Airway and Nasal Cannula  Additional Equipment:   Intra-op Plan:   Post-operative Plan:   Informed Consent: I have reviewed the patients History and Physical, chart, labs and discussed the procedure including the risks, benefits and alternatives for the proposed anesthesia with the patient  or authorized representative who has indicated his/her understanding and acceptance.     Dental Advisory Given  Plan Discussed with: Anesthesiologist, CRNA and Surgeon  Anesthesia Plan Comments: (Patient consented for risks of anesthesia including but not limited to:  - adverse reactions to medications - damage to eyes, teeth, lips or other oral mucosa - nerve damage due to positioning  - sore throat or hoarseness - Damage to heart, brain, nerves, lungs, other parts of body or loss of life  Patient voiced understanding and assent.)         Anesthesia Quick Evaluation

## 2024-03-07 NOTE — Discharge Instructions (Signed)

## 2024-03-11 ENCOUNTER — Ambulatory Visit: Payer: Self-pay | Admitting: Anesthesiology

## 2024-03-11 ENCOUNTER — Other Ambulatory Visit: Payer: Self-pay

## 2024-03-11 ENCOUNTER — Encounter
Admission: RE | Disposition: A | Discharge status: Home / Self Care | Payer: Self-pay | Source: Home / Self Care | Attending: Ophthalmology

## 2024-03-11 ENCOUNTER — Ambulatory Visit
Admission: RE | Admit: 2024-03-11 | Discharge: 2024-03-11 | Disposition: A | Discharge status: Home / Self Care | Attending: Ophthalmology | Admitting: Ophthalmology

## 2024-03-11 ENCOUNTER — Encounter: Payer: Self-pay | Admitting: Ophthalmology

## 2024-03-11 DIAGNOSIS — Z9842 Cataract extraction status, left eye: Secondary | ICD-10-CM | POA: Diagnosis not present

## 2024-03-11 DIAGNOSIS — Z961 Presence of intraocular lens: Secondary | ICD-10-CM | POA: Insufficient documentation

## 2024-03-11 DIAGNOSIS — H2511 Age-related nuclear cataract, right eye: Secondary | ICD-10-CM | POA: Diagnosis present

## 2024-03-11 DIAGNOSIS — F419 Anxiety disorder, unspecified: Secondary | ICD-10-CM | POA: Diagnosis not present

## 2024-03-11 HISTORY — PX: CATARACT EXTRACTION W/PHACO: SHX586

## 2024-03-11 SURGERY — PHACOEMULSIFICATION, CATARACT, WITH IOL INSERTION
Anesthesia: Monitor Anesthesia Care | Site: Eye | Laterality: Right

## 2024-03-11 MED ORDER — CYCLOPENTOLATE HCL 2 % OP SOLN
OPHTHALMIC | Status: AC
  Filled 2024-03-11: qty 2

## 2024-03-11 MED ORDER — PHENYLEPHRINE HCL 10 % OP SOLN
1.0000 [drp] | OPHTHALMIC | Status: AC
  Administered 2024-03-11 (×3): 1 [drp] via OPHTHALMIC

## 2024-03-11 MED ORDER — TETRACAINE HCL 0.5 % OP SOLN
OPHTHALMIC | Status: AC
  Filled 2024-03-11: qty 4

## 2024-03-11 MED ORDER — EPINEPHRINE PF 1 MG/ML IJ SOLN
INTRAMUSCULAR | Status: DC | PRN
  Administered 2024-03-11: 94 mL via OPHTHALMIC

## 2024-03-11 MED ORDER — LIDOCAINE HCL (PF) 2 % IJ SOLN
INTRAOCULAR | Status: DC | PRN
  Administered 2024-03-11: 4 mL via INTRAOCULAR

## 2024-03-11 MED ORDER — PHENYLEPHRINE HCL 10 % OP SOLN
OPHTHALMIC | Status: AC
  Filled 2024-03-11: qty 5

## 2024-03-11 MED ORDER — FENTANYL CITRATE (PF) 100 MCG/2ML IJ SOLN
INTRAMUSCULAR | Status: AC
  Filled 2024-03-11: qty 2

## 2024-03-11 MED ORDER — MOXIFLOXACIN HCL 0.5 % OP SOLN
OPHTHALMIC | Status: DC | PRN
  Administered 2024-03-11: .2 mL via OPHTHALMIC

## 2024-03-11 MED ORDER — MIDAZOLAM HCL 5 MG/5ML IJ SOLN
INTRAMUSCULAR | Status: DC | PRN
  Administered 2024-03-11: 2 mg via INTRAVENOUS

## 2024-03-11 MED ORDER — LACTATED RINGERS IV SOLN: INTRAVENOUS | Status: DC

## 2024-03-11 MED ORDER — CYCLOPENTOLATE HCL 2 % OP SOLN
1.0000 [drp] | OPHTHALMIC | Status: AC
  Administered 2024-03-11 (×3): 1 [drp] via OPHTHALMIC

## 2024-03-11 MED ORDER — MIDAZOLAM HCL 2 MG/2ML IJ SOLN
INTRAMUSCULAR | Status: AC
  Filled 2024-03-11: qty 2

## 2024-03-11 MED ORDER — SIGHTPATH DOSE#1 BSS IO SOLN
INTRAOCULAR | Status: DC | PRN
  Administered 2024-03-11: 15 mL via INTRAOCULAR

## 2024-03-11 MED ORDER — TETRACAINE HCL 0.5 % OP SOLN
1.0000 [drp] | OPHTHALMIC | Status: AC
  Administered 2024-03-11 (×3): 1 [drp] via OPHTHALMIC

## 2024-03-11 MED ORDER — SIGHTPATH DOSE#1 NA HYALUR & NA CHOND-NA HYALUR IO KIT
PACK | INTRAOCULAR | Status: DC | PRN
  Administered 2024-03-11: 1 via OPHTHALMIC

## 2024-03-11 MED ORDER — FENTANYL CITRATE (PF) 100 MCG/2ML IJ SOLN
INTRAMUSCULAR | Status: DC | PRN
  Administered 2024-03-11: 100 ug via INTRAVENOUS

## 2024-03-11 SURGICAL SUPPLY — 9 items
DISSECTOR HYDRO NUCLEUS 50X22 (MISCELLANEOUS) ×1 IMPLANT
FEE CATARACT SUITE SIGHTPATH (MISCELLANEOUS) ×1 IMPLANT
GLOVE PI ULTRA LF STRL 7.5 (GLOVE) ×1 IMPLANT
GLOVE SURG SYN 6.5 PF PI BL (GLOVE) ×1 IMPLANT
GLOVE SURG SYN 8.5 PF PI BL (GLOVE) ×1 IMPLANT
LENS IOL TECNIS EYHANCE 15.0 (Intraocular Lens) IMPLANT
NDL FILTER BLUNT 18X1 1/2 (NEEDLE) ×1 IMPLANT
NEEDLE FILTER BLUNT 18X1 1/2 (NEEDLE) ×1 IMPLANT
SYR 3ML LL SCALE MARK (SYRINGE) ×1 IMPLANT

## 2024-03-11 NOTE — Op Note (Signed)
 OPERATIVE NOTE  Devera Englander [id number] 03/11/2024   PREOPERATIVE DIAGNOSIS:  Nuclear sclerotic cataract right eye.  H25.11   POSTOPERATIVE DIAGNOSIS:    Nuclear sclerotic cataract right eye.     PROCEDURE:  Phacoemusification with posterior chamber intraocular lens placement of the right eye   LENS:   Implant Name Type Inv. Item Serial No. Manufacturer Lot No. LRB No. Used Action  LENS IOL TECNIS EYHANCE 15.0 - [id number] Intraocular Lens LENS IOL TECNIS EYHANCE 15.0 [id number] SIGHTPATH  Right 1 Implanted       Procedure(s): PHACOEMULSIFICATION, CATARACT, WITH IOL INSERTION 6.50 00:45.3 (Right)  SURGEON:  Adine Novak, MD, MPH  ANESTHESIOLOGIST: Anesthesiologist: Ola Donny BROCKS, MD CRNA: Niki Manus SAUNDERS, CRNA   ANESTHESIA:  Topical with tetracaine drops augmented with 1% preservative-free intracameral lidocaine .  ESTIMATED BLOOD LOSS: less than 1 mL.   COMPLICATIONS:  None.   DESCRIPTION OF PROCEDURE:  The patient was identified in the holding room and transported to the operating room and placed in the supine position under the operating microscope.  The right eye was identified as the operative eye and it was prepped and draped in the usual sterile ophthalmic fashion.   A 1.0 millimeter clear-corneal paracentesis was made at the 10:30 position. 0.5 ml of preservative-free 1% lidocaine  with epinephrine was injected into the anterior chamber.  The anterior chamber was filled with viscoelastic.  A 2.4 millimeter keratome was used to make a near-clear corneal incision at the 8:00 position.  A curvilinear capsulorrhexis was made with a cystotome and capsulorrhexis forceps.  Balanced salt solution was used to hydrodissect and hydrodelineate the nucleus.   Phacoemulsification was then used in stop and chop fashion to remove the lens nucleus and epinucleus.  The remaining cortex was then removed using the irrigation and aspiration handpiece. Viscoelastic was then placed into the  capsular bag to distend it for lens placement.  A lens was then injected into the capsular bag.  The remaining viscoelastic was aspirated.   Wounds were hydrated with balanced salt solution.  The anterior chamber was inflated to a physiologic pressure with balanced salt solution.   Intracameral vigamox 0.1 mL undiluted was injected into the eye and a drop placed onto the ocular surface.  No wound leaks were noted.  The patient was taken to the recovery room in stable condition without complications of anesthesia or surgery  Adine Novak 03/11/2024, 9:11 AM

## 2024-03-11 NOTE — H&P (Signed)
 [facility]   Primary Care Physician:  Salli Amato, MD Ophthalmologist: Dr. Adine Novak  Pre-Procedure History & Physical: HPI:  Melissa Daniels is a 78 y.o. female here for cataract surgery.   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   Neuropathy    bilateral feet   Osteoporosis    Pre-diabetes    Vertigo    several episodes per year    Past Surgical History:  Procedure Laterality Date   BREAST BIOPSY Left 12/28/2016   stereo path pend   BREAST LUMPECTOMY     CATARACT EXTRACTION W/PHACO Left 02/26/2024   Procedure: PHACOEMULSIFICATION, CATARACT, WITH IOL INSERTION 7.81, 00:42.5;  Surgeon: Novak Adine Anes, MD;  Location: [facility];  Service: Ophthalmology;  Laterality: Left;   COLONOSCOPY WITH PROPOFOL  N/A 09/08/2017   Procedure: COLONOSCOPY WITH PROPOFOL ;  Surgeon: Jinny Carmine, MD;  Location: [facility];  Service: Endoscopy;  Laterality: N/A;   COLONOSCOPY WITH PROPOFOL  N/A 09/03/2020   Procedure: COLONOSCOPY WITH PROPOFOL ;  Surgeon: Jinny Carmine, MD;  Location: [facility];  Service: Endoscopy;  Laterality: N/A;  priority 4   EYE SURGERY     POLYPECTOMY  09/08/2017   Procedure: POLYPECTOMY;  Surgeon: Jinny Carmine, MD;  Location: [facility];  Service: Endoscopy;;   SPLENECTOMY, TOTAL      Prior to Admission medications   Medication Sig Start Date End Date Taking? Authorizing Provider  acetaminophen  (TYLENOL ) 325 MG tablet Take 650 mg by mouth at bedtime.   Yes [provider]  ALPRAZolam  (XANAX ) 0.25 MG tablet Take 1 tablet (0.25 mg total) by mouth 2 (two) times daily as needed for anxiety. 04/06/23  Yes Joshua Cathryne BROCKS, MD  Calcium -Magnesium-Zinc-Vit D3 (CALCIUM -MAGNESIUM-ZINC-D3) 333 MG-133 MG-5 MG-5  MCG TABS Take 1 tablet by mouth daily.   Yes [provider]  cyanocobalamin (VITAMIN B12) 500 MCG tablet Take 500 mcg by mouth daily.   Yes [provider]  DULoxetine (CYMBALTA) 60 MG capsule Take 60 mg by mouth daily. 06/25/21  Yes [provider]  gabapentin  (NEURONTIN ) 300 MG capsule Take 1 capsule (300 mg total) by mouth at bedtime. Patient taking differently: Take 600 mg by mouth in the morning and at bedtime. [facility] doctor- morning and night 06/28/19  Yes Jones, Deanna C, MD  Misc Natural Products (GLUCOSAMINE CHOND COMPLEX/MSM PO) Take 1 tablet by mouth daily.   Yes [provider]  Multiple Vitamins-Minerals (OCUVITE ADULT 50+ PO) Take 1 tablet by mouth in the morning and at bedtime.   Yes [provider]  Simethicone  (GAS RELIEF 125 MAX ST PO) Take 2 tablets by mouth 2 (two) times daily.   Yes [provider]  simvastatin  (ZOCOR ) 20 MG tablet Take 1 tablet (20 mg total) by mouth at bedtime. 06/19/23  Yes Jones, Deanna C, MD  solifenacin (VESICARE) 10 MG tablet Take 1 tablet by mouth daily. 09/20/23 09/19/24 Yes [provider]  vitamin E 400 UNIT capsule Take 400 Units by mouth daily.   Yes [provider]    Allergies as of 02/13/2024 - Review Complete 12/12/2023  Allergen Reaction Noted   Aspirin  11/28/2017   Atorvastatin   12/13/2022   Macrobid [nitrofurantoin]  06/28/2015   Nsaids Other (See Comments) 11/20/2017   Sulfa antibiotics  Other (See Comments) 06/28/2015    Family History  Problem Relation Age of Onset   Heart disease Mother    Colon cancer Mother    Hypertension Mother    Heart disease Father    Hypertension Father    Breast cancer Neg Hx     Social History   Socioeconomic History   Marital status: Widowed    Spouse name: Not on file   Number of children: 3   Years of education: 46   Highest education level: 12th grade  Occupational History   Occupation: Retired  Tobacco Use   Smoking  status: Never   Smokeless tobacco: Never  Vaping Use   Vaping status: Never Used  Substance and Sexual Activity   Alcohol use: Never   Drug use: Never   Sexual activity: Not Currently    Partners: Male  Other Topics Concern   Not on file  Social History Narrative   Pt lives alone   Social Drivers of Health   Financial Resource Strain: Low Risk  (12/11/2023)   Overall Financial Resource Strain (CARDIA)    Difficulty of Paying Living Expenses: Not hard at all  Food Insecurity: Unknown (12/11/2023)   Hunger Vital Sign    Worried About Running Out of Food in the Last Year: Never true    Ran Out of Food in the Last Year: Not on file  Transportation Needs: No Transportation Needs (12/11/2023)   PRAPARE - Administrator, Civil Service (Medical): No    Lack of Transportation (Non-Medical): No  Physical Activity: Insufficiently Active (12/11/2023)   Exercise Vital Sign    Days of Exercise per Week: 4 days    Minutes of Exercise per Session: 10 min  Stress: No Stress Concern Present (12/11/2023)   [employer] of Occupational Health - Occupational Stress Questionnaire    Feeling of Stress: Not at all  Social Connections: Moderately Integrated (12/11/2023)   Social Connection and Isolation Panel    Frequency of Communication with Friends and Family: More than three times a week    Frequency of Social Gatherings with Friends and Family: Not on file    Attends Religious Services: More than 4 times per year    Active Member of [employer] or Organizations: Yes    Attends Banker Meetings: Not on file    Marital Status: Widowed  Intimate Partner Violence: Not At Risk (10/12/2022)   Humiliation, Afraid, Rape, and Kick questionnaire    Fear of Current or Ex-Partner: No    Emotionally Abused: No    Physically Abused: No    Sexually Abused: No    Review of Systems: See HPI, otherwise negative ROS  Physical Exam: BP 114/76   Pulse 79   Temp 98.1 F (36.7 C)  (Temporal)   Resp 16   Wt 65.8 kg   SpO2 96%   BMI 23.40 kg/m  General:   Alert, cooperative. Head:  Normocephalic and atraumatic. Respiratory:  Normal work of breathing. Cardiovascular:  NAD  Impression/Plan: Melissa Daniels is here for cataract surgery.  Risks, benefits, limitations, and alternatives regarding cataract surgery have been reviewed with the patient.  Questions have been answered.  All parties agreeable.   Adine Novak, MD  03/11/2024, 8:43 AM

## 2024-03-11 NOTE — Anesthesia Postprocedure Evaluation (Signed)
 Anesthesia Post Note  Patient: Melissa Daniels  Procedure(s) Performed: PHACOEMULSIFICATION, CATARACT, WITH IOL INSERTION 6.50 00:45.3 (Right: Eye)  Patient location during evaluation: PACU Anesthesia Type: MAC Level of consciousness: awake and alert Pain management: pain level controlled Vital Signs Assessment: post-procedure vital signs reviewed and stable Respiratory status: spontaneous breathing, nonlabored ventilation, respiratory function stable and patient connected to nasal cannula oxygen Cardiovascular status: stable and blood pressure returned to baseline Postop Assessment: no apparent nausea or vomiting Anesthetic complications: no   No notable events documented.   Last Vitals:  Vitals:   03/11/24 0912 03/11/24 0918  BP: 118/65 133/76  Pulse: 93 79  Resp: 14 10  Temp: (!) 36.3 C (!) 36.3 C  SpO2: 95% 95%    Last Pain:  Vitals:   03/11/24 0918  TempSrc:   PainSc: 0-No pain                 Donny JAYSON Mu

## 2024-03-11 NOTE — Transfer of Care (Signed)
 Immediate Anesthesia Transfer of Care Note  Patient: Melissa Daniels  Procedure(s) Performed: PHACOEMULSIFICATION, CATARACT, WITH IOL INSERTION 6.50 00:45.3 (Right: Eye)  Patient Location: PACU  Anesthesia Type: MAC  Level of Consciousness: awake, alert  and patient cooperative  Airway and Oxygen Therapy: Patient Spontanous Breathing and Patient connected to supplemental oxygen  Post-op Assessment: Post-op Vital signs reviewed, Patient's Cardiovascular Status Stable, Respiratory Function Stable, Patent Airway and No signs of Nausea or vomiting  Post-op Vital Signs: Reviewed and stable  Complications: No notable events documented.

## 2024-04-15 ENCOUNTER — Ambulatory Visit: Payer: Self-pay

## 2024-04-15 ENCOUNTER — Ambulatory Visit
Admission: RE | Admit: 2024-04-15 | Discharge: 2024-04-15 | Disposition: A | Discharge status: Home / Self Care | Attending: Family Medicine | Admitting: Family Medicine

## 2024-04-15 ENCOUNTER — Other Ambulatory Visit: Payer: Self-pay

## 2024-04-15 ENCOUNTER — Ambulatory Visit
Admission: RE | Admit: 2024-04-15 | Discharge: 2024-04-15 | Disposition: A | Discharge status: Home / Self Care | Source: Ambulatory Visit | Attending: Family Medicine | Admitting: Family Medicine

## 2024-04-15 ENCOUNTER — Ambulatory Visit: Admitting: Family Medicine

## 2024-04-15 ENCOUNTER — Encounter: Payer: Self-pay | Admitting: Family Medicine

## 2024-04-15 VITALS — BP 108/80 | HR 91 | Ht 66.0 in | Wt 159.0 lb

## 2024-04-15 DIAGNOSIS — S8262XA Displaced fracture of lateral malleolus of left fibula, initial encounter for closed fracture: Secondary | ICD-10-CM | POA: Insufficient documentation

## 2024-04-15 DIAGNOSIS — M25572 Pain in left ankle and joints of left foot: Secondary | ICD-10-CM

## 2024-04-15 NOTE — Progress Notes (Signed)
 Primary Care / Sports Medicine Office Visit  Patient Information:  Patient ID: Melissa Daniels, female DOB: 02-03-1946 Age: 78 y.o. MRN: [id number]   Melissa Daniels is a pleasant 78 y.o. female presenting with the following:  Chief Complaint  Patient presents with   Ankle Injury    Left ankle swelling and bruising since patient rolled her ankle coming down her basement steps about 2-3 weeks ago. Sore when standing for long periods. She has been wearing an ace bandage PRN.     Vitals:   04/15/24 1540  BP: 108/80  Pulse: 91  SpO2: 99%   Vitals:   04/15/24 1540  Weight: 159 lb (72.1 kg)  Height: 5' 6 (1.676 m)   Body mass index is 25.66 kg/m.  No results found.   Discussed the use of AI scribe software for clinical note transcription with the patient, who gave verbal consent to proceed.   Independent interpretation of notes and tests performed by another provider:   RADIOLOGY Left Ankle X-ray: Small bone avulsion at the inferior aspect of the left fibula, no osseous involvement on the medial aspect, significant soft tissue shadow noted along the lateral more than medial ankle aspects. (04/15/2024)  Procedures performed:   None  Pertinent History, Exam, Impression, and Recommendations:   Problem List Items Addressed This Visit     Closed avulsion fracture of lateral malleolus of left fibula - Primary   History of Present Illness Melissa Daniels is a 78 year old female who presents with left ankle pain and swelling following a fall.  Left ankle pain and swelling - Onset 2-3 weeks ago following a fall onto concrete - Significant bruising initially, described as 'purple all the way up to my toenails' - Swelling has persisted since the incident - Pain is not severe and is primarily localized to the lateral aspect of the ankle - Able to bear weight, but experiences discomfort with certain movements - No consistent use of support or brace; occasionally uses a stretchy bandage -  No use of pain medication or topical treatments  Functional impairment - Difficulty navigating stairs, which impacts ability to access laundry and perform household tasks - Has not been volunteering for the past two months due to her condition  Supplement use - Takes calcium  and vitamin D supplements daily  Physical Exam LEFT ANKLE INSPECTION: Mild localized swelling over the distal fibula. No deformity, ecchymosis, or erythema beyond focal warmth. Normal alignment. PALPATION: Non-tender at the left fibular head and medial malleolus. Tenderness and warmth noted over the inferior aspect of the left distal fibula. No tenderness along the deltoid ligament or Achilles tendon. RANGE OF MOTION: Full dorsiflexion and plantarflexion with mild discomfort near end-range inversion. STRENGTH: 5/5 strength in dorsiflexion, plantarflexion, inversion, and eversion. NEUROLOGICAL: Sensation intact to light touch throughout the left foot and ankle. No motor deficits. SPECIAL TESTS: Negative squeeze and talar tilt tests. Symmetric anterior drawer testing bilaterally without instability.  Assessment and Plan Left distal fibular chip fracture with associated left ankle swelling and pain Small chip fracture at the left distal fibula with lateral ankle swelling. X-ray shows no significant medial bone involvement. Fracture akin to severe ankle sprain, expected to heal conservatively. Risk of post-healing stiffness and weakness. Offered CAM boot, she declined citing not leaving house regularly. - Use ASO ankle brace 1-2 weeks for support. - Provided AAOS foot and ankle conditioning program for post-Thanksgiving exercises. - Ice and elevate ankle to reduce swelling. - Use topical rubs for pain  control as needed. - Monitor progress and report if symptoms worsen.        Orders & Medications Medications: No orders of the defined types were placed in this encounter.  No orders of the defined types were placed in  this encounter.    No follow-ups on file.     Selinda JINNY Ku, MD, [facility]   Primary Care Sports Medicine Primary Care and Sports Medicine at [facility]

## 2024-04-15 NOTE — Assessment & Plan Note (Addendum)
 History of Present Illness Melissa Daniels is a 78 year old female who presents with left ankle pain and swelling following a fall.  Left ankle pain and swelling - Onset 2-3 weeks ago following a fall onto concrete - Significant bruising initially, described as 'purple all the way up to my toenails' - Swelling has persisted since the incident - Pain is not severe and is primarily localized to the lateral aspect of the ankle - Able to bear weight, but experiences discomfort with certain movements - No consistent use of support or brace; occasionally uses a stretchy bandage - No use of pain medication or topical treatments  Functional impairment - Difficulty navigating stairs, which impacts ability to access laundry and perform household tasks - Has not been volunteering for the past two months due to her condition  Supplement use - Takes calcium  and vitamin D supplements daily  Physical Exam LEFT ANKLE INSPECTION: Mild localized swelling over the distal fibula. No deformity, ecchymosis, or erythema beyond focal warmth. Normal alignment. PALPATION: Non-tender at the left fibular head and medial malleolus. Tenderness and warmth noted over the inferior aspect of the left distal fibula. No tenderness along the deltoid ligament or Achilles tendon. RANGE OF MOTION: Full dorsiflexion and plantarflexion with mild discomfort near end-range inversion. STRENGTH: 5/5 strength in dorsiflexion, plantarflexion, inversion, and eversion. NEUROLOGICAL: Sensation intact to light touch throughout the left foot and ankle. No motor deficits. SPECIAL TESTS: Negative squeeze and talar tilt tests. Symmetric anterior drawer testing bilaterally without instability.  Assessment and Plan Left distal fibular chip fracture with associated left ankle swelling and pain Small chip fracture at the left distal fibula with lateral ankle swelling. X-ray shows no significant medial bone involvement. Fracture akin to severe ankle  sprain, expected to heal conservatively. Risk of post-healing stiffness and weakness. Offered CAM boot, she declined citing not leaving house regularly. - Use ASO ankle brace 1-2 weeks for support. - Provided AAOS foot and ankle conditioning program for post-Thanksgiving exercises. - Ice and elevate ankle to reduce swelling. - Use topical rubs for pain control as needed. - Monitor progress and report if symptoms worsen.

## 2024-04-15 NOTE — Telephone Encounter (Signed)
   Wore an ace bandage for a week Iced it off and on Patient states swelling   FYI Only or Action Required?: FYI only for provider: appointment scheduled on 04/15/2024 at 3pm with Dr Selinda Ku.  Patient was last seen in primary care on 02/27/2024 by Ku Selinda PARAS, MD.  Called Nurse Triage reporting Ankle Injury.  Symptoms began several weeks ago.  Interventions attempted: Rest, hydration, or home remedies and Ice/heat application.  Symptoms are: unchanged.  Triage Disposition: See PCP When Office is Open (Within 3 Days)  Patient/caregiver understands and will follow disposition?: Yes                   Copied from CRM #[id number]. Topic: Clinical - Red Word Triage >> Apr 15, 2024  1:05 PM Antony RAMAN wrote: [facility] that prompted transfer to Nurse Triage: fell 2 weeks ago, swelling wont go down Reason for Disposition  [1] After 2 weeks AND [2] still painful or swollen  Answer Assessment - Initial Assessment Questions Wore an ace bandage for a week Iced it off and on Patient states swelling still present and wants to get it checked out   1. MECHANISM: How did the injury happen? (e.g., twisting injury, direct blow)      Patient states she miss-stepped at the bottom of her stairs and states she just rolled this left ankle 2. ONSET: When did the injury happen? (e.g., minutes or hours ago)      2-3 weeks ago 3. LOCATION: Where is the injury located?      Left ankle 4. APPEARANCE of INJURY: What does the injury look like?      Still swollen and bruised some per patient 7. PAIN: Is there pain? If Yes, ask: How bad is the pain?  What does it keep you from doing? (Scale 0-10; or none, mild, moderate, severe)     Patient states not as bad as it was for a few days  Protocols used: Ankle Injury-A-AH

## 2024-04-15 NOTE — Telephone Encounter (Signed)
 Noted  Pt has appt.  KP

## 2024-04-15 NOTE — Patient Instructions (Addendum)
 VISIT SUMMARY:  You visited us  today due to pain and swelling in your left ankle following a fall a few weeks ago. An X-ray revealed a small chip fracture in your left distal fibula, which is expected to heal with conservative treatment.  YOUR PLAN:  LEFT DISTAL FIBULAR CHIP FRACTURE WITH ASSOCIATED LEFT ANKLE SWELLING AND PAIN: You have a small chip fracture in your left distal fibula, similar to a severe ankle sprain, causing swelling and pain. -Wear an ASO ankle brace for additional support x 1-2 weeks -Follow the AAOS foot and ankle conditioning program for exercises after Thanksgiving. -Ice and elevate your ankle to help reduce swelling. -Use topical rubs as needed for pain control. -Monitor your progress and report if your symptoms worsen.

## 2024-04-17 ENCOUNTER — Ambulatory Visit: Payer: Self-pay | Admitting: Family Medicine

## 2024-05-07 ENCOUNTER — Encounter: Payer: Self-pay | Admitting: Internal Medicine

## 2024-05-07 ENCOUNTER — Ambulatory Visit: Admitting: Family Medicine

## 2024-05-07 ENCOUNTER — Encounter: Payer: Self-pay | Admitting: Family Medicine

## 2024-05-07 ENCOUNTER — Ambulatory Visit: Payer: Self-pay

## 2024-05-07 VITALS — BP 118/66 | HR 90 | Ht 66.0 in

## 2024-05-07 DIAGNOSIS — S8262XD Displaced fracture of lateral malleolus of left fibula, subsequent encounter for closed fracture with routine healing: Secondary | ICD-10-CM

## 2024-05-07 NOTE — Progress Notes (Signed)
 Primary Care / Sports Medicine Office Visit  Patient Information:  Patient ID: Melissa Daniels, female DOB: 09-30-1945 Age: 78 y.o. MRN: [id number]   Melissa Daniels is a pleasant 78 y.o. female presenting with the following:  Chief Complaint  Patient presents with   Joint Swelling    Left ankle swelling continues since last visit on 04/15/24. Patient did have a fx of her fibula. She is wanting to know if her ankle and leg should still be swelling.     Vitals:   05/07/24 1500  BP: 118/66  Pulse: 90  SpO2: 98%   Vitals:   05/07/24 1500  Height: 5' 6 (1.676 m)   Body mass index is 25.66 kg/m.  DG Ankle Complete Left Result Date: 04/16/2024 CLINICAL DATA:  Persistent swelling after injury 2 weeks ago. Acute left ankle pain. EXAM: DG ANKLE COMPLETE 3+V*L* COMPARISON:  None Available. FINDINGS: Cortical irregularity about the distal fibular tip is suspicious for minimally displaced avulsion fracture. No additional fracture of the ankle. The ankle mortise is preserved. Minimal joint effusion. Soft tissue edema laterally. IMPRESSION: Cortical irregularity about the distal fibular tip is suspicious for minimally displaced avulsion fracture. Electronically Signed   By: Andrea Gasman M.D.   On: 04/16/2024 17:22     Discussed the use of AI scribe software for clinical note transcription with the patient, who gave verbal consent to proceed.   Independent interpretation of notes and tests performed by another provider:   None  Procedures performed:   None  Pertinent History, Exam, Impression, and Recommendations:   Problem List Items Addressed This Visit     Closed avulsion fracture of lateral malleolus of left fibula - Primary   History of Present Illness Melissa Daniels is a 78 year old female who presents with persistent swelling above the ankle despite wearing a boot.  Ankle swelling - Persistent swelling above the ankle since a fall onto concrete approximately 2-3 weeks prior to  November 17th - Swelling measures approximately half an inch larger on one side and three-quarters of an inch larger on the other - No associated pain with the swelling, either while wearing the boot or when not wearing it - Continues to use a brace beyond the initially recommended period in an attempt to reduce swelling - Alternates between wearing a boot and support hose  History of fall and bruising - Sustained a fall onto concrete approximately 2-3 weeks prior to November 17th - Resulted in bruising at the time of injury  Gait instability and visual disturbance - 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 Tenderness minimally noted at inferior lateral malleolar tip, no crepitus, nontender gastrocnemius. SPECIAL TESTS: Homan's test negative.  Assessment and Plan Left ankle lateral malleolus avulsion with residual swelling Improvements clinically with patient concerns over swelling. Had continued ASO brace beyond 1-2 weeks from last visit. Reinforced next steps and goal of treatment (home-based rehab), wean / discontinue ASO brace, and swelling duration. We also reviewed red flags for DVT. - Discontinued brace use. - Initiated ankle strengthening exercises per AAOS guidelines. - Monitor for worsening pain or function. -  Contact provider if swelling worsens or pain increases.         Orders & Medications Medications: No orders of the defined types were placed in this encounter.  No orders of the defined types were placed in this encounter.    No follow-ups on file.     Selinda JINNY Ku,  MD, [facility]   Primary Care Sports Medicine Primary Care and Sports Medicine at [facility]

## 2024-05-07 NOTE — Telephone Encounter (Signed)
 FYI Only or Action Required?: Action required by provider: clinical question for provider and update on patient condition. Refused offered follow up visit, requesting call back with Dr. Alvia recommendation on if follow up appointment is needed.   Patient was last seen in primary care on 04/15/2024 by Melissa Selinda PARAS, Melissa Daniels.  Called Nurse Triage reporting Ankle Injury.  Symptoms began several weeks ago.  Interventions attempted: Rest, hydration, or home remedies and Other: elevating.  Symptoms are: unchanged.  Triage Disposition: See PCP When Office is Open (Within 3 Days)  Patient/caregiver understands and will follow disposition?: No, wishes to speak with PCP   Copied from CRM #[id number]. Topic: Clinical - Red Word Triage >> May 07, 2024 10:09 AM [name] wrote: Red Word that prompted transfer to Nurse Triage: Patient had a chip her ankle and  Swelling  Wearing boot since October   **Transf. To NT** Reason for Disposition  MODERATE ankle swelling (e.g., interferes with normal activities, can't move joint normally) (Exceptions: Itchy, localized swelling; swelling is chronic.)  Answer Assessment - Initial Assessment Questions Additional info: Patient was evaluated at [facility] Dr. Alvia on 04/15/24  for ankle injury. She has been wearing a walking boot since that visit. She is calling today to ask if she needs to be concerned with left ankle swelling that is increasing above her ankle under the boot. She denies redness, pain and all other symptoms. This clinical research associate let patient know it is reassuring she is not having other symptoms, offered follow up appointment with Dr. Alvia but she declines offer at this time and would like Dr. Alvia opinion on if she needs a reevaluation or continue with home care.  Please follow up call with patient.    1. LOCATION: Which ankle is swollen? Where is the swelling?     Left ankle and slightly above ankle under boot. Boot does not feel tight, she  is able to adjust it if needed.  2. ONSET: When did the swelling start?     Ongoing since injury in early November 3. SWELLING: How bad is the swelling? Or, How large is it? (e.g., mild, moderate, severe; size of localized swelling)      Ankle 1/2 inch > right, above ankle 3/4 inch bigger than right  4. PAIN: Is there any pain? If Yes, ask: How bad is it? (Scale 0-10; or none, mild, moderate, severe)     Denies all pain 5. CAUSE: What do you think caused the ankle swelling?     Chip in bone 6. OTHER SYMPTOMS: Do you have any other symptoms? (e.g., fever, chest pain, difficulty breathing, calf pain)     Denies redness, denies all pain including chest pain, difficulty breathing.  Protocols used: Ankle Swelling-A-AH

## 2024-05-07 NOTE — Patient Instructions (Signed)
 VISIT SUMMARY:  During your visit, we discussed the persistent swelling in your ankle following a fall, as well as your recurrent thumb pain. We reviewed your current treatments and made some adjustments to help manage your symptoms.  YOUR PLAN:  LEFT ANKLE SPRAIN WITH RESIDUAL SWELLING: You have a chronic left ankle sprain with swelling that is expected to persist due to blood flow issues. There is no pain or signs of a blood clot. -Stop using the brace. -Start ankle strengthening exercises as per AAOS guidelines. -Monitor for any worsening pain or function. -Contact us  if the swelling worsens or if you experience increased pain.

## 2024-05-07 NOTE — Telephone Encounter (Signed)
 Please review and a call patient

## 2024-05-07 NOTE — Assessment & Plan Note (Signed)
 History of Present Illness Melissa Daniels is a 78 year old female who presents with persistent swelling above the ankle despite wearing a boot.  Ankle swelling - Persistent swelling above the ankle since a fall onto concrete approximately 2-3 weeks prior to November 17th - Swelling measures approximately half an inch larger on one side and three-quarters of an inch larger on the other - No associated pain with the swelling, either while wearing the boot or when not wearing it - Continues to use a brace beyond the initially recommended period in an attempt to reduce swelling - Alternates between wearing a boot and support hose  History of fall and bruising - Sustained a fall onto concrete approximately 2-3 weeks prior to November 17th - Resulted in bruising at the time of injury  Gait instability and visual disturbance - 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 Tenderness minimally noted at inferior lateral malleolar tip, no crepitus, nontender gastrocnemius. SPECIAL TESTS: Homan's test negative.  Assessment and Plan Left ankle lateral malleolus avulsion with residual swelling Improvements clinically with patient concerns over swelling. Had continued ASO brace beyond 1-2 weeks from last visit. Reinforced next steps and goal of treatment (home-based rehab), wean / discontinue ASO brace, and swelling duration. We also reviewed red flags for DVT. - Discontinued brace use. - Initiated ankle strengthening exercises per AAOS guidelines. - Monitor for worsening pain or function. - Contact provider if swelling  worsens or pain increases.

## 2024-05-14 ENCOUNTER — Ambulatory Visit
Admission: EM | Admit: 2024-05-14 | Discharge: 2024-05-14 | Disposition: A | Discharge status: Home / Self Care | Source: Home / Self Care | Attending: Emergency Medicine | Admitting: Emergency Medicine

## 2024-05-14 DIAGNOSIS — J029 Acute pharyngitis, unspecified: Secondary | ICD-10-CM | POA: Insufficient documentation

## 2024-05-14 DIAGNOSIS — B974 Respiratory syncytial virus as the cause of diseases classified elsewhere: Secondary | ICD-10-CM | POA: Insufficient documentation

## 2024-05-14 DIAGNOSIS — J028 Acute pharyngitis due to other specified organisms: Secondary | ICD-10-CM | POA: Insufficient documentation

## 2024-05-14 LAB — POCT RAPID STREP A (OFFICE): Rapid Strep A Screen: NEGATIVE

## 2024-05-14 MED ORDER — IPRATROPIUM BROMIDE 0.06 % NA SOLN: 2.0000 | Freq: Four times a day (QID) | NASAL | 0 refills | Status: AC

## 2024-05-14 MED ORDER — BENZONATATE 200 MG PO CAPS: 200.0000 mg | ORAL_CAPSULE | Freq: Three times a day (TID) | ORAL | 0 refills | Status: DC | PRN

## 2024-05-14 NOTE — Discharge Instructions (Signed)
 Your rapid strep was negative today.  We have sent it off for culture to confirm absence of strep throat.  If it is positive, we will contact you and call in the appropriate antibiotics.  1 gram of Tylenol   3-4 times a day as needed for pain.  Make sure you drink plenty of extra fluids.  Some people find salt water  gargles and  Traditional Medicinal's Throat Coat tea helpful. Take 5 mL of liquid Benadryl and 5 mL of Maalox/Mylanta. Mix it together, and then hold it in your mouth for as long as you can and then swallow. You may do this 4 times a day.  Honey and lemon dissolved in hot water  can also be soothing.  Saline nasal irrigation with a NeilMed rinse and distilled water  as often as you want, Tessalon  for cough, Atrovent  nasal spray for nasal congestion and postnasal drip.  Go to www.goodrx.com  or www.costplusdrugs.com to look up your medications. This will give you a list of where you can find your prescriptions at the most affordable prices. Or ask the pharmacist what the cash price is, or if they have any other discount programs available to help make your medication more affordable. This can be less expensive than what you would pay with insurance.

## 2024-05-14 NOTE — ED Provider Notes (Signed)
 HPI  SUBJECTIVE:  Melissa Daniels is a 78 y.o. female who presents with dry coughing that started yesterday that is getting worse, with headache, nasal congestion, occasional yellow rhinorrhea and sore throat starting today.  Daughter notes mild dyspnea on exertion today.  No fevers, body aches, postnasal drip, wheezing, shortness of breath.  She saw her PCP yesterday and was found to be RSV positive.  She was thought to perhaps have a UTI, but was not started on antibiotics.  A urine culture was sent.  Patient has tried cough drops, Tylenol  and increasing fluids with improvement in her symptoms.  No aggravating factors.  She has a past medical history of ITP, breast cancer, hypercholesterolemia, status post splenectomy.  No history of pulmonary disease, hypertension, chronic kidney disease, diabetes.    Outside chart review shows that she was seen by her PCP yesterday at [facility] For back pain cough, found to have RSV and UTI.  Influenza COVID PCR testing negative. Additional history obtained from daughter.  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   Neuropathy    bilateral feet   Osteoporosis    Pre-diabetes    Vertigo    several episodes per year    Past Surgical History:  Procedure Laterality Date   BREAST BIOPSY Left 12/28/2016   stereo path pend   BREAST LUMPECTOMY     CATARACT EXTRACTION W/PHACO Left 02/26/2024   Procedure: PHACOEMULSIFICATION, CATARACT, WITH IOL INSERTION 7.81, 00:42.5;  Surgeon: Myrna Adine Anes, MD;  Location: [facility];  Service: Ophthalmology;  Laterality: Left;   CATARACT EXTRACTION W/PHACO Right 03/11/2024   Procedure: PHACOEMULSIFICATION, CATARACT, WITH IOL INSERTION 6.50 00:45.3;  Surgeon: Myrna Adine Anes, MD;   Location: [facility];  Service: Ophthalmology;  Laterality: Right;   COLONOSCOPY WITH PROPOFOL  N/A 09/08/2017   Procedure: COLONOSCOPY WITH PROPOFOL ;  Surgeon: Jinny Carmine, MD;  Location: [facility];  Service: Endoscopy;  Laterality: N/A;   COLONOSCOPY WITH PROPOFOL  N/A 09/03/2020   Procedure: COLONOSCOPY WITH PROPOFOL ;  Surgeon: Jinny Carmine, MD;  Location: [facility];  Service: Endoscopy;  Laterality: N/A;  priority 4   EYE SURGERY     POLYPECTOMY  09/08/2017   Procedure: POLYPECTOMY;  Surgeon: Jinny Carmine, MD;  Location: [facility];  Service: Endoscopy;;   SPLENECTOMY, TOTAL      Family History  Problem Relation Age of Onset   Heart disease Mother    Colon cancer Mother    Hypertension Mother    Heart disease Father    Hypertension Father    Breast cancer Neg Hx     Social History[1]  Current Medications[2]  Allergies[3]   ROS  As noted in HPI.   Physical Exam  BP (!) 151/83 (BP Location: Left Arm)   Pulse 87   Temp 98.2 F (36.8 C) (Oral)   Resp 17   Wt 71.2 kg   SpO2 100%   BMI 25.34 kg/m   Constitutional: Well developed, well nourished, no acute distress Eyes: PERRL, EOMI, conjunctiva normal bilaterally HENT: Normocephalic, atraumatic,mucus membranes moist.  Extensive clear nasal congestion.  Erythematous, swollen turbinates.  No maxillary, frontal sinus tenderness.  Intensely erythematous oropharynx.  Tonsils normal size without exudates.  Extensive postnasal drip.  Uvula midline. Neck: No cervical lymphadenopathy  Respiratory: Clear to auscultation bilaterally, no rales, no wheezing, no rhonchi Cardiovascular: Normal rate and rhythm, no murmurs, no gallops, no rubs GI: nondistended skin: No rash, skin intact Musculoskeletal: no deformities Neurologic: Alert & oriented x 3, CN III-XII grossly intact, no motor deficits, sensation grossly intact Psychiatric: Speech and behavior appropriate   ED Course   Medications  - No data to display  Orders Placed This Encounter  Procedures   Culture, group A strep (throat)    Standing Status:   Standing    Number of Occurrences:   1   POC rapid strep A    Standing Status:   Standing    Number of Occurrences:   1   Results for orders placed or performed during the hospital encounter of 05/14/24 (from the past 24 hours)  POC rapid strep A     Status: Normal   Collection Time: 05/14/24  5:34 PM  Result Value Ref Range   Rapid Strep A Screen Negative Negative   No results found.  ED Clinical Impression  1. Acute pharyngitis due to respiratory syncytial virus (RSV)   2. Sore throat      ED Assessment/Plan     Patient RSV positive.  She was already aware of this.  I suspect that her symptoms are coming from this.  Rapid strep negative.  Discussed this with patient and family member.  However, will send throat culture off to confirm absence of strep throat.  Will send home with Benadryl/Maalox mixture, Atrovent  nasal spray, saline nasal irrigation, Tylenol , Tessalon .  Follow-up with PCP as needed.  Discussed labs, MDM, treatment plan, and plan for follow-up with patient and family member.  ER return precautions given.  They agree with plan.   Meds ordered this encounter  Medications   ipratropium (ATROVENT ) 0.06 % nasal spray    Sig: Place 2 sprays into both nostrils 4 (four) times daily.    Dispense:  15 mL    Refill:  0   benzonatate  (TESSALON ) 200 MG capsule    Sig: Take 1 capsule (200 mg total) by mouth 3 (three) times daily as needed for cough.    Dispense:  30 capsule    Refill:  0      *This clinic note was created using Scientist, clinical (histocompatibility and immunogenetics). Therefore, there may be occasional mistakes despite careful proofreading. ?      [1]  Social History Tobacco Use   Smoking status: Never   Smokeless tobacco: Never  Vaping Use   Vaping status: Never Used  Substance Use Topics   Alcohol use: Never   Drug use: Never  [2] No current  facility-administered medications for this encounter.  Current Outpatient Medications:    benzonatate  (TESSALON ) 200 MG capsule, Take 1 capsule (200 mg total) by mouth 3 (three) times daily as needed for cough., Disp: 30 capsule, Rfl: 0   DULoxetine (CYMBALTA) 60 MG capsule, Take 60 mg by mouth daily., Disp: , Rfl:    ipratropium (ATROVENT ) 0.06 % nasal spray, Place 2 sprays into both nostrils 4 (four) times daily., Disp: 15 mL, Rfl: 0   simvastatin  (ZOCOR ) 20 MG tablet, Take 1 tablet (20 mg total) by mouth at bedtime., Disp: 30 tablet, Rfl: 0   solifenacin (VESICARE) 10 MG tablet, Take 1 tablet by mouth daily., Disp: , Rfl:    acetaminophen  (TYLENOL ) 325 MG tablet, Take 650 mg by mouth at bedtime., Disp: , Rfl:    ALPRAZolam  (XANAX ) 0.25 MG tablet, Take 1 tablet (0.25 mg  total) by mouth 2 (two) times daily as needed for anxiety., Disp: 30 tablet, Rfl: 0   Calcium -Magnesium-Zinc-Vit D3 (CALCIUM -MAGNESIUM-ZINC-D3) 333 MG-133 MG-5 MG-5 MCG TABS, Take 1 tablet by mouth daily., Disp: , Rfl:    cyanocobalamin (VITAMIN B12) 500 MCG tablet, Take 500 mcg by mouth daily., Disp: , Rfl:    gabapentin  (NEURONTIN ) 300 MG capsule, Take 1 capsule (300 mg total) by mouth at bedtime. (Patient taking differently: Take 600 mg by mouth in the morning and at bedtime. [facility] doctor- morning and night), Disp: 90 capsule, Rfl: 1   Misc Natural Products (GLUCOSAMINE CHOND COMPLEX/MSM PO), Take 1 tablet by mouth daily., Disp: , Rfl:    Multiple Vitamins-Minerals (OCUVITE ADULT 50+ PO), Take 1 tablet by mouth in the morning and at bedtime., Disp: , Rfl:    Simethicone  (GAS RELIEF 125 MAX ST PO), Take 2 tablets by mouth 2 (two) times daily., Disp: , Rfl:    vitamin E 400 UNIT capsule, Take 400 Units by mouth daily., Disp: , Rfl:  [3]  Allergies Allergen Reactions   Aspirin    Atorvastatin     Macrobid [Nitrofurantoin]    Nsaids Other (See Comments)    History of ITP, 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 History of  ITP, 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    Sulfa Antibiotics Other (See Comments)    Platelets drop      Van Knee, MD 05/15/24 1105

## 2024-05-14 NOTE — ED Triage Notes (Signed)
 Sx started today  Cough Nasal congestion Sore throat Headache

## 2024-05-16 ENCOUNTER — Ambulatory Visit

## 2024-05-16 ENCOUNTER — Ambulatory Visit
Admission: EM | Admit: 2024-05-16 | Discharge: 2024-05-16 | Disposition: A | Discharge status: Home / Self Care | Source: Home / Self Care

## 2024-05-16 DIAGNOSIS — W19XXXA Unspecified fall, initial encounter: Secondary | ICD-10-CM

## 2024-05-16 DIAGNOSIS — R7303 Prediabetes: Secondary | ICD-10-CM | POA: Diagnosis not present

## 2024-05-16 DIAGNOSIS — S81812A Laceration without foreign body, left lower leg, initial encounter: Secondary | ICD-10-CM | POA: Diagnosis not present

## 2024-05-16 DIAGNOSIS — G629 Polyneuropathy, unspecified: Secondary | ICD-10-CM | POA: Insufficient documentation

## 2024-05-16 DIAGNOSIS — Y929 Unspecified place or not applicable: Secondary | ICD-10-CM | POA: Insufficient documentation

## 2024-05-16 DIAGNOSIS — S8011XA Contusion of right lower leg, initial encounter: Secondary | ICD-10-CM | POA: Insufficient documentation

## 2024-05-16 DIAGNOSIS — S81012A Laceration without foreign body, left knee, initial encounter: Secondary | ICD-10-CM | POA: Diagnosis not present

## 2024-05-16 DIAGNOSIS — S0990XA Unspecified injury of head, initial encounter: Secondary | ICD-10-CM | POA: Diagnosis not present

## 2024-05-16 DIAGNOSIS — Y939 Activity, unspecified: Secondary | ICD-10-CM | POA: Insufficient documentation

## 2024-05-16 DIAGNOSIS — R55 Syncope and collapse: Secondary | ICD-10-CM | POA: Diagnosis not present

## 2024-05-16 DIAGNOSIS — R296 Repeated falls: Secondary | ICD-10-CM | POA: Diagnosis not present

## 2024-05-16 DIAGNOSIS — W1830XA Fall on same level, unspecified, initial encounter: Secondary | ICD-10-CM | POA: Diagnosis not present

## 2024-05-16 NOTE — ED Triage Notes (Signed)
 Pt is with her Daughter  Pt c/o fall at 10:15am  Pt states that she fell asleep while using the computer and fell out of there chair.  Pt has a skin tear along the left knee and an abrasion along the left eyebrow.   Pt states that she fell on 12.17.25 and has bruising along the right leg.   Pt is currently positive for RSV

## 2024-05-16 NOTE — ED Provider Notes (Signed)
 [facility]    CSN: [id number] Arrival date & time: 05/16/24  1055      History   Chief Complaint Chief Complaint  Patient presents with   Fall    HPI Caylen Kuwahara is a 78 y.o. female.   HPI  78 year old female with past medical history significant for ITP, high cholesterol, neuropathy, vertigo, osteoporosis, prediabetes, and breast cancer presents for evaluation of closed injury status post fall.  She denies any headache, change in vision, nausea, or vomiting.  She reports that she fell out of an office chair onto a laminate floor.  Last tetanus shot was administered in February 2025 upon review of records in epic.  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   Neuropathy    bilateral feet   Osteoporosis    Pre-diabetes    Vertigo    several episodes per year    Patient Active Problem List   Diagnosis Date Noted   Closed avulsion fracture of lateral malleolus of left fibula 04/15/2024   Pain of both hip joints 12/12/2023   Arthralgia of left knee 05/01/2023   Localized primary osteoarthritis of carpometacarpal (CMC) joint 05/01/2023   Primary osteoarthritis of right knee 02/22/2021   History of strabismus surgery 01/18/2021   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   Dry eye syndrome of both eyes 08/06/2020   Binocular vision disorder with diplopia 08/06/2020   Sixth (abducent) nerve palsy, 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   Esotropia 03/26/2018   Abscess of finger of right hand    Abscess of left forearm    Cellulitis 10/21/2017   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   History of colonic polyps    Benign neoplasm of cecum    Benign neoplasm of transverse colon    Benign neoplasm of ascending colon    Malignant neoplasm of upper-outer quadrant of left breast in female,  estrogen receptor positive (HCC) 01/10/2017   Neuropathy 10/31/2016   Taking medication for chronic disease 10/31/2016   Primary osteoarthritis of right hip 10/31/2016   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HCC) 10/19/2015   Menopause 10/19/2015   Hormone replacement therapy (HRT) 10/19/2015   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   Routine general medical examination at a health care facility 10/10/2014   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   Female stress incontinence 10/10/2014   Colon polyp 10/10/2014   Episodic paroxysmal anxiety disorder 10/10/2014    Past Surgical History:  Procedure Laterality Date   BREAST BIOPSY Left 12/28/2016   stereo path pend   BREAST LUMPECTOMY     CATARACT EXTRACTION W/PHACO Left 02/26/2024   Procedure: PHACOEMULSIFICATION, CATARACT, WITH IOL INSERTION 7.81, 00:42.5;  Surgeon: Myrna Adine Anes, MD;  Location: [facility];  Service: Ophthalmology;  Laterality: Left;   CATARACT EXTRACTION W/PHACO Right 03/11/2024   Procedure: PHACOEMULSIFICATION, CATARACT, WITH IOL INSERTION 6.50 00:45.3;  Surgeon: Myrna Adine Anes, MD;  Location: [facility];  Service: Ophthalmology;  Laterality: Right;   COLONOSCOPY WITH PROPOFOL  N/A 09/08/2017   Procedure: COLONOSCOPY WITH PROPOFOL ;  Surgeon: Jinny Carmine, MD;  Location: [facility];  Service: Endoscopy;  Laterality: N/A;   COLONOSCOPY WITH PROPOFOL  N/A 09/03/2020   Procedure: COLONOSCOPY WITH PROPOFOL ;  Surgeon: Jinny Carmine, MD;  Location: [facility];  Service: Endoscopy;  Laterality: N/A;  priority 4   EYE SURGERY  POLYPECTOMY  09/08/2017   Procedure: POLYPECTOMY;  Surgeon: Jinny Carmine, MD;  Location: [facility];  Service: Endoscopy;;   SPLENECTOMY, TOTAL      OB History     Gravida  3   Para  3   Term  3   Preterm      AB      Living  3      SAB      IAB      Ectopic      Multiple      Live Births               Home  Medications    Prior to Admission medications  Medication Sig Start Date End Date Taking? Authorizing Provider  acetaminophen  (TYLENOL ) 325 MG tablet Take 650 mg by mouth at bedtime.   Yes [provider]  ALPRAZolam  (XANAX ) 0.25 MG tablet Take 1 tablet (0.25 mg total) by mouth 2 (two) times daily as needed for anxiety. 04/06/23  Yes Joshua Cathryne BROCKS, MD  Calcium -Magnesium-Zinc-Vit D3 (CALCIUM -MAGNESIUM-ZINC-D3) 333 MG-133 MG-5 MG-5 MCG TABS Take 1 tablet by mouth daily.   Yes [provider]  cephALEXin  (KEFLEX ) 500 MG capsule Take 1 capsule (500 mg total) by mouth 3 (three) times daily for 7 days. 05/16/24 05/23/24 Yes Bernardino Ditch, NP  cyanocobalamin (VITAMIN B12) 500 MCG tablet Take 500 mcg by mouth daily.   Yes [provider]  DULoxetine (CYMBALTA) 60 MG capsule Take 60 mg by mouth daily. 06/25/21  Yes [provider]  gabapentin  (NEURONTIN ) 300 MG capsule Take 1 capsule (300 mg total) by mouth at bedtime. Patient taking differently: Take 600 mg by mouth in the morning and at bedtime. [facility] doctor- morning and night 06/28/19  Yes Jones, Deanna C, MD  ipratropium (ATROVENT ) 0.06 % nasal spray Place 2 sprays into both nostrils 4 (four) times daily. 05/14/24  Yes Van Knee, MD  Misc Natural Products (GLUCOSAMINE CHOND COMPLEX/MSM PO) Take 1 tablet by mouth daily.   Yes [provider]  Multiple Vitamins-Minerals (OCUVITE ADULT 50+ PO) Take 1 tablet by mouth in the morning and at bedtime.   Yes [provider]  Simethicone  (GAS RELIEF 125 MAX ST PO) Take 2 tablets by mouth 2 (two) times daily.   Yes [provider]  simvastatin  (ZOCOR ) 20 MG tablet Take 1 tablet (20 mg total) by mouth at bedtime. 06/19/23  Yes Jones, Deanna C, MD  solifenacin (VESICARE) 10 MG tablet Take 1 tablet by mouth daily. 09/20/23 09/19/24 Yes [provider]  vitamin E 400 UNIT capsule Take 400 Units by mouth daily.   Yes [provider]     Family History Family History  Problem Relation Age of Onset   Heart disease Mother    Colon cancer Mother    Hypertension Mother    Heart disease Father    Hypertension Father    Breast cancer Neg Hx     Social History Social History[1]   Allergies   Aspirin, Atorvastatin , Macrobid [nitrofurantoin], Nsaids, and Sulfa antibiotics   Review of Systems Review of Systems  Constitutional:  Negative for fever.  Eyes:  Negative for visual disturbance.  Cardiovascular:  Negative for chest pain and palpitations.  Gastrointestinal:  Negative for nausea and vomiting.  Skin:  Positive for wound.  Neurological:  Negative for dizziness, syncope, facial asymmetry and headaches.     Physical Exam Triage Vital Signs ED Triage Vitals  Encounter Vitals Group     BP  Girls Systolic BP Percentile      Girls Diastolic BP Percentile      Boys Systolic BP Percentile      Boys Diastolic BP Percentile      Pulse      Resp      Temp      Temp src      SpO2      Weight      Height      Head Circumference      Peak Flow      Pain Score      Pain Loc      Pain Education      Exclude from Growth Chart    No data found.  Updated Vital Signs BP 111/63 (BP Location: Left Arm)   Pulse 91   Temp 97.9 F (36.6 C) (Oral)   Wt 157 lb 9.6 oz (71.5 kg)   SpO2 96%   BMI 25.44 kg/m   Visual Acuity Right Eye Distance:   Left Eye Distance:   Bilateral Distance:    Right Eye Near:   Left Eye Near:    Bilateral Near:     Physical Exam Vitals and nursing note reviewed.  Constitutional:      Appearance: Normal appearance. She is not ill-appearing.  HENT:     Head: Normocephalic.  Eyes:     General: No scleral icterus.       Right eye: No discharge.        Left eye: No discharge.     Extraocular Movements: Extraocular movements intact.     Conjunctiva/sclera: Conjunctivae normal.     Pupils: Pupils are equal, round, and reactive to light.  Musculoskeletal:         General: Swelling, tenderness and signs of injury present.  Skin:    General: Skin is warm and dry.     Capillary Refill: Capillary refill takes less than 2 seconds.     Findings: Bruising present.  Neurological:     General: No focal deficit present.     Mental Status: She is alert and oriented to person, place, and time.      UC Treatments / Results  Labs (all labs ordered are listed, but only abnormal results are displayed) Labs Reviewed - No data to display  EKG   Radiology CT Head Wo Contrast Result Date: 05/16/2024 CLINICAL DATA:  Fall with head trauma. EXAM: CT HEAD WITHOUT CONTRAST TECHNIQUE: Contiguous axial images were obtained from the base of the skull through the vertex without intravenous contrast. RADIATION DOSE REDUCTION: This exam was performed according to the departmental dose-optimization program which includes automated exposure control, adjustment of the mA and/or kV according to patient size and/or use of iterative reconstruction technique. COMPARISON:  None Available. FINDINGS: Brain: No evidence of acute infarction, hemorrhage, hydrocephalus, extra-axial collection or mass lesion/mass effect. Minimal chronic ischemic microvascular disease. Vascular: No hyperdense vessel or unexpected calcification. Skull: Normal. Negative for fracture or focal lesion. Sinuses/Orbits: No acute finding. Other: None. IMPRESSION: 1. No acute findings. 2. Minimal chronic ischemic microvascular disease. Electronically Signed   By: Toribio Agreste M.D.   On: 05/16/2024 13:10    Procedures Procedures (including critical care time)  Medications Ordered in UC Medications - No data to display  Initial Impression / Assessment and Plan / UC Course  I have reviewed the triage vital signs and the nursing notes.  Pertinent labs & imaging results that were available during my care of the patient were reviewed by  me and considered in my medical decision making (see chart for details).    Patient is a nontoxic-appearing 78 year old female presenting for evaluation of a head injury status post a fall.  She reports that she was sitting at her desk chair and she started to become drowsy due to using Magic mouthwash.  She reports that she has been using it 4 times a day for sore throat pain after being diagnosed with RSV 2 days ago.  She took 12.5 mg of Benadryl orally, fell asleep, and fell out of her office chair onto a laminate floor.  She states that she woke up immediately when she hit the floor.  She has a small superficial laceration above her right eyebrow with swelling and ecchymosis.  The laceration is superficial and it does approximate well.  She is tender on her supraorbital rim as well as to the lateral aspect of the infraorbital rim without crepitus.  Pupils equal round reactive and EOM's intact.  Cranial nerves II through XII are intact.  She is moving all extremities independently.  The patient also has a skin tear over her left knee as a result of her ground-level fall this morning.  No tenderness with palpation of the patella, medial lateral joint line of the knee, or over the tibial tuberosity.  DP and PT pulses are 2+.  I will have staff clean the wound and dress it with a nonadherent dressing and bacitracin ointment.  The patient also has a bruise to her right shin that she sustained after tripping over her cane last night.  The area is ecchymosis is tender to palpation but there is no indication of seroma formation.  Also no bony crepitus.  DP and PT pulses are 2+ in her right foot.  She denies any pain in either lower extremity with weightbearing and she is able to walk with her cane.  I will obtain a CT scan of the patient's head and face to rule out any intracranial process or injury to the facial bones.    I cleansed the superficial laceration above the left eyebrow with wound cleanser and gauze.  I then applied a thin smear of Dermabond to close the superficial  laceration/skin tear.  Patient tolerated procedure well.  CT head does not require prior authorization but the CT maxillofacial did require prior authorization.  Unable to obtain prior Auth at this time.  I will cancel the facial CT and continue with the head CT.  CT head independently reviewed and evaluated by me.  Impression: No evidence of orbital or nasal fracture.  No acute intracranial process noted.  No mass lesion.  Radiology overread is pending. Radiology impression states no acute findings.  Minimal chronic ischemic microvascular disease.  I will discharge patient home with a diagnosis of fall with a closed head injury.  I advised her to stop using the Magic mouthwash as a feel of the Benadryl is over sedating her and that is what is led to her multiple falls in the last several days.  She may use over-the-counter Chloraseptic or Sucrets lozenges as needed for any sore throat pain.  She may use over-the-counter Tylenol  according to the package instructions as needed for any fever or pain.  She may apply ice to all areas of pain for 20 at a time, 2-3 times a day, to help with pain and inflammation.  I will place her on Keflex  500 mg 3 times a day x 7 days to prevent infection as result of  her skin tear on her left knee.   Final Clinical Impressions(s) / UC Diagnoses   Final diagnoses:  Closed head injury, initial encounter  Fall, initial encounter  Skin tear of left lower leg without complication, initial encounter  Contusion of right lower leg, initial encounter     Discharge Instructions      Your head CT did not show any evidence of broken bones to your orbit or skull and did not show any evidence of acute process in the brain as a result of your fall.  Please stop using the Magic mouthwash as I feel the Benadryl is over sedating you and that it is possibly led to your more recent falls.  You may use over-the-counter Tylenol  according to the package instructions as needed for  any pain.  You may apply ice to all the areas of pain for 20 minutes at a time, 2-3 times a day, to help with pain and swelling.  Make sure you have a cloth between the ice and your skin so as to not cause skin damage.  Keep the skin tear on your left knee clean and dry.  You may apply over-the-counter bacitracin ointment and nonstick gauze for the next several days until a scab has formed.  Once the scab has formed stop applying the ointment and leave the wound open to air when you are at home and cover with a Band-Aid when you go out in public.  Take the Keflex  500 mg 3 times a day with food to prevent infection as a result of your recent fall and skin tear on your left knee.  If you develop any redness, swelling, pus drainage from the skin tear, red streaks up your leg, or fever please return for reevaluation.  If you develop any headache, changes in your vision, dizziness, forceful nausea and vomiting, or changes in behavior please call 911 and go to the ER for evaluation.     ED Prescriptions     Medication Sig Dispense Auth. Provider   cephALEXin  (KEFLEX ) 500 MG capsule Take 1 capsule (500 mg total) by mouth 3 (three) times daily for 7 days. 21 capsule Bernardino Ditch, NP      PDMP not reviewed this encounter.     [1]  Social History Tobacco Use   Smoking status: Never   Smokeless tobacco: Never  Vaping Use   Vaping status: Never Used  Substance Use Topics   Alcohol use: Never   Drug use: Never     Bernardino Ditch, NP 05/16/24 1318

## 2024-05-16 NOTE — Discharge Instructions (Addendum)
 Your head CT did not show any evidence of broken bones to your orbit or skull and did not show any evidence of acute process in the brain as a result of your fall.  Please stop using the Magic mouthwash as I feel the Benadryl is over sedating you and that it is possibly led to your more recent falls.  You may use over-the-counter Tylenol  according to the package instructions as needed for any pain.  You may apply ice to all the areas of pain for 20 minutes at a time, 2-3 times a day, to help with pain and swelling.  Make sure you have a cloth between the ice and your skin so as to not cause skin damage.  Keep the skin tear on your left knee clean and dry.  You may apply over-the-counter bacitracin ointment and nonstick gauze for the next several days until a scab has formed.  Once the scab has formed stop applying the ointment and leave the wound open to air when you are at home and cover with a Band-Aid when you go out in public.  Take the Keflex  500 mg 3 times a day with food to prevent infection as a result of your recent fall and skin tear on your left knee.  If you develop any redness, swelling, pus drainage from the skin tear, red streaks up your leg, or fever please return for reevaluation.  If you develop any headache, changes in your vision, dizziness, forceful nausea and vomiting, or changes in behavior please call 911 and go to the ER for evaluation.

## 2024-05-17 LAB — CULTURE, GROUP A STREP (THRC)

## 2024-06-18 ENCOUNTER — Telehealth: Payer: Self-pay

## 2024-06-18 ENCOUNTER — Ambulatory Visit: Admitting: Family Medicine

## 2024-06-18 ENCOUNTER — Encounter: Payer: Self-pay | Admitting: Family Medicine

## 2024-06-18 ENCOUNTER — Other Ambulatory Visit: Payer: Self-pay

## 2024-06-18 ENCOUNTER — Other Ambulatory Visit (INDEPENDENT_AMBULATORY_CARE_PROVIDER_SITE_OTHER): Admitting: Radiology

## 2024-06-18 VITALS — BP 110/66 | HR 93 | Ht 66.0 in | Wt 156.0 lb

## 2024-06-18 DIAGNOSIS — M19049 Primary osteoarthritis, unspecified hand: Secondary | ICD-10-CM

## 2024-06-18 DIAGNOSIS — M1611 Unilateral primary osteoarthritis, right hip: Secondary | ICD-10-CM

## 2024-06-18 DIAGNOSIS — M180 Bilateral primary osteoarthritis of first carpometacarpal joints: Secondary | ICD-10-CM | POA: Diagnosis not present

## 2024-06-18 MED ORDER — AMITRIPTYLINE HCL 10 MG PO TABS: 10.0000 mg | ORAL_TABLET | Freq: Every day | ORAL | 2 refills | Status: AC

## 2024-06-18 MED ORDER — AMITRIPTYLINE HCL 10 MG PO TABS: 10.0000 mg | ORAL_TABLET | Freq: Every day | ORAL | 2 refills | Status: DC

## 2024-06-18 MED ORDER — METHYLPREDNISOLONE ACETATE 40 MG/ML IJ SUSP
40.0000 mg | Freq: Once | INTRAMUSCULAR | Status: AC
  Administered 2024-06-18: 40 mg via INTRAMUSCULAR

## 2024-06-18 NOTE — Progress Notes (Signed)
 "    Primary Care / Sports Medicine Office Visit  Patient Information:  Patient ID: Melissa Daniels, female DOB: 1945-07-20 Age: 79 y.o. MRN: [id number]   Melissa Daniels is a pleasant 79 y.o. female presenting with the following:  Chief Complaint  Patient presents with   Hand Pain    Patient would like cortisone injection for bil CMC joint.    Vitals:   06/18/24 1021  BP: 110/66  Pulse: 93  SpO2: 96%   Vitals:   06/18/24 1021  Weight: 156 lb (70.8 kg)  Height: 5' 6 (1.676 m)   Body mass index is 25.18 kg/m.  No results found.   Independent interpretation of notes and tests performed by another provider:   None  Procedures performed:   Procedure:  Injection of left hand under ultrasound guidance. Ultrasound guidance utilized for 1st [facility] injection Samsung HS60 device utilized with permanent recording / reporting. Verbal informed consent obtained and verified. Skin prepped in a sterile fashion. Ethyl chloride for topical local analgesia.  Completed without difficulty and tolerated well. Medication: triamcinolone  acetonide 40 mg/mL suspension for injection 0.25 mL total and 0.25 mL lidocaine  1% without epinephrine  utilized for needle placement anesthetic Advised to contact for fevers/chills, erythema, induration, drainage, or persistent bleeding.  Procedure:  Injection of right hand under ultrasound guidance. Ultrasound guidance utilized for out-plane-plane approach to 1st [facility] Samsung HS60 device utilized with permanent recording / reporting. Verbal informed consent obtained and verified. Skin prepped in a sterile fashion. Ethyl chloride for topical local analgesia.  Completed without difficulty and tolerated well. Medication: triamcinolone  acetonide 40 mg/mL suspension for injection 0.25 mL total and 0.25 mL lidocaine  1% without epinephrine  utilized for needle placement anesthetic Advised to contact for fevers/chills, erythema, induration, drainage, or persistent  bleeding.   Pertinent History, Exam, Impression, and Recommendations:   Discussed the use of AI scribe software for clinical note transcription with the patient, who gave verbal consent to proceed.  History of Present Illness   Melissa Daniels is a 79 year old female with bilateral thumb and right hip osteoarthritis who presents with recurrent pain in the thumbs and right hip.  Thumb Pain and Dysfunction: - Recurrent pain at the base of both thumbs since late December 2025, with gradual worsening over the past week - Pain is most severe in the right thumb, which is her dominant hand - Received cortisone injections to both thumbs on February 27, 2024, resulting in approximately three months of symptomatic relief - Uses aspirin cream spray for convenience and Tylenol  500 mg in the morning, with a total of three doses on the day of the visit - Maintained on duloxetine and gabapentin  600 mg twice daily, which generally provide adequate pain control between injections - Pain intensifies as the effect of cortisone injections diminishes - No sleep disturbance; sleep described as 'wonderful' - No use of opioid analgesics prior to procedures; has taken extra Tylenol  before injections as needed  Right Hip Pain and Instability: - Sharp right hip pain with associated instability - Sensation that her leg may 'give way' when standing or turning - Uses a cane on the right side to offload weight and improve balance - Expresses interest in hip exercises to improve function  Other Musculoskeletal and Systemic Symptoms: - Recent sore throat attributed to a viral illness, with no ongoing symptoms - No additional musculoskeletal complaints aside from the thumbs and right hip       Assessment and Plan    Primary osteoarthritis of bilateral  carpometacarpal joints Chronic osteoarthritis with recurrent pain and functional limitation. Current regimen includes topical analgesics, acetaminophen , duloxetine, and  gabapentin . Non-surgical options discussed but deferred. - Administered corticosteroid injections to bilateral first carpometacarpal joints with cold spray for local anesthesia. - Advised use of ice and acetaminophen  for post-injection discomfort, not to exceed 4000 mg acetaminophen  daily. - Reviewed topical analgesic use; instructed to leave on for 15 minutes before washing hands. - Discussed option of premedication with stronger analgesics (e.g., Percocet) prior to future injections if procedural pain is a barrier; instructed to notify in advance if desired. - Deferred low-dose radiation therapy for refractory symptoms. - Reinforced continued use of cane for hand and hip support.  Primary osteoarthritis of right hip Chronic osteoarthritis with increased pain and instability, impacting mobility and increasing fall risk. Current regimen includes duloxetine and gabapentin . Amitriptyline  selected as adjunct for pain management due to low risk of drug interactions. - Prescribed low-dose amitriptyline  at bedtime as adjunct for chronic pain management; instructed to monitor for drowsiness and adjust timing as needed. - Provided printed hip exercises for home therapy. - Instructed to use cane for ambulation to reduce fall risk and offload the hip joint. - Advised to monitor response to amitriptyline  over one month; if inadequate pain control, will consider corticosteroid injection to the right hip at follow-up. - Instructed to contact office if pain persists or worsens, or if refills are needed for amitriptyline . - Discussed that amitriptyline  does not affect platelets and is low risk for drug interactions in her case. - Reinforced that hip injection, if needed, is generally well tolerated with less discomfort than hand injections.        Assessment & Plan Localized primary osteoarthritis of carpometacarpal (CMC) joint, unspecified laterality  Orders:   US  LIMITED JOINT SPACE STRUCTURES UP BILAT;  Future   methylPREDNISolone  acetate (DEPO-MEDROL ) injection 40 mg   amitriptyline  (ELAVIL ) 10 MG tablet; Take 1 tablet (10 mg total) by mouth at bedtime.  Primary osteoarthritis of right hip  Orders:   amitriptyline  (ELAVIL ) 10 MG tablet; Take 1 tablet (10 mg total) by mouth at bedtime.   No follow-ups on file.     Melissa JINNY Ku, MD, [facility]   Primary Care Sports Medicine Primary Care and Sports Medicine at [facility]   "

## 2024-06-18 NOTE — Telephone Encounter (Signed)
 Patient needs PA for amitriptyline  (ELAVIL ) 10 MG tablet   Thank you  JM

## 2024-06-18 NOTE — Assessment & Plan Note (Signed)
" °  Orders:   US  LIMITED JOINT SPACE STRUCTURES UP BILAT; Future   methylPREDNISolone  acetate (DEPO-MEDROL ) injection 40 mg   amitriptyline  (ELAVIL ) 10 MG tablet; Take 1 tablet (10 mg total) by mouth at bedtime.  "

## 2024-06-18 NOTE — Patient Instructions (Signed)
" °  VISIT SUMMARY: Today, we addressed your recurrent thumb and right hip pain due to osteoarthritis. You received cortisone injections in both thumbs and were prescribed amitriptyline  for your hip pain. We also discussed pain management strategies and exercises to improve your hip function.  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 -You received corticosteroid injections in both thumbs to help with the pain. -Use ice and acetaminophen  for any discomfort after the injections, but do not exceed 4000 mg of acetaminophen  daily. -Continue using topical analgesics as instructed, leaving them on for 15 minutes before washing your hands. -If you need stronger pain relief before future injections, let us  know in advance so we can discuss options like Percocet. -Keep using your cane to support your hands and hips.  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 -You were prescribed low-dose amitriptyline  to take at bedtime for pain management. Monitor for drowsiness and adjust the timing if needed. -Follow the printed hip exercises provided to improve your hip function. -Continue using your cane to reduce the risk of falls and offload your hip joint. -Monitor your response to amitriptyline  over the next month. If the pain is not well controlled, we may consider a corticosteroid injection in your hip at your next visit. -Contact our office if your pain persists or worsens, or if you need refills for amitriptyline . -Remember that hip injections, if needed, are generally well tolerated and less uncomfortable than hand injections.    Contains text generated by [employer].   "

## 2024-06-18 NOTE — Assessment & Plan Note (Signed)
 Orders:    amitriptyline  (ELAVIL ) 10 MG tablet; Take 1 tablet (10 mg total) by mouth at bedtime.

## 2024-06-18 NOTE — Telephone Encounter (Signed)
 Copied from CRM [id number]. Topic: Clinical - Prescription Issue >> Jun 18, 2024  2:38 PM Joesph NOVAK wrote: Reason for CRM: prescription sent to wrong pharmacy amitriptyline  (ELAVIL ) 10 MG tablet, please send it to this one.   [facility] - [place], [place] - [address] [place] [address] Phone: [phone] Fax: [phone] Hours: Not open 24 hours

## 2024-06-18 NOTE — Telephone Encounter (Signed)
 Copied from CRM [id number]. Topic: Clinical - Medication Prior Auth >> Jun 18, 2024  3:28 PM Melissa Daniels wrote: Reason for CRM: PT called in saying that medication needs a PA and pt would like it done before the day ends because she has a ride in the morning to go pick up .     amitriptyline  (ELAVIL ) 10 MG tablet - please follow up with pt

## 2024-06-19 ENCOUNTER — Telehealth: Payer: Self-pay | Admitting: Pharmacy Technician

## 2024-06-19 ENCOUNTER — Other Ambulatory Visit (HOSPITAL_COMMUNITY): Payer: Self-pay

## 2024-06-19 NOTE — Telephone Encounter (Signed)
 PA request has been Started. New Encounter has been or will be created for follow up. For additional info see Pharmacy Prior Auth telephone encounter from 06/19/24.

## 2024-06-19 NOTE — Telephone Encounter (Signed)
 Pharmacy Patient Advocate Encounter  Received notification from [employer] that Prior Authorization for Amitriptyline  HCl 10MG  tablets has been APPROVED from 05/30/24 to 05/29/25. Ran test claim, Copay is $4.80 for 3 months. This test claim was processed through [facility]- copay amounts may vary at other pharmacies due to pharmacy/plan contracts, or as the patient moves through the different stages of their insurance plan.   PA #/Case ID/Reference #: [id number]

## 2024-06-19 NOTE — Telephone Encounter (Signed)
 Pharmacy Patient Advocate Encounter   Received notification from RX Request Messages that prior authorization for Amitriptyline  HCl 10MG  tablets is required/requested.   Insurance verification completed.   The patient is insured through [employer].   Per test claim: PA required; PA started via CoverMyMeds. KEY [id number] . Waiting for clinical questions to populate.

## 2024-06-21 ENCOUNTER — Other Ambulatory Visit (HOSPITAL_COMMUNITY): Payer: Self-pay

## 2024-07-01 ENCOUNTER — Other Ambulatory Visit (HOSPITAL_COMMUNITY): Payer: Self-pay
